# Patient Record
Sex: Female | Born: 1985 | Race: Black or African American | Hispanic: No | Marital: Single | Smoking: Never smoker
Health system: Southern US, Community
[De-identification: ages and names within clinical notes are randomized; demographics above are authoritative.]

## PROBLEM LIST (undated history)

## (undated) DIAGNOSIS — R6881 Early satiety: Secondary | ICD-10-CM

## (undated) DIAGNOSIS — L910 Hypertrophic scar: Secondary | ICD-10-CM

## (undated) DIAGNOSIS — R61 Generalized hyperhidrosis: Secondary | ICD-10-CM

## (undated) DIAGNOSIS — A6000 Herpesviral infection of urogenital system, unspecified: Secondary | ICD-10-CM

## (undated) DIAGNOSIS — D649 Anemia, unspecified: Secondary | ICD-10-CM

## (undated) DIAGNOSIS — A630 Anogenital (venereal) warts: Secondary | ICD-10-CM

## (undated) DIAGNOSIS — D509 Iron deficiency anemia, unspecified: Secondary | ICD-10-CM

## (undated) DIAGNOSIS — D696 Thrombocytopenia, unspecified: Secondary | ICD-10-CM

## (undated) DIAGNOSIS — R634 Abnormal weight loss: Secondary | ICD-10-CM

## (undated) DIAGNOSIS — R87629 Unspecified abnormal cytological findings in specimens from vagina: Secondary | ICD-10-CM

## (undated) HISTORY — DX: Iron deficiency anemia, unspecified: D50.9

## (undated) HISTORY — DX: Thrombocytopenia, unspecified: D69.6

## (undated) HISTORY — DX: Early satiety: R68.81

## (undated) HISTORY — DX: Generalized hyperhidrosis: R61

## (undated) HISTORY — DX: Abnormal weight loss: R63.4

## (undated) HISTORY — DX: Anemia, unspecified: D64.9

## (undated) HISTORY — PX: WISDOM TOOTH EXTRACTION: SHX21

---
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
## 2006-04-08 ENCOUNTER — Emergency Department (HOSPITAL_COMMUNITY): Admission: EM | Admit: 2006-04-08 | Discharge: 2006-04-09 | Payer: Self-pay | Admitting: Emergency Medicine

## 2007-04-10 ENCOUNTER — Ambulatory Visit: Payer: Self-pay | Admitting: Family Medicine

## 2007-04-10 DIAGNOSIS — Z9189 Other specified personal risk factors, not elsewhere classified: Secondary | ICD-10-CM | POA: Insufficient documentation

## 2007-04-10 HISTORY — DX: Other specified personal risk factors, not elsewhere classified: Z91.89

## 2007-04-13 ENCOUNTER — Encounter: Payer: Self-pay | Admitting: Family Medicine

## 2007-06-09 ENCOUNTER — Ambulatory Visit: Payer: Self-pay | Admitting: Family Medicine

## 2007-11-08 ENCOUNTER — Ambulatory Visit: Payer: Self-pay | Admitting: Family Medicine

## 2011-03-11 ENCOUNTER — Telehealth: Payer: Self-pay | Admitting: Family Medicine

## 2011-03-11 NOTE — Telephone Encounter (Signed)
Pts mom called. Pt was last seen in 2009 and is req to re-est. Pt has been having lower back pain for 2 days. Pls advise if ok to work in 30 min ov to re-est, or just a 15 min acute?

## 2011-03-11 NOTE — Telephone Encounter (Signed)
Okay for her to see me tomorrow afternoon for this. Give [place] 30 minutes please

## 2011-03-11 NOTE — Telephone Encounter (Signed)
Called pts mom and schd 30 min ov for pt on Friday 03/12/11 at 1:30 pm as noted per Dr Clent Ridges.

## 2011-03-12 ENCOUNTER — Encounter: Payer: Self-pay | Admitting: Family Medicine

## 2011-03-12 ENCOUNTER — Ambulatory Visit (INDEPENDENT_AMBULATORY_CARE_PROVIDER_SITE_OTHER): Payer: Managed Care, Other (non HMO) | Admitting: Family Medicine

## 2011-03-12 VITALS — BP 118/72 | HR 78 | Temp 98.8°F | Ht 61.0 in | Wt 113.0 lb

## 2011-03-12 DIAGNOSIS — M545 Low back pain: Secondary | ICD-10-CM

## 2011-03-12 MED ORDER — DICLOFENAC SODIUM 75 MG PO TBEC: 75.0000 mg | DELAYED_RELEASE_TABLET | Freq: Two times a day (BID) | ORAL | Status: DC | PRN

## 2011-03-12 NOTE — Progress Notes (Signed)
  Subjective:    Patient ID: Melinda [name], female    DOB: 08-11-1985, 26 y.o.   MRN: [id number]  HPI 26 yr old female to re-establish after an absence of 4 years. She is complaining of lower back pain for the past 2 weeks. This started rather suddenly, and she does not know what may have caused it. She is not working out and she has had no trauma. No UTI symptoms. She has done nothing for it. She is a Holiday representative at [employer] and she will be graduating soon with a degree in childhood development. 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    Review of Systems  Constitutional: Negative.   Respiratory: Negative.   Cardiovascular: Negative.   Gastrointestinal: Negative.   Genitourinary: Negative.   Musculoskeletal: Positive for back pain.       Objective:   Physical Exam  Constitutional: She appears well-developed and well-nourished.  Cardiovascular: Normal rate, regular rhythm, normal heart sounds and intact distal pulses.   Pulmonary/Chest: Effort normal and breath sounds normal.  Abdominal: Soft. Bowel sounds are normal. She exhibits no distension and no mass. There is no tenderness. There is no rebound and no guarding.  Musculoskeletal:       Tender in the upper lumbar area over the spine. Flexion and extension are limited by pain, but lateral and rotational ROM is full           Assessment & Plan:  Muscular low back pain. Use heat and Diclofenac. Suggested she do Pilates or core strengthening exercises. Recheck  prn

## 2011-10-29 ENCOUNTER — Ambulatory Visit (INDEPENDENT_AMBULATORY_CARE_PROVIDER_SITE_OTHER): Payer: Managed Care, Other (non HMO) | Admitting: Family Medicine

## 2011-10-29 ENCOUNTER — Encounter: Payer: Self-pay | Admitting: Family Medicine

## 2011-10-29 VITALS — BP 100/62 | HR 101 | Temp 99.7°F | Wt 110.0 lb

## 2011-10-29 DIAGNOSIS — B373 Candidiasis of vulva and vagina: Secondary | ICD-10-CM

## 2011-10-29 MED ORDER — FLUCONAZOLE 150 MG PO TABS: 150.0000 mg | ORAL_TABLET | Freq: Once | ORAL | Status: DC

## 2011-10-29 NOTE — Progress Notes (Signed)
  Subjective:    Patient ID: Melinda [name], female    DOB: Dec 03, 1985, 26 y.o.   MRN: [id number]  HPI Here for 2 days of itching around the perineal area. No vaginal DC or odor. Her LMP was 3 weeks ago. No fever or cramps. Her last Pap smear was at [facility] in October 2012.    Review of Systems  Constitutional: Negative.   Genitourinary: Negative for vaginal bleeding, vaginal discharge, vaginal pain, menstrual problem and pelvic pain.       Objective:   Physical Exam  Constitutional: She appears well-developed and well-nourished.  Genitourinary:       The labia and introitus are red and a bit inflamed but no DC is seen, no rashes, no odor           Assessment & Plan:  Candidiasis. Use Diflucan and OTC Monistat prn. She will set up a Pap smear soon.

## 2011-12-15 ENCOUNTER — Ambulatory Visit: Payer: Managed Care, Other (non HMO) | Admitting: Family Medicine

## 2011-12-23 ENCOUNTER — Encounter: Payer: Self-pay | Admitting: Obstetrics and Gynecology

## 2011-12-23 ENCOUNTER — Ambulatory Visit (INDEPENDENT_AMBULATORY_CARE_PROVIDER_SITE_OTHER): Payer: Commercial Indemnity | Admitting: Obstetrics and Gynecology

## 2011-12-23 VITALS — BP 110/70 | HR 68 | Ht 61.0 in | Wt 111.0 lb

## 2011-12-23 DIAGNOSIS — Z01419 Encounter for gynecological examination (general) (routine) without abnormal findings: Secondary | ICD-10-CM

## 2011-12-23 DIAGNOSIS — Z124 Encounter for screening for malignant neoplasm of cervix: Secondary | ICD-10-CM

## 2011-12-23 MED ORDER — DESOGESTREL-ETHINYL ESTRADIOL 0.15-30 MG-MCG PO TABS: 1.0000 | ORAL_TABLET | Freq: Every day | ORAL | Status: DC

## 2011-12-23 NOTE — Progress Notes (Signed)
Regular Periods: yes Mammogram: no  Monthly Breast Ex.: yes Exercise: no  Tetanus < 10 years: yes Seatbelts: yes  NI. Bladder Functn.: yes Abuse at home: no  Daily BM's: yes Stressful Work: no  Healthy Diet: yes Sigmoid-Colonoscopy: NO  Calcium: no Medical problems this year: NONE   LAST PAP:12/12   NL  Contraception: DESOGEN  Mammogram:  NO   PCP: DR. Clent Ridges  PMH: NO CHANGE  FMH: NO CHANGE  Last Bone Scan: NO  PT IS SINGLE

## 2011-12-23 NOTE — Patient Instructions (Addendum)
Take Aleve  2 with food twice a day starting the day before cramps are expected OR   Ibuprofen 200 mg  # 3 tablets with food every 6 hours to prevent cramps

## 2011-12-23 NOTE — Progress Notes (Signed)
Subjective:    Melinda [name] is a 26 y.o. female, G0P0, who presents for an annual exam. The patient reports seeing Dr. Campbell Stall for dermatologic issues (acne) and was placed in Desogen last month.    Menstrual cycle:   LMP: Patient's last menstrual period was 12/06/2011. Flow 7 days with pad change five times a day; cramps 8/10 starting 2 days before period and 1 day after period with no relief from OTC analgesia             Review of Systems Pertinent items are noted in HPI. Denies pelvic pain, urinary tract symptoms, vaginitis symptoms, irregular bleeding, menopausal symptoms, change in bowel habits or rectal bleeding   Objective:    BP 110/70  Pulse 68  Ht 5\' 1"  (1.549 m)  Wt 111 lb (50.349 kg)  BMI 20.97 kg/m2  LMP 12/06/2011   Wt Readings from Last 1 Encounters:  12/23/11 111 lb (50.349 kg)   Body mass index is 20.97 kg/(m^2). General Appearance: Alert, no acute distress HEENT: Grossly normal Neck / Thyroid: Supple, no thyromegaly or cervical adenopathy Lungs: Clear to auscultation bilaterally Back: No CVA tenderness Breast Exam: No masses or nodes.No dimpling, nipple retraction or discharge. Cardiovascular: Regular rate and rhythm.  Gastrointestinal: Soft, non-tender, no masses or organomegaly Pelvic Exam: EGBUS-wnl, vagina-normal rugae, cervix- posterior without lesions or tenderness, uterus appears normal size shape and consistency, adnexae-no masses or tenderness Lymphatic Exam: Non-palpable nodes in neck, clavicular,  axillary, or inguinal regions  Skin: no rashes or abnormalities Extremities: no clubbing cyanosis or edema  Neurologic: grossly normal Psychiatric: Alert and oriented   Assessment:   Routine GYN Exam Dysmenorrhea    Plan:    PAP sent  Continue Desogen #1 1 po qd 11 refills  RTO 1 year or prn  Mikkel Charrette,ELMIRAPA-C

## 2011-12-25 LAB — PAP IG, CT-NG, RFX HPV ASCU
Chlamydia Probe Amp: NEGATIVE
GC Probe Amp: NEGATIVE

## 2012-01-07 ENCOUNTER — Telehealth: Payer: Self-pay | Admitting: Obstetrics and Gynecology

## 2012-01-07 NOTE — Telephone Encounter (Signed)
Tc to pt per telephone call. Informed pt pap smear-wnl. GC/CT-both neg. Pt agrees.

## 2012-06-17 ENCOUNTER — Emergency Department (HOSPITAL_COMMUNITY)
Admission: EM | Admit: 2012-06-17 | Discharge: 2012-06-17 | Disposition: A | Discharge status: Home / Self Care | Payer: BC Managed Care – PPO | Source: Home / Self Care | Attending: Emergency Medicine | Admitting: Emergency Medicine

## 2012-06-17 ENCOUNTER — Encounter (HOSPITAL_COMMUNITY): Payer: Self-pay | Admitting: Emergency Medicine

## 2012-06-17 DIAGNOSIS — R6883 Chills (without fever): Secondary | ICD-10-CM

## 2012-06-17 NOTE — ED Notes (Signed)
Waiting discharge papers 

## 2012-06-17 NOTE — ED Provider Notes (Signed)
History     CSN: [id number]  Arrival date & time 06/17/12  1141   First MD Initiated Contact with Patient 06/17/12 1210      Chief Complaint  Patient presents with  . Chills    low grade temp. hot cold chills. fatigue    (Consider location/radiation/quality/duration/timing/severity/associated sxs/prior treatment) HPI Comments: Pt presents c/o subjective fever/chills and lack of appetite since yesterday.  She states that she can eat but is just not hungry and could only eat half of her biscuit this AM.  Denies abdominal pain, measured fever, sore throat, cough, CP, or any and all other symptoms.  She has not tried to take any OTC medicine to feel better.     Past Medical History  Diagnosis Date  . Chickenpox     History reviewed. No pertinent past surgical history.  Family History  Problem Relation Age of Onset  . Hypertension Maternal Grandmother   . Diabetes Maternal Grandmother   . Hypertension Mother   . Hypertension Maternal Uncle     History  Substance Use Topics  . Smoking status: Never Smoker   . Smokeless tobacco: Never Used  . Alcohol Use: No    OB History   Grav Para Term Preterm Abortions TAB SAB Ect Mult Living   0               Review of Systems  Constitutional: Positive for fever, chills and appetite change.  Eyes: Negative for visual disturbance.  Respiratory: Negative for cough and shortness of breath.   Cardiovascular: Negative for chest pain, palpitations and leg swelling.  Gastrointestinal: Negative for nausea, vomiting and abdominal pain.  Endocrine: Negative for polydipsia and polyuria.  Genitourinary: Negative for dysuria, urgency and frequency.  Musculoskeletal: Negative for myalgias and arthralgias.  Skin: Negative for rash.  Neurological: Negative for dizziness, weakness and light-headedness.    Allergies  Review of patient's allergies indicates no known allergies.  Home Medications   Current Outpatient Rx  Name  Route  Sig   Dispense  Refill  . desogestrel-ethinyl estradiol (APRI,EMOQUETTE,SOLIA) 0.15-30 MG-MCG tablet   Oral   Take 1 tablet by mouth daily.   1 Package   11   . desogestrel-ethinyl estradiol (APRI,EMOQUETTE,SOLIA) 0.15-30 MG-MCG tablet   Oral   Take 1 tablet by mouth daily.           BP 116/77  Pulse 108  Temp(Src) 99.6 F (37.6 C) (Oral)  Resp 19  SpO2 100%  LMP 05/17/2012  Physical Exam  Nursing note and vitals reviewed. Constitutional: She is oriented to person, place, and time. Vital signs are normal. She appears well-developed and well-nourished. No distress.  HENT:  Head: Atraumatic.  Eyes: EOM are normal. Pupils are equal, round, and reactive to light.  Cardiovascular: Normal rate, regular rhythm and normal heart sounds.  Exam reveals no gallop and no friction rub.   No murmur heard. Pulmonary/Chest: Effort normal and breath sounds normal. No respiratory distress. She has no wheezes. She has no rales.  Abdominal: Soft. There is no tenderness.  Neurological: She is alert and oriented to person, place, and time. She has normal strength.  Skin: Skin is warm and dry. She is not diaphoretic.  Psychiatric: She has a normal mood and affect. Her behavior is normal. Judgment normal.    ED Course  Procedures (including critical care time)  Labs Reviewed - No data to display No results found.   1. Chills (without fever)       MDM  PE completely normal.  Mild viral syndrome.  Pt will take tylenol PRN and f/u if anything changes.          Graylon Good, PA-C 06/17/12 1249

## 2012-06-17 NOTE — ED Notes (Signed)
Pt reports low grade temp, hot cold chills and fatigue since yesterday.  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  Denies any other symptoms. Pt has not tried any otc meds for treatment.

## 2012-06-17 NOTE — ED Provider Notes (Signed)
Medical screening examination/treatment/procedure(s) were performed by non-physician practitioner and as supervising physician I was immediately available for consultation/collaboration.  Leslee Home, M.D.  Reuben Likes, MD 06/17/12 [phone]

## 2012-06-21 ENCOUNTER — Encounter: Payer: Self-pay | Admitting: Family Medicine

## 2012-06-21 ENCOUNTER — Ambulatory Visit (INDEPENDENT_AMBULATORY_CARE_PROVIDER_SITE_OTHER): Payer: BC Managed Care – PPO | Admitting: Family Medicine

## 2012-06-21 VITALS — BP 110/66 | Temp 98.9°F | Wt 103.0 lb

## 2012-06-21 DIAGNOSIS — E041 Nontoxic single thyroid nodule: Secondary | ICD-10-CM

## 2012-06-21 DIAGNOSIS — R634 Abnormal weight loss: Secondary | ICD-10-CM

## 2012-06-21 DIAGNOSIS — E049 Nontoxic goiter, unspecified: Secondary | ICD-10-CM

## 2012-06-21 DIAGNOSIS — E010 Iodine-deficiency related diffuse (endemic) goiter: Secondary | ICD-10-CM

## 2012-06-21 LAB — BASIC METABOLIC PANEL
BUN: 11 mg/dL (ref 6–23)
CO2: 23 mEq/L (ref 19–32)
Calcium: 9.5 mg/dL (ref 8.4–10.5)
Chloride: 110 mEq/L (ref 96–112)
Creatinine, Ser: 0.8 mg/dL (ref 0.4–1.2)
GFR: 106.59 mL/min (ref >60.00)
Glucose, Bld: 75 mg/dL (ref 70–99)
Potassium: 4.2 mEq/L (ref 3.5–5.1)
Sodium: 141 mEq/L (ref 135–145)

## 2012-06-21 LAB — HEPATIC FUNCTION PANEL
ALT: 15 U/L (ref 0–35)
AST: 16 U/L (ref 0–37)
Albumin: 3.7 g/dL (ref 3.5–5.2)
Alkaline Phosphatase: 37 U/L — ABNORMAL LOW (ref 39–117)
Bilirubin, Direct: 0.1 mg/dL (ref 0.0–0.3)
Total Bilirubin: 0.6 mg/dL (ref 0.3–1.2)
Total Protein: 6.9 g/dL (ref 6.0–8.3)

## 2012-06-21 LAB — CBC WITH DIFFERENTIAL/PLATELET
Basophils Absolute: 0 10*3/uL (ref 0.0–0.1)
Eosinophils Relative: 1.2 % (ref 0.0–5.0)
HCT: 33.8 % — ABNORMAL LOW (ref 36.0–46.0)
Hemoglobin: 10.8 g/dL — ABNORMAL LOW (ref 12.0–15.0)
Lymphs Abs: 1.8 10*3/uL (ref 0.7–4.0)
MCV: 82 fl (ref 78.0–100.0)
Monocytes Absolute: 0.3 10*3/uL (ref 0.1–1.0)
Neutro Abs: 1.2 10*3/uL — ABNORMAL LOW (ref 1.4–7.7)
Platelets: 67 10*3/uL — ABNORMAL LOW (ref 150.0–400.0)
RDW: 22.9 % — ABNORMAL HIGH (ref 11.5–14.6)

## 2012-06-21 LAB — T3, FREE: T3, Free: 3.3 pg/mL (ref 2.3–4.2)

## 2012-06-21 LAB — TSH: TSH: 0.89 u[IU]/mL (ref 0.35–5.50)

## 2012-06-21 NOTE — Addendum Note (Signed)
Addended by: Rita Ohara R on: 06/21/2012 03:49 PM   Modules accepted: Orders

## 2012-06-21 NOTE — Progress Notes (Signed)
  Subjective:    Patient ID: Melinda [name], female    DOB: March 05, 1985, 27 y.o.   MRN: [id number]  HPI Here with her mother and grandmother to ask about weight loss. Over the past few months she thinks she has lost weight with no explanation. She does not weigh herself but all her clothes fit her loosely. Her appetite is diminshed. She has frequent heartburn but no nausea. No change in BMs. She went to the ER last weekend for fatigue and chills and had a normal exam. No labs we done. They told her she had a "virus". She also notes some swelling and some mild discomfort in the anterior neck. She takes an OTC iron pill daily but no other supplements or energy drinks. Her GM apparently had a overactive thyroid when in her 63s.    Review of Systems  Constitutional: Positive for chills, appetite change, fatigue and unexpected weight change. Negative for fever, diaphoresis and activity change.  HENT: Positive for neck pain. Negative for neck stiffness.   Respiratory: Negative.   Cardiovascular: Negative.   Gastrointestinal: Negative.   Endocrine: Negative.   Genitourinary: Negative.        Objective:   Physical Exam  Constitutional: She appears well-developed and well-nourished. No distress.  Eyes: Conjunctivae are normal.  Neck: Normal range of motion. Neck supple.  Mild enlargement of the thyroid with the right lobe a little larger than the left. Slightly tender diffusely. No nodules   Cardiovascular: Normal rate, regular rhythm, normal heart sounds and intact distal pulses.   No murmur heard. Pulmonary/Chest: Effort normal and breath sounds normal.  Abdominal: Soft. Bowel sounds are normal. She exhibits no distension and no mass. There is no tenderness. There is no rebound and no guarding.  Lymphadenopathy:    She has no cervical adenopathy.          Assessment & Plan:  She likely has thyroid disease. Get labs today including a full thyroid panel and set up a thyroid US soon. Use  Prilosec OTC for GERD symptoms.

## 2012-06-23 ENCOUNTER — Telehealth: Payer: Self-pay | Admitting: Family Medicine

## 2012-06-23 DIAGNOSIS — D61818 Other pancytopenia: Secondary | ICD-10-CM

## 2012-06-23 NOTE — Telephone Encounter (Signed)
I spoke with pt and gave results.  

## 2012-06-23 NOTE — Telephone Encounter (Signed)
Her labs show normal thryoid levels but all her cell counts are down (WBC, RBC, and platelets). It is not clear why or whether this is related to her weight loss. I will refer her to Hematology to evaluate further.

## 2012-06-26 ENCOUNTER — Telehealth: Payer: Self-pay | Admitting: Oncology

## 2012-06-26 NOTE — Telephone Encounter (Signed)
S/W PT IN REF TO NP APPT. ON 07/17/12@3 :00 REFERRING DR Clent Ridges DX-PANCYTOPENIA MAILED NP PACKET

## 2012-06-26 NOTE — Telephone Encounter (Signed)
C/D 06/26/12 for appt. 07/17/12

## 2012-06-30 ENCOUNTER — Ambulatory Visit
Admission: RE | Admit: 2012-06-30 | Discharge: 2012-06-30 | Disposition: A | Discharge status: Home / Self Care | Payer: BC Managed Care – PPO | Source: Ambulatory Visit | Attending: Family Medicine | Admitting: Family Medicine

## 2012-06-30 DIAGNOSIS — E010 Iodine-deficiency related diffuse (endemic) goiter: Secondary | ICD-10-CM

## 2012-07-05 NOTE — Progress Notes (Signed)
Quick Note:  I spoke with pt ______ 

## 2012-07-05 NOTE — Addendum Note (Signed)
Addended by: Gershon Crane A on: 07/05/2012 01:05 PM   Modules accepted: Orders

## 2012-07-17 ENCOUNTER — Telehealth: Payer: Self-pay | Admitting: Oncology

## 2012-07-17 ENCOUNTER — Encounter: Payer: Self-pay | Admitting: Oncology

## 2012-07-17 ENCOUNTER — Other Ambulatory Visit (HOSPITAL_BASED_OUTPATIENT_CLINIC_OR_DEPARTMENT_OTHER): Payer: BC Managed Care – PPO | Admitting: Lab

## 2012-07-17 ENCOUNTER — Ambulatory Visit (HOSPITAL_BASED_OUTPATIENT_CLINIC_OR_DEPARTMENT_OTHER): Payer: BC Managed Care – PPO | Admitting: Oncology

## 2012-07-17 ENCOUNTER — Ambulatory Visit (HOSPITAL_BASED_OUTPATIENT_CLINIC_OR_DEPARTMENT_OTHER): Payer: BC Managed Care – PPO

## 2012-07-17 VITALS — BP 127/76 | HR 90 | Temp 99.9°F | Resp 20 | Ht 61.0 in | Wt 105.6 lb

## 2012-07-17 DIAGNOSIS — D61818 Other pancytopenia: Secondary | ICD-10-CM

## 2012-07-17 LAB — CBC & DIFF AND RETIC
BASO%: 0.3 % (ref 0.0–2.0)
Basophils Absolute: 0 10*3/uL (ref 0.0–0.1)
EOS%: 1.7 % (ref 0.0–7.0)
HCT: 32.9 % — ABNORMAL LOW (ref 34.8–46.6)
HGB: 10.3 g/dL — ABNORMAL LOW (ref 11.6–15.9)
Immature Retic Fract: 10.8 % — ABNORMAL HIGH (ref 1.60–10.00)
MCH: 25.5 pg (ref 25.1–34.0)
MONO#: 0.3 10*3/uL (ref 0.1–0.9)
NEUT#: 1.8 10*3/uL (ref 1.5–6.5)
NEUT%: 50 % (ref 38.4–76.8)
RDW: 17.3 % — ABNORMAL HIGH (ref 11.2–14.5)
Retic Ct Abs: 31.11 10*3/uL — ABNORMAL LOW (ref 33.70–90.70)
WBC: 3.5 10*3/uL — ABNORMAL LOW (ref 3.9–10.3)
lymph#: 1.4 10*3/uL (ref 0.9–3.3)

## 2012-07-17 LAB — LACTATE DEHYDROGENASE (CC13): LDH: 140 U/L (ref 125–245)

## 2012-07-17 LAB — CHCC SMEAR

## 2012-07-17 NOTE — Progress Notes (Signed)
Checked in new patient. No financial issues. She wants phone and mail only. I didn't ask if POA/living will.

## 2012-07-17 NOTE — Progress Notes (Signed)
[facility]  [phone] [phone]     INITIAL HEMATOLOGY CONSULTATION    Referral MD:  Gershon Crane, M.D.  Reason for Referral: anemia, thrombocytopenia.     HPI:  Melinda [name] is a 27 year-old woman with no significant PMH.  He was in [facility] until a few months ago when she developed anorexia, weight loss, early satiety.  She presented to her PCP.  CBC was obtained which showed WBC 3.3, Hgb 10.8; MCV 82; Plt 67.  She was kindly referred to the [facility] for evaluation.  Melinda [name] presented for the first time today at the [facility] with there mother and grandmother.  She reported that for the last few months, she has had no appetite.  She has lost about 10-20 lb non intentionally. Her thyroid function was tested and was normal.  She was found to have 3 x 4 mm left lower pole nodule in the thyroid. She was referred to endocrinology.  She also reported profuse night sweat.  She denied any palpable adenopathy.  She has fatigue as well but has been independent of all activities of daily living.  She reported menometrorrhagia.  Her cycle lasts for 7 days; heavy first 2 days; when she changes every 2 hours. She has been taking oral iron but with abdominal cramp and constipation.   Patient denies fever, headache, visual changes, confusion, mucositis, odynophagia, dysphagia, nausea vomiting, jaundice, chest pain, palpitation, shortness of breath, dyspnea on exertion, productive cough, gum bleeding, epistaxis, hematemesis, hemoptysis, abdominal pain, abdominal swelling, melena, hematochezia, hematuria, skin rash, spontaneous bleeding, joint swelling, joint pain, heat or cold intolerance, bowel bladder incontinence, back pain, focal motor weakness, paresthesia, depression.       Past Medical History  Diagnosis Date  . Chickenpox   :    No past surgical history on file.:   CURRENT MEDS: Current Outpatient Prescriptions  Medication Sig  Dispense Refill  . desogestrel-ethinyl estradiol (APRI,EMOQUETTE,SOLIA) 0.15-30 MG-MCG tablet Take 1 tablet by mouth daily.       No current facility-administered medications for this visit.      No Known Allergies:  Family History  Problem Relation Age of Onset  . Hypertension Maternal Grandmother   . Diabetes Maternal Grandmother   . Hypertension Mother   . Hypertension Maternal Uncle   . Other Father     Leukopenia.  :  History   Social History  . Marital Status: Single    Spouse Name: N/A    Number of Children: 0  . Years of Education: N/A   Occupational History  .      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 Smoking status: Never Smoker   . Smokeless tobacco: Never Used  . Alcohol Use: No  . Drug Use: No  . Sexually Active: Not Currently    Birth Control/ Protection: Pill     Comment: DESOGEN   (GEN)   Other Topics Concern  . Not on file   Social History Narrative  . No narrative on file  :  REVIEW OF SYSTEM:  The rest of the 14-point review of sytem was negative.   Exam: ECOG 1.   General:  Thin-appearing woman in no acute distress.  Eyes:  no scleral icterus.  ENT:  There were no oropharyngeal lesions.  Neck was without thyromegaly.  Lymphatics:  Negative cervical, supraclavicular or axillary adenopathy.  Respiratory: lungs were clear bilaterally without wheezing or crackles.  Cardiovascular:  Regular rate and rhythm, S1/S2, without murmur, rub or gallop.  There was no pedal edema.  GI:  abdomen was soft, flat, nontender, nondistended, without organomegaly.  Muscoloskeletal:  no spinal tenderness of palpation of vertebral spine.  Skin exam was without echymosis, petichae.  Neuro exam was nonfocal.  Patient was able to get on and off exam table without assistance.  Gait was normal.  Patient was alert and oriented.  Attention was good.   Language was appropriate.  Mood was normal without depression.  Speech was not pressured.  Thought  content was not tangential.    LABS:  Lab Results  Component Value Date   WBC 3.3* 06/21/2012   HGB 10.8* 06/21/2012   HCT 33.8* 06/21/2012   PLT 67.0 Repeated and verified X2.* 06/21/2012   GLUCOSE 75 06/21/2012   ALT 15 06/21/2012   AST 16 06/21/2012   NA 141 06/21/2012   K 4.2 06/21/2012   CL 110 06/21/2012   CREATININE 0.8 06/21/2012   BUN 11 06/21/2012   CO2 23 06/21/2012    US Soft Tissue Head/neck  06/30/2012   *RADIOLOGY REPORT*  Clinical Data: Thyroid enlargement.  Weight loss  THYROID ULTRASOUND  Technique: Ultrasound examination of the thyroid gland and adjacent soft tissues was performed.  Comparison:  None.  Findings:  Right thyroid lobe:  5.4 x 1.3 x 1.2 cm.  Homogeneous echotexture Left thyroid lobe:  4.4 x 1.3 x 1.4 cm.  Homogeneous echotexture. Isthmus:  0.4 cm.  Focal nodules:  4 x 3 mm hypoechoic nodule left lower pole.  Lymphadenopathy:  No enlarged lymph nodes.  Left submandibular node measures 5.9 mm.  Left lateral node measures 3 mm  IMPRESSION: 3 x 4 mm left lower pole nodule.  Otherwise negative   Original Report Authenticated By: Janeece Riggers, M.D.    Blood smear review:   I personally reviewed the patient's peripheral blood smear today.  There was isocytosis.  There was no peripheral blast.  There was no schistocytosis, spherocytosis, target cell, rouleaux formation, tear drop cell.  There was no platelet clumps.  There were occasional giant platelets.     ASSESSMENT AND PLAN:   1 . Mild microcytic anemia:  - Lab test most consistent with iron deficiency anemia from menometrorrhagia.  Her Vit B12 was normal.  There was no sign of hemolysis on lab work up.   - As she is having problem tolerating oral iron, I recommended IV iron (Feraheme) here at the [facility] within the next week or so.  Feraheme should improve your hemoglobin within the next few weeks. Feraheme is given intravenously over 15-30 minutes.  It is commonly well tolerated.  Some patients have infusion  reaction with shortness of breath, wheezing, muscle/bone pain.  She expressed informed understanding and wished to proceed.  - I advised her to continue to take oral iron to improve iron store.  Consider either NuIron or SlowFe which tend to be better tolerated than generic brands.  - I advised her to discuss with her Gynecologist to see if there are other options for birth control since she still has menometrorrhagia.  - Recheck CBC in about 1 month to ensure improvement of her Hgb.   2.  Thrombocytopenia:   - Unclear etiology.  She was ruled out for Vit B12 deficiency, autoimmune disease, liver abnormality.  She reported that her HIV test was negative last year.  This is less likely to be ITP since patients with ITP normally has much worse thrombocytopenia.  -  Her mother also has thrombocytopenia at baseline.  - I will recheck her CBC in the future and consider bone marrow biopsy if her platelet continues to drop to less than 50.    3.  Anorexia, early satiety, weight loss and night sweat:  I need to rule out occult lymphoma.  I requested CT chest/abd/pel within the next 10 days.    4 . Follow up:  In about 3 months.    I informed Melinda [name] that I am leaving the practice.  [facility] will arrange for her to see another provider when she returns.     The length of time of the face-to-face encounter was 45 minutes. More than 50% of time was spent counseling and coordination of care.     Thank you for this referral.

## 2012-07-17 NOTE — Telephone Encounter (Signed)
Gave pt appt for lab and MD on October 2014 per Dr. Gaylyn Rong

## 2012-07-17 NOTE — Patient Instructions (Signed)
1.  Issue:  Low white count (leukopenia), anemia, and low platelet count (thrombocytopenia). 2.  Potential causes:  Iron deficiency anemia from heavy menstrual cycle and low Vit B12 causing low white and platelet count.  I will rule out autoimmune disease.  This can also be hereditary since your father have low white count and mother with low platelet count.  3.  If these work up are negative, I will recommend a bone marrow biopsy to rule out bone marrow problem. 4.  Follow up:  In about 3 months if work up are negative.

## 2012-07-18 LAB — IRON AND TIBC CHCC
%SAT: 19 % — ABNORMAL LOW (ref 21–57)
UIBC: 360 ug/dL (ref 120–384)

## 2012-07-19 ENCOUNTER — Encounter: Payer: Self-pay | Admitting: Oncology

## 2012-07-19 ENCOUNTER — Other Ambulatory Visit: Payer: Self-pay | Admitting: Oncology

## 2012-07-19 ENCOUNTER — Telehealth: Payer: Self-pay | Admitting: *Deleted

## 2012-07-19 ENCOUNTER — Encounter: Payer: Self-pay | Admitting: Family Medicine

## 2012-07-19 DIAGNOSIS — D696 Thrombocytopenia, unspecified: Secondary | ICD-10-CM

## 2012-07-19 DIAGNOSIS — D649 Anemia, unspecified: Secondary | ICD-10-CM | POA: Insufficient documentation

## 2012-07-19 DIAGNOSIS — R6881 Early satiety: Secondary | ICD-10-CM | POA: Insufficient documentation

## 2012-07-19 DIAGNOSIS — R634 Abnormal weight loss: Secondary | ICD-10-CM | POA: Insufficient documentation

## 2012-07-19 DIAGNOSIS — D509 Iron deficiency anemia, unspecified: Secondary | ICD-10-CM

## 2012-07-19 DIAGNOSIS — R61 Generalized hyperhidrosis: Secondary | ICD-10-CM

## 2012-07-19 NOTE — Telephone Encounter (Signed)
Pt called for recent lab results and asks if she needs any treatment?

## 2012-07-20 ENCOUNTER — Telehealth: Payer: Self-pay | Admitting: Oncology

## 2012-07-20 NOTE — Telephone Encounter (Signed)
, °

## 2012-07-21 ENCOUNTER — Telehealth: Payer: Self-pay | Admitting: *Deleted

## 2012-07-21 ENCOUNTER — Telehealth: Payer: Self-pay | Admitting: Oncology

## 2012-07-21 NOTE — Telephone Encounter (Signed)
Per staff message and POF I have scheduled appts.  JMW  

## 2012-07-26 ENCOUNTER — Encounter: Payer: Self-pay | Admitting: Endocrinology

## 2012-07-26 ENCOUNTER — Ambulatory Visit (INDEPENDENT_AMBULATORY_CARE_PROVIDER_SITE_OTHER): Payer: BC Managed Care – PPO | Admitting: Endocrinology

## 2012-07-26 ENCOUNTER — Ambulatory Visit (HOSPITAL_COMMUNITY)
Admission: RE | Admit: 2012-07-26 | Discharge: 2012-07-26 | Disposition: A | Discharge status: Home / Self Care | Payer: BC Managed Care – PPO | Source: Ambulatory Visit | Attending: Oncology | Admitting: Oncology

## 2012-07-26 ENCOUNTER — Encounter (HOSPITAL_COMMUNITY): Payer: Self-pay

## 2012-07-26 ENCOUNTER — Encounter: Payer: Self-pay | Admitting: Oncology

## 2012-07-26 VITALS — BP 104/62 | HR 102 | Temp 98.3°F | Resp 10 | Ht 62.0 in | Wt 104.0 lb

## 2012-07-26 DIAGNOSIS — N289 Disorder of kidney and ureter, unspecified: Secondary | ICD-10-CM | POA: Insufficient documentation

## 2012-07-26 DIAGNOSIS — D696 Thrombocytopenia, unspecified: Secondary | ICD-10-CM | POA: Insufficient documentation

## 2012-07-26 DIAGNOSIS — R634 Abnormal weight loss: Secondary | ICD-10-CM

## 2012-07-26 DIAGNOSIS — L989 Disorder of the skin and subcutaneous tissue, unspecified: Secondary | ICD-10-CM | POA: Insufficient documentation

## 2012-07-26 DIAGNOSIS — E042 Nontoxic multinodular goiter: Secondary | ICD-10-CM

## 2012-07-26 DIAGNOSIS — D649 Anemia, unspecified: Secondary | ICD-10-CM | POA: Insufficient documentation

## 2012-07-26 DIAGNOSIS — R61 Generalized hyperhidrosis: Secondary | ICD-10-CM | POA: Insufficient documentation

## 2012-07-26 DIAGNOSIS — R6881 Early satiety: Secondary | ICD-10-CM | POA: Insufficient documentation

## 2012-07-26 MED ORDER — IOHEXOL 300 MG/ML  SOLN
100.0000 mL | Freq: Once | INTRAMUSCULAR | Status: AC | PRN
  Administered 2012-07-26: 100 mL via INTRAVENOUS

## 2012-07-26 NOTE — Progress Notes (Signed)
Subjective:    Patient ID: Melinda [name], female    DOB: 07/06/1985, 27 y.o.   MRN: [id number]  HPI 1 month ago, pt was noted to have slight swelling at the anterior neck, and assoc weight loss.   Past Medical History  Diagnosis Date  . Chickenpox   . Iron deficiency anemia   . Night sweat   . Weight loss, non-intentional   . Early satiety   . Thrombocytopenia   . Anemia     History reviewed. No pertinent past surgical history.  History   Social History  . Marital Status: Single    Spouse Name: N/A    Number of Children: 0  . Years of Education: N/A   Occupational History  .      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 Smoking status: Never Smoker   . Smokeless tobacco: Never Used  . Alcohol Use: No  . Drug Use: No  . Sexually Active: Not Currently    Birth Control/ Protection: Pill     Comment: DESOGEN   (GEN)   Other Topics Concern  . Not on file   Social History Narrative  . No narrative on file    Current Outpatient Prescriptions on File Prior to Visit  Medication Sig Dispense Refill  . desogestrel-ethinyl estradiol (APRI,EMOQUETTE,SOLIA) 0.15-30 MG-MCG tablet Take 1 tablet by mouth daily.       No current facility-administered medications on file prior to visit.    No Known Allergies  Family History  Problem Relation Age of Onset  . Hypertension Maternal Grandmother   . Diabetes Maternal Grandmother   . Hypertension Mother   . Hypertension Maternal Uncle   . Other Father     Leukopenia.  grandmother takes unknown type of thyroid pill  BP 104/62  Pulse 102  Temp(Src) 98.3 F (36.8 C) (Oral)  Resp 10  Ht 5\' 2"  (1.575 m)  Wt 104 lb (47.174 kg)  BMI 19.02 kg/m2  SpO2 98%  LMP 07/23/2012    Review of Systems denies neck pain, headache, hoarseness, double vision, palpitations, sob, diarrhea, myalgias, excessive diaphoresis, numbness, and hypoglycemia.  She has night sweats, urinary frequency, tremor, anxiety, easy  bruising, nasal congestion, and decreased appetite.     Objective:   Physical Exam VS: see vs page GEN: no distress HEAD: head: no deformity eyes: no periorbital swelling, no proptosis external nose and ears are normal mouth: no lesion seen NECK: supple, thyroid is not enlarged CHEST WALL: no deformity LUNGS:  Clear to auscultation CV: reg rate and rhythm, no murmur ABD: abdomen is soft, nontender.  no hepatosplenomegaly.  not distended.  no hernia.   MUSCULOSKELETAL: muscle bulk and strength are grossly normal.  no obvious joint swelling.  gait is normal and steady EXTEMITIES: no deformity.  no ulcer on the feet.  feet are of normal color and temp.  no edema PULSES: dorsalis pedis intact bilat.  no carotid bruit NEURO:  cn 2-12 grossly intact.   readily moves all 4's.  sensation is intact to touch on the feet SKIN:  Normal texture and temperature.  No rash or suspicious lesion is visible.   NODES:  None palpable at the neck PSYCH: alert, oriented x3.  Does not appear anxious nor depressed.  Lab Results  Component Value Date   TSH 0.89 06/21/2012   (i reviewed [place] report)    Assessment & Plan:  Small multinodular goiter.  She only needs repeat physical exam of the  neck in 1 year. Weight loss, not thyroid-related Night sweats, not thyroid-related

## 2012-07-26 NOTE — Patient Instructions (Addendum)
Your thyroid should be rechecked in 1 year.  This would mean Dr Clent Ridges or NP Lowell Guitar examining your neck then.  Some recommend rechecking the ultrasound also.   I would be happy to see you back here whenever you want.   In view of your normal thyroid blood test, you should conclude that your symptoms are not coming from the thyroid.

## 2012-07-27 ENCOUNTER — Ambulatory Visit (HOSPITAL_BASED_OUTPATIENT_CLINIC_OR_DEPARTMENT_OTHER): Payer: BC Managed Care – PPO

## 2012-07-27 VITALS — BP 95/53 | HR 87 | Temp 98.7°F

## 2012-07-27 DIAGNOSIS — D509 Iron deficiency anemia, unspecified: Secondary | ICD-10-CM

## 2012-07-27 DIAGNOSIS — D508 Other iron deficiency anemias: Secondary | ICD-10-CM

## 2012-07-27 DIAGNOSIS — E042 Nontoxic multinodular goiter: Secondary | ICD-10-CM | POA: Insufficient documentation

## 2012-07-27 DIAGNOSIS — N920 Excessive and frequent menstruation with regular cycle: Secondary | ICD-10-CM

## 2012-07-27 MED ORDER — DIPHENHYDRAMINE HCL 25 MG PO TABS
25.0000 mg | ORAL_TABLET | Freq: Once | ORAL | Status: AC
  Administered 2012-07-27: 25 mg via ORAL
  Filled 2012-07-27: qty 1

## 2012-07-27 MED ORDER — HEPARIN SOD (PORK) LOCK FLUSH 100 UNIT/ML IV SOLN
250.0000 [IU] | Freq: Once | INTRAVENOUS | Status: DC | PRN
  Filled 2012-07-27: qty 5

## 2012-07-27 MED ORDER — ACETAMINOPHEN 325 MG PO TABS
650.0000 mg | ORAL_TABLET | Freq: Once | ORAL | Status: AC
  Administered 2012-07-27: 650 mg via ORAL

## 2012-07-27 MED ORDER — SODIUM CHLORIDE 0.9 % IV SOLN
1020.0000 mg | Freq: Once | INTRAVENOUS | Status: AC
  Administered 2012-07-27: 1020 mg via INTRAVENOUS
  Filled 2012-07-27: qty 34

## 2012-07-27 MED ORDER — SODIUM CHLORIDE 0.9 % IV SOLN
Freq: Once | INTRAVENOUS | Status: AC
  Administered 2012-07-27: 15:00:00 via INTRAVENOUS

## 2012-07-27 NOTE — Patient Instructions (Addendum)
Ferumoxytol injection What is this medicine? FERUMOXYTOL is an iron complex. Iron is used to make healthy red blood cells, which carry oxygen and nutrients throughout the body. This medicine is used to treat iron deficiency anemia in people with chronic kidney disease. This medicine may be used for other purposes; ask your health care provider or pharmacist if you have questions. What should I tell my health care provider before I take this medicine? They need to know if you have any of these conditions: -anemia not caused by low iron levels -high levels of iron in the blood -magnetic resonance imaging (MRI) test scheduled -an unusual or allergic reaction to iron, other medicines, foods, dyes, or preservatives -pregnant or trying to get pregnant -breast-feeding How should I use this medicine? This medicine is for infusion into a vein. It is given by a health care professional in a hospital or clinic setting. Talk to your pediatrician regarding the use of this medicine in children. Special care may be needed. Overdosage: If you think you've taken too much of this medicine contact a poison control center or emergency room at once. Overdosage: If you think you have taken too much of this medicine contact a poison control center or emergency room at once. NOTE: This medicine is only for you. Do not share this medicine with others. What if I miss a dose? It is important not to miss your dose. Call your doctor or health care professional if you are unable to keep an appointment. What may interact with this medicine? This medicine may interact with the following medications: -other iron products This list may not describe all possible interactions. Give your health care provider a list of all the medicines, herbs, non-prescription drugs, or dietary supplements you use. Also tell them if you smoke, drink alcohol, or use illegal drugs. Some items may interact with your medicine. What should I watch  for while using this medicine? Visit your doctor or healthcare professional regularly. Tell your doctor or healthcare professional if your symptoms do not start to get better or if they get worse. You may need blood work done while you are taking this medicine. You may need to follow a special diet. Talk to your doctor. Foods that contain iron include: whole grains/cereals, dried fruits, beans, or peas, leafy green vegetables, and organ meats (liver, kidney). What side effects may I notice from receiving this medicine? Side effects that you should report to your doctor or health care professional as soon as possible: -allergic reactions like skin rash, itching or hives, swelling of the face, lips, or tongue -breathing problems -changes in blood pressure -feeling faint or lightheaded, falls -fever or chills -flushing, sweating, or hot feelings -swelling of the ankles or feet Side effects that usually do not require medical attention (Report these to your doctor or health care professional if they continue or are bothersome.): -diarrhea -headache -nausea, vomiting -stomach pain This list may not describe all possible side effects. Call your doctor for medical advice about side effects. You may report side effects to FDA at 1-800-FDA-1088. Where should I keep my medicine? This drug is given in a hospital or clinic and will not be stored at home. NOTE: This sheet is a summary. It may not cover all possible information. If you have questions about this medicine, talk to your doctor, pharmacist, or health care provider.  2013, Elsevier/Gold Standard. (09/20/2007 9:48:25 PM)  

## 2012-07-31 ENCOUNTER — Telehealth: Payer: Self-pay | Admitting: *Deleted

## 2012-07-31 ENCOUNTER — Other Ambulatory Visit: Payer: Self-pay | Admitting: Oncology

## 2012-07-31 MED ORDER — METHYLPREDNISOLONE (PAK) 4 MG PO TABS: ORAL_TABLET | ORAL | Status: DC

## 2012-07-31 NOTE — Telephone Encounter (Signed)
Pt reports got IV Iron (faraheme) on Thurs /17.  She was fine until Sat 7/19 when she woke up w/ hives on her neck and arms.  Sunday 7/20, the hives and itching were a little worse and then this morning 7/21 the hives are covering her entire body, arms, legs, torso and very itchy. She has been taking benadryl q 6 hrs w/o much relief.  Notified Clenton Pare, NP and she rx'd Prednisone taper pack and use hydrocortisone cream OTC prn.   Informed pt of new Rx for Prednisone, follow instructions for taper on package,  Take w/ food and may start tonight, but it will likely cause insomnia this close to bedtime.  She can also start in the morning.  Also instructed may use hydrocortisone on hives prn as directed on package.  Call PCP if hives do not improve or worsen.  Go to ED if any sob or swelling of lips/tongue.  Pt denies any sob or tongue/lip swelling.  She verbalized understanding.

## 2012-08-16 ENCOUNTER — Other Ambulatory Visit (HOSPITAL_BASED_OUTPATIENT_CLINIC_OR_DEPARTMENT_OTHER): Payer: BC Managed Care – PPO | Admitting: Lab

## 2012-08-16 DIAGNOSIS — D696 Thrombocytopenia, unspecified: Secondary | ICD-10-CM

## 2012-08-16 DIAGNOSIS — D649 Anemia, unspecified: Secondary | ICD-10-CM

## 2012-08-16 LAB — CBC WITH DIFFERENTIAL/PLATELET
BASO%: 0.3 % (ref 0.0–2.0)
EOS%: 2.6 % (ref 0.0–7.0)
LYMPH%: 38.3 % (ref 14.0–49.7)
MCH: 28 pg (ref 25.1–34.0)
MCHC: 31.7 g/dL (ref 31.5–36.0)
MONO#: 0.3 10*3/uL (ref 0.1–0.9)
Platelets: 51 10*3/uL — ABNORMAL LOW (ref 145–400)
RBC: 4.32 10*6/uL (ref 3.70–5.45)
nRBC: 0 % (ref 0–0)

## 2012-08-18 ENCOUNTER — Telehealth: Payer: Self-pay

## 2012-08-18 NOTE — Telephone Encounter (Signed)
Message copied by Melinda [name] on Fri Aug 18, 2012 10:06 AM ------      Message from: Clenton Pare R      Created: Wed Aug 16, 2012 12:42 PM       Please call patient. Hgb is now normal. Plt are still low, but no additional intervention is needed at this time. Recommend continue observation. ------

## 2012-08-21 ENCOUNTER — Telehealth: Payer: Self-pay | Admitting: Family Medicine

## 2012-08-21 NOTE — Telephone Encounter (Signed)
I spoke with pt and she is going to ask the pharmacist about over the counter medication, she has never used the patch before.

## 2012-08-21 NOTE — Telephone Encounter (Signed)
Advise OTC medications. Has she used patch before? If so can rx one patch 1.5mg  scopolamine transdermal to apply 4 hours before cruise. She should discuss risks with pharmacist.

## 2012-08-21 NOTE — Telephone Encounter (Signed)
PT is calling to request a patch for sea sickness be sent into [facility] on [address]. She is leaving this Wednesday 8/13  Please assist.

## 2012-10-12 ENCOUNTER — Telehealth: Payer: Self-pay | Admitting: Hematology and Oncology

## 2012-10-12 NOTE — Telephone Encounter (Signed)
Moved 10/7 appt from CP2 to NG and s/w re coming in 10/7 @ 2:45pm. Pt cannot do PM appts. Gave pt new appt d/t for lb/NG 10/14 @ 8am.

## 2012-10-17 ENCOUNTER — Ambulatory Visit: Payer: BC Managed Care – PPO | Admitting: Hematology and Oncology

## 2012-10-17 ENCOUNTER — Other Ambulatory Visit: Payer: BC Managed Care – PPO | Admitting: Lab

## 2012-10-17 ENCOUNTER — Ambulatory Visit: Payer: BC Managed Care – PPO

## 2012-10-23 ENCOUNTER — Other Ambulatory Visit: Payer: Self-pay | Admitting: Hematology and Oncology

## 2012-10-23 DIAGNOSIS — D649 Anemia, unspecified: Secondary | ICD-10-CM

## 2012-10-23 DIAGNOSIS — D696 Thrombocytopenia, unspecified: Secondary | ICD-10-CM

## 2012-10-24 ENCOUNTER — Encounter (INDEPENDENT_AMBULATORY_CARE_PROVIDER_SITE_OTHER): Payer: Self-pay

## 2012-10-24 ENCOUNTER — Encounter: Payer: Self-pay | Admitting: Hematology and Oncology

## 2012-10-24 ENCOUNTER — Ambulatory Visit (HOSPITAL_BASED_OUTPATIENT_CLINIC_OR_DEPARTMENT_OTHER): Payer: BC Managed Care – PPO | Admitting: Lab

## 2012-10-24 ENCOUNTER — Ambulatory Visit (HOSPITAL_BASED_OUTPATIENT_CLINIC_OR_DEPARTMENT_OTHER): Payer: BC Managed Care – PPO | Admitting: Hematology and Oncology

## 2012-10-24 VITALS — BP 126/68 | HR 96 | Temp 98.8°F | Resp 20 | Ht 62.0 in | Wt 108.4 lb

## 2012-10-24 DIAGNOSIS — D696 Thrombocytopenia, unspecified: Secondary | ICD-10-CM

## 2012-10-24 DIAGNOSIS — D649 Anemia, unspecified: Secondary | ICD-10-CM

## 2012-10-24 DIAGNOSIS — N920 Excessive and frequent menstruation with regular cycle: Secondary | ICD-10-CM

## 2012-10-24 LAB — CBC WITH DIFFERENTIAL/PLATELET
BASO%: 0.5 % (ref 0.0–2.0)
Basophils Absolute: 0 10*3/uL (ref 0.0–0.1)
EOS%: 1 % (ref 0.0–7.0)
HGB: 11.7 g/dL (ref 11.6–15.9)
MCH: 31.4 pg (ref 25.1–34.0)
MCHC: 33.8 g/dL (ref 31.5–36.0)
MCV: 93 fL (ref 79.5–101.0)
MONO%: 3.9 % (ref 0.0–14.0)
RBC: 3.73 10*6/uL (ref 3.70–5.45)
RDW: 13.9 % (ref 11.2–14.5)
lymph#: 1.1 10*3/uL (ref 0.9–3.3)

## 2012-10-24 LAB — FERRITIN CHCC: Ferritin: 111 ng/ml (ref 9–269)

## 2012-10-24 NOTE — Progress Notes (Signed)
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 She also has night sweats weight loss menorrhagia and underwent extensive evaluation. She was also given 1 dose of intravenous iron on 07/27/2012 but developed allergic reaction in the form of a rash. Since her iron infusion, she has improved in her energy level and her night sweats resolved. She's also eating better and able to gain some weight INTERVAL HISTORY: Melinda [name] 27 y.o. female returns for further followup regarding her hematology problem above. She still have heavy menstruation. She was placed on the birth control pill that she take for 21 days and once you stop she has profuse menorrhagia for about 10 days with the last cycle. It is associated with significant cramping. Apart from heavy menstruation, she denies any other formal spontaneous bleeding such as epistaxis, hematuria, or hematochezia. She is attempting to take oral iron supplement but not taking it correctly. It is causing significant constipation. She denies any recent fever, chills, night sweats or abnormal weight loss   have reviewed the past medical history, past surgical history, social history and family history with the patient and they are unchanged from previous note.  ALLERGIES:  has No Known Allergies.  MEDICATIONS:  Current Outpatient Prescriptions  Medication Sig Dispense Refill  . desogestrel-ethinyl estradiol (APRI,EMOQUETTE,SOLIA) 0.15-30 MG-MCG tablet Take 1 tablet by mouth daily.      . ferrous sulfate 325 (65 FE) MG tablet Take 325 mg by mouth daily with breakfast.      . Multiple Vitamin (MULTIVITAMIN) tablet Take 1 tablet by mouth daily. Women's Active       No current facility-administered medications for this visit.     REVIEW OF SYSTEMS:    Constitutional: Denies fevers, chills or night sweats Skin: Denies abnormal skin rashes Lymphatics: Denies new lymphadenopathy or easy bruising Neurological:Denies numbness, tingling or new weaknesses Behavioral/Psych: Mood is stable, no new changes  All other systems were reviewed with the patient and are negative.  PHYSICAL EXAMINATION: ECOG PERFORMANCE STATUS: 0 - Asymptomatic  Filed Vitals:   10/24/12 0841  BP: 126/68  Pulse: 96  Temp: 98.8 F (37.1 C)  Resp: 20   Filed Weights   10/24/12 0841  Weight: 108 lb 6.4 oz (49.17 kg)    GENERAL:alert, no distress and comfortable SKIN: skin color, texture, turgor are normal, no rashes or significant lesions EYES: normal, Conjunctiva are pink and non-injected, sclera clear OROPHARYNX:no exudate, no erythema and lips, buccal mucosa, and tongue normal  NECK: supple, thyroid normal size, non-tender, without nodularity LYMPH:  no palpable lymphadenopathy in the cervical, axillary or inguinal LUNGS: clear to auscultation and percussion with normal breathing effort HEART: regular rate & rhythm and no murmurs and no lower extremity edema ABDOMEN:abdomen soft, non-tender and normal bowel sounds Musculoskeletal:no cyanosis of digits and no clubbing  NEURO: alert & oriented x 3 with fluent speech, no focal motor/sensory deficits  LABORATORY DATA:  I have reviewed the data as listed Results for orders placed in visit on 10/24/12 (from the past 48 hour(s))  CBC WITH DIFFERENTIAL     Status: Abnormal   Collection Time    10/24/12  8:22 AM      Result Value Range   WBC 6.2  3.9 - 10.3 10e3/uL   NEUT# 4.7  1.5 - 6.5 10e3/uL   HGB 11.7  11.6 - 15.9 g/dL   HCT 16.1 (*) 09.6 - 04.5 %   Platelets 68 (*) 145 - 400 10e3/uL   MCV 93.0  79.5 - 101.0 fL   MCH 31.4  25.1 - 34.0 pg   MCHC 33.8  31.5 - 36.0 g/dL   RBC 4.09  8.11 - 9.14 10e6/uL   RDW 13.9  11.2 - 14.5 %   lymph# 1.1  0.9 - 3.3 10e3/uL   MONO# 0.2  0.1 - 0.9 10e3/uL    Eosinophils Absolute 0.1  0.0 - 0.5 10e3/uL   Basophils Absolute 0.0  0.0 - 0.1 10e3/uL   NEUT% 76.6  38.4 - 76.8 %   LYMPH% 18.0  14.0 - 49.7 %   MONO% 3.9  0.0 - 14.0 %   EOS% 1.0  0.0 - 7.0 %   BASO% 0.5  0.0 - 2.0 %      RADIOGRAPHIC STUDIES: I reviewed her most recent CT scan of the chest abdomen and pelvis and agree with the interpretation. 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 She has received one dose of intravenous iron recently but that cough significant rash. It to improve her energy level. She is attempting to take oral iron supplements again. Ferritin level is still pending. I get the patient specific instructions on how to take her oral iron supplement correctly. Unless her ferritin level dropped as to less than 50, I would poor off giving her more iron infusion, rather would prefer she tried to take oral iron supplement herself. The patient denies recent history of bleeding such as epistaxis, hematuria or hematochezia. She is asymptomatic from the anemia. We will observe for now.  she does not require transfusion now.  #2 menorrhagia This could be related to her low platelet count. The birth control pill since to help but she is having heavy menstruation every month afterwards. I recommend she discuss with a gynecologist for possible prescription of 3 months pack of birth control pill before she gets one menstrual cycle. 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 She had normal B12 level and normal thyroid function tests. Recent CT scan excluded possible diagnosis of lymphoma. Reportedly, she has screening test for HIV in the past which was negative. 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 The platelet count will fluctuate depending on the state of her iron  replacement. When she become iron deficient, platelet count usually go up. When her iron stores replenish, the secondary thrombocytosis process will switch off and that will drop her platelet count. Even though is causing symptomatic menorrhagia, the risk of treating her ITP right now outweighs the benefits. I recommend observation unless the platelet count drop less than 50 or she stop bruising or having more signs and symptoms of bleeding. I spent a lot of time educating the patient and her mother about the pathophysiology of ITP. After a long discussion, do in agreement to hold off on treatment. I would like to see her back in 2 months and monitor her blood count #4. We discussed the importance of preventive care and reviewed the vaccination programs. She does not have any prior allergic reactions to influenza vaccination. She agrees to proceed with influenza vaccination but she will get it through her employer.  All questions were answered. The patient knows to  call the clinic with any problems, questions or concerns. No barriers to learning was detected.  I spent 25 minutes counseling the patient face to face. The total time spent in the appointment was 40 minutes and more than 50% was on counseling.     Jerret Mcbane, MD 10/24/2012 9:24 AM

## 2012-10-26 ENCOUNTER — Telehealth: Payer: Self-pay | Admitting: Hematology and Oncology

## 2012-10-26 NOTE — Telephone Encounter (Signed)
lvm for pt regarding to Dec appt.... °

## 2012-12-21 ENCOUNTER — Other Ambulatory Visit (HOSPITAL_BASED_OUTPATIENT_CLINIC_OR_DEPARTMENT_OTHER): Payer: BC Managed Care – PPO

## 2012-12-21 ENCOUNTER — Encounter: Payer: Self-pay | Admitting: Hematology and Oncology

## 2012-12-21 ENCOUNTER — Other Ambulatory Visit: Payer: Self-pay | Admitting: Hematology and Oncology

## 2012-12-21 ENCOUNTER — Ambulatory Visit (HOSPITAL_BASED_OUTPATIENT_CLINIC_OR_DEPARTMENT_OTHER): Payer: BC Managed Care – PPO | Admitting: Hematology and Oncology

## 2012-12-21 VITALS — BP 112/62 | HR 87 | Temp 99.0°F | Resp 18 | Ht 62.0 in | Wt 105.8 lb

## 2012-12-21 DIAGNOSIS — D649 Anemia, unspecified: Secondary | ICD-10-CM

## 2012-12-21 DIAGNOSIS — N920 Excessive and frequent menstruation with regular cycle: Secondary | ICD-10-CM

## 2012-12-21 DIAGNOSIS — D696 Thrombocytopenia, unspecified: Secondary | ICD-10-CM

## 2012-12-21 DIAGNOSIS — K649 Unspecified hemorrhoids: Secondary | ICD-10-CM

## 2012-12-21 LAB — CBC & DIFF AND RETIC
Eosinophils Absolute: 0.1 10*3/uL (ref 0.0–0.5)
HCT: 38.5 % (ref 34.8–46.6)
Immature Retic Fract: 5.1 % (ref 1.60–10.00)
LYMPH%: 44.4 % (ref 14.0–49.7)
MONO#: 0.2 10*3/uL (ref 0.1–0.9)
NEUT#: 1.8 10*3/uL (ref 1.5–6.5)
NEUT%: 48 % (ref 38.4–76.8)
Platelets: 75 10*3/uL — ABNORMAL LOW (ref 145–400)
Retic %: 1.07 % (ref 0.70–2.10)
WBC: 3.7 10*3/uL — ABNORMAL LOW (ref 3.9–10.3)

## 2012-12-21 LAB — FERRITIN CHCC: Ferritin: 48 ng/ml (ref 9–269)

## 2012-12-21 LAB — MORPHOLOGY: RBC Comments: NORMAL

## 2012-12-21 NOTE — Progress Notes (Signed)
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 She also has night sweats weight loss menorrhagia and underwent extensive evaluation. She was also given 1 dose of intravenous iron on 07/27/2012 but developed allergic reaction in the form of a rash. Since her iron infusion, she has improved in her energy level and her night sweats resolved. She's also eating better and able to gain some weight INTERVAL HISTORY: Melinda [name] 27 y.o. female returns for further followup. She is taking 2 iron supplement a day but that's caused severe constipation. She is developing hemorrhoids. She is still having significant menorrhagia. She is seeing her gynecologist for possible different medications to control menorrhagia. She denies any recent fever, chills, night sweats or abnormal weight loss  I have reviewed the past medical history, past surgical history, social history and family history with the patient and they are unchanged from previous note.  ALLERGIES:  has No Known Allergies.  MEDICATIONS:  Current Outpatient Prescriptions  Medication Sig Dispense Refill  . desogestrel-ethinyl estradiol (APRI,EMOQUETTE,SOLIA) 0.15-30 MG-MCG tablet Take 1 tablet by mouth daily.      . ferrous sulfate 325 (65 FE) MG tablet Take 325 mg by mouth daily with breakfast.      . Multiple Vitamin (MULTIVITAMIN) tablet Take 1 tablet by mouth daily. Women's Active       No current facility-administered medications for this visit.     REVIEW OF SYSTEMS:   Constitutional: Denies fevers, chills or night sweats Behavioral/Psych: Mood is stable, no new changes  All other systems were reviewed with the patient and are negative.  PHYSICAL EXAMINATION: ECOG PERFORMANCE STATUS: 0 - Asymptomatic  Filed Vitals:   12/21/12 1419  BP:  112/62  Pulse: 87  Temp: 99 F (37.2 C)  Resp: 18   Filed Weights   12/21/12 1419  Weight: 105 lb 12.8 oz (47.991 kg)    GENERAL:alert, no distress and comfortable SKIN: skin color, texture, turgor are normal, no rashes or significant lesions EYES: normal, Conjunctiva are pink and non-injected, sclera clear OROPHARYNX:no exudate, no erythema and lips, buccal mucosa, and tongue normal  NECK: supple, thyroid normal size, non-tender, without nodularity LYMPH:  no palpable lymphadenopathy in the cervical, axillary or inguinal LUNGS: clear to auscultation and percussion with normal breathing effort HEART: regular rate & rhythm and no murmurs and no lower extremity edema ABDOMEN:abdomen soft, non-tender and normal bowel sounds Musculoskeletal:no cyanosis of digits and no clubbing  NEURO: alert & oriented x 3 with fluent speech, no focal motor/sensory deficits  LABORATORY DATA:  I have reviewed the data as listed Results for orders placed in visit on 12/21/12 (from the past 48 hour(s))  CBC & DIFF AND RETIC     Status: Abnormal   Collection Time    12/21/12  2:14 PM      Result Value Range   WBC 3.7 (*) 3.9 - 10.3 10e3/uL   NEUT# 1.8  1.5 - 6.5 10e3/uL   HGB 12.9  11.6 - 15.9 g/dL   HCT 14.7  82.9 - 56.2 %   Platelets 75 (*) 145 - 400 10e3/uL   MCV 90.0  79.5 - 101.0 fL   MCH 30.1  25.1 - 34.0 pg   MCHC 33.5  31.5 - 36.0 g/dL   RBC 1.30  8.65 - 7.84 10e6/uL   RDW 12.6  11.2 -  14.5 %   lymph# 1.7  0.9 - 3.3 10e3/uL   MONO# 0.2  0.1 - 0.9 10e3/uL   Eosinophils Absolute 0.1  0.0 - 0.5 10e3/uL   Basophils Absolute 0.0  0.0 - 0.1 10e3/uL   NEUT% 48.0  38.4 - 76.8 %   LYMPH% 44.4  14.0 - 49.7 %   MONO% 5.4  0.0 - 14.0 %   EOS% 1.9  0.0 - 7.0 %   BASO% 0.3  0.0 - 2.0 %   Retic % 1.07  0.70 - 2.10 %   Retic Ct Abs 45.80  33.70 - 90.70 10e3/uL   Immature Retic Fract 5.10  1.60 - 10.00 %  FERRITIN [facility]     Status: None   Collection Time    12/21/12  2:14 PM      Result Value  Range   Ferritin 48  9 - 269 ng/ml  MORPHOLOGY     Status: None   Collection Time    12/21/12  2:14 PM      Result Value Range   RBC Comments Within Normal Limits  Within Normal Limits   White Cell Comments C/W auto diff     PLT EST Decreased  Adequate   Platelet Morphology Large Platelets  Within Normal Limits    Lab Results  Component Value Date   WBC 3.7* 12/21/2012   HGB 12.9 12/21/2012   HCT 38.5 12/21/2012   MCV 90.0 12/21/2012   PLT 75* 12/21/2012    ASSESSMENT & PLAN:  #1 anemia This is likely cause of her anemia is severe menorrhagia. She has received one dose of intravenous iron recently but that cough significant rash. It to improve her energy level. Ferritin level is still falling but her hemoglobin has improved. She is asymptomatic from the anemia. We will observe for now.  she does not require transfusion now. I recommend she continue one oral iron supplement a day #2 menorrhagia This could be related to her low platelet count. The birth control pill since to help but she is having heavy menstruation every month afterwards. I recommend she discuss with a gynecologist for possible prescription of 3 months pack of birth control pill before she gets one menstrual cycle. 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 She had normal B12 level and normal thyroid function tests. Recent CT scan excluded possible diagnosis of lymphoma. Reportedly, she has screening test for HIV in the past which was negative. 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 The platelet count will fluctuate depending on the state of her iron replacement. When she become iron deficient, platelet count usually go up. When her iron stores replenish, the secondary thrombocytosis process will switch off and that will drop her platelet count. Even though is causing symptomatic menorrhagia, the risk of treating her ITP right now outweighs the  benefits. I recommend observation unless the platelet count drop less than 50 or she stop bruising or having more signs and symptoms of bleeding. I spent a lot of time educating the patient and her mother about the pathophysiology of ITP. After a long discussion, do in agreement to hold off on treatment. I would like to see her back in 3 months and monitor her blood count.  Due to high cell turnover, I recommend she take folate supplement #4 hemorrhoids I recommend stool softener. All questions were answered. The patient knows to call the clinic with any problems, questions or concerns.  No barriers to learning was detected.  I spent 15 minutes counseling the patient face to face. The total time spent in the appointment was 20 minutes and more than 50% was on counseling.     [facility], Krystle Polcyn, MD 12/21/2012 4:15 PM

## 2012-12-22 ENCOUNTER — Telehealth: Payer: Self-pay | Admitting: Hematology and Oncology

## 2012-12-22 NOTE — Telephone Encounter (Signed)
worked 12/11 POF SW pt adv of March 2015 appts shh

## 2013-03-10 ENCOUNTER — Encounter: Payer: Self-pay | Admitting: Oncology

## 2013-03-22 ENCOUNTER — Other Ambulatory Visit (HOSPITAL_BASED_OUTPATIENT_CLINIC_OR_DEPARTMENT_OTHER): Payer: BC Managed Care – PPO

## 2013-03-22 ENCOUNTER — Telehealth: Payer: Self-pay | Admitting: *Deleted

## 2013-03-22 ENCOUNTER — Encounter: Payer: Self-pay | Admitting: Hematology and Oncology

## 2013-03-22 ENCOUNTER — Ambulatory Visit (HOSPITAL_BASED_OUTPATIENT_CLINIC_OR_DEPARTMENT_OTHER): Payer: BC Managed Care – PPO | Admitting: Hematology and Oncology

## 2013-03-22 VITALS — BP 110/65 | HR 87 | Temp 98.8°F | Resp 18 | Ht 62.0 in | Wt 106.7 lb

## 2013-03-22 DIAGNOSIS — D696 Thrombocytopenia, unspecified: Secondary | ICD-10-CM

## 2013-03-22 DIAGNOSIS — D509 Iron deficiency anemia, unspecified: Secondary | ICD-10-CM

## 2013-03-22 DIAGNOSIS — D649 Anemia, unspecified: Secondary | ICD-10-CM

## 2013-03-22 DIAGNOSIS — Z862 Personal history of diseases of the blood and blood-forming organs and certain disorders involving the immune mechanism: Secondary | ICD-10-CM

## 2013-03-22 LAB — COMPREHENSIVE METABOLIC PANEL (CC13)
ALT: 65 U/L — ABNORMAL HIGH (ref 0–55)
AST: 33 U/L (ref 5–34)
Albumin: 3.9 g/dL (ref 3.5–5.0)
Alkaline Phosphatase: 45 U/L (ref 40–150)
Anion Gap: 12 mEq/L — ABNORMAL HIGH (ref 3–11)
BILIRUBIN TOTAL: 0.59 mg/dL (ref 0.20–1.20)
BUN: 12.3 mg/dL (ref 7.0–26.0)
CO2: 20 mEq/L — ABNORMAL LOW (ref 22–29)
Calcium: 9.2 mg/dL (ref 8.4–10.4)
Chloride: 108 mEq/L (ref 98–109)
Creatinine: 0.9 mg/dL (ref 0.6–1.1)
Glucose: 90 mg/dl (ref 70–140)
Potassium: 4.3 mEq/L (ref 3.5–5.1)
SODIUM: 140 meq/L (ref 136–145)
TOTAL PROTEIN: 7.2 g/dL (ref 6.4–8.3)

## 2013-03-22 LAB — CBC & DIFF AND RETIC
BASO%: 0.5 % (ref 0.0–2.0)
BASOS ABS: 0 10*3/uL (ref 0.0–0.1)
EOS%: 2.5 % (ref 0.0–7.0)
Eosinophils Absolute: 0.1 10*3/uL (ref 0.0–0.5)
HCT: 38.7 % (ref 34.8–46.6)
HGB: 13.1 g/dL (ref 11.6–15.9)
Immature Retic Fract: 3.3 % (ref 1.60–10.00)
LYMPH#: 1.6 10*3/uL (ref 0.9–3.3)
LYMPH%: 39.7 % (ref 14.0–49.7)
MCH: 30.6 pg (ref 25.1–34.0)
MCHC: 33.9 g/dL (ref 31.5–36.0)
MCV: 90.4 fL (ref 79.5–101.0)
MONO#: 0.4 10*3/uL (ref 0.1–0.9)
MONO%: 8.6 % (ref 0.0–14.0)
NEUT#: 2 10*3/uL (ref 1.5–6.5)
NEUT%: 48.7 % (ref 38.4–76.8)
PLATELETS: 78 10*3/uL — AB (ref 145–400)
RBC: 4.28 10*6/uL (ref 3.70–5.45)
RDW: 12.9 % (ref 11.2–14.5)
Retic %: 1.06 % (ref 0.70–2.10)
Retic Ct Abs: 45.37 10*3/uL (ref 33.70–90.70)
WBC: 4.1 10*3/uL (ref 3.9–10.3)
nRBC: 0 % (ref 0–0)

## 2013-03-22 LAB — FERRITIN CHCC: Ferritin: 67 ng/ml (ref 9–269)

## 2013-03-22 NOTE — Telephone Encounter (Signed)
Message copied by Rolanda JayWINDHAM, PHONACELLE C on Thu Mar 22, 2013  4:22 PM ------      [facility]      Created: Thu Mar 22, 2013  4:06 PM      Regarding: Ferritin result       Pls let her know it's better, continue iron supplement until her current bottle runs out, then she can stop taking it unless she starts to become anemic again (she will get labs checked with PCP) ------

## 2013-03-22 NOTE — Telephone Encounter (Signed)
Informed pt of Dr. Gorsuch's message below. She verbalized understanding.  

## 2013-03-22 NOTE — Progress Notes (Signed)
[facility] OFFICE PROGRESS NOTE  [name],[name], MD DIAGNOSIS:  Chronic thrombocytopenia, history of iron deficiency secondary to menorrhagia.  SUMMARY OF HEMATOLOGIC HISTORY: This patient was seen here for workup of mild pancytopenia. She also has night sweats weight loss menorrhagia and underwent extensive evaluation. She was also given 1 dose of intravenous iron on 07/27/2012 but developed allergic reaction in the form of a rash. Since her iron infusion, she has improved in her energy level and her night sweats resolved. She's also eating better and able to gain some weight. The patient was placed on oral ion supplements and folic acid. In December 2014, she was placed on birth control pills to control menorrhagia. INTERVAL HISTORY: Melinda [name] 28 y.o. female returns for further followup. Her energy level has improved. She denies any further menorrhagia. The patient denies any recent signs or symptoms of bleeding such as spontaneous epistaxis, hematuria or hematochezia.  I have reviewed the past medical history, past surgical history, social history and family history with the patient and they are unchanged from previous note.  ALLERGIES:  has No Known Allergies.  MEDICATIONS:  Current Outpatient Prescriptions  Medication Sig Dispense Refill  . ferrous sulfate 325 (65 FE) MG tablet Take 325 mg by mouth daily with breakfast.      . Multiple Vitamin (MULTIVITAMIN) tablet Take 1 tablet by mouth daily. Women's Active      . Norethin Ace-Eth Estrad-FE (MINASTRIN 24 FE) 1-20 MG-MCG(24) CHEW Chew 1 tablet by mouth daily.       No current facility-administered medications for this visit.     REVIEW OF SYSTEMS:   Constitutional: Denies fevers, chills or night sweats Behavioral/Psych: Mood is stable, no new changes  All other systems were reviewed with the patient and are negative.  PHYSICAL EXAMINATION: ECOG PERFORMANCE STATUS: 0 - Asymptomatic  Filed Vitals:   03/22/13 1421  BP: 110/65  Pulse: 87  Temp: 98.8 F (37.1 C)  Resp: 18   Filed Weights   03/22/13 1421  Weight: 106 lb 11.2 oz (48.399 kg)    GENERAL:alert, no distress and comfortable Musculoskeletal:no cyanosis of digits and no clubbing  NEURO: alert & oriented x 3 with fluent speech, no focal motor/sensory deficits  LABORATORY DATA:  I have reviewed the data as listed Results for orders placed in visit on 03/22/13 (from the past 48 hour(s))  CBC & DIFF AND RETIC     Status: Abnormal   Collection Time    03/22/13  2:10 PM      Result Value Ref Range   WBC 4.1  3.9 - 10.3 10e3/uL   NEUT# 2.0  1.5 - 6.5 10e3/uL   HGB 13.1  11.6 - 15.9 g/dL   HCT 78.238.7  95.634.8 - 21.346.6 %   Platelets 78 (*) 145 - 400 10e3/uL   MCV 90.4  79.5 - 101.0 fL   MCH 30.6  25.1 - 34.0 pg   MCHC 33.9  31.5 - 36.0 g/dL   RBC 0.864.28  5.783.70 - 4.695.45 10e6/uL   RDW 12.9  11.2 - 14.5 %   lymph# 1.6  0.9 - 3.3 10e3/uL   MONO# 0.4  0.1 - 0.9 10e3/uL   Eosinophils Absolute 0.1  0.0 - 0.5 10e3/uL   Basophils Absolute 0.0  0.0 - 0.1 10e3/uL   NEUT% 48.7  38.4 - 76.8 %   LYMPH% 39.7  14.0 - 49.7 %   MONO% 8.6  0.0 - 14.0 %   EOS% 2.5  0.0 -  7.0 %   BASO% 0.5  0.0 - 2.0 %   nRBC 0  0 - 0 %   Retic % 1.06  0.70 - 2.10 %   Retic Ct Abs 45.37  33.70 - 90.70 10e3/uL   Immature Retic Fract 3.30  1.60 - 10.00 %  COMPREHENSIVE METABOLIC PANEL (CC13)     Status: Abnormal   Collection Time    03/22/13  2:10 PM      Result Value Ref Range   Sodium 140  136 - 145 mEq/L   Potassium 4.3  3.5 - 5.1 mEq/L   Chloride 108  98 - 109 mEq/L   CO2 20 (*) 22 - 29 mEq/L   Glucose 90  70 - 140 mg/dl   BUN 69.6  7.0 - 29.5 mg/dL   Creatinine 0.9  0.6 - 1.1 mg/dL   Total Bilirubin 2.84  0.20 - 1.20 mg/dL   Alkaline Phosphatase 45  40 - 150 U/L   AST 33  5 - 34 U/L   ALT 65 (*) 0 - 55 U/L   Total Protein 7.2  6.4 - 8.3 g/dL   Albumin 3.9  3.5 - 5.0 g/dL   Calcium 9.2  8.4 - 13.2 mg/dL   Anion Gap 12 (*) 3 - 11 mEq/L    Lab  Results  Component Value Date   WBC 4.1 03/22/2013   HGB 13.1 03/22/2013   HCT 38.7 03/22/2013   MCV 90.4 03/22/2013   PLT 78* 03/22/2013   ASSESSMENT & PLAN:  #1 history of iron deficiency anemia, resolved #2 menorrhagia, resolved Her iron deficiency anemia has resolved. She is tolerating oral iron supplement well. For menorrhagia, this has resolved with birth control pill. I will call the patient once the iron test results available. Once her ferritin is closer 100, she can discontinue oral iron supplement. 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 She had normal B12 level and normal thyroid function tests. Recent CT scan excluded possible diagnosis of lymphoma. Reportedly, she has screening test for HIV in the past which was negative. 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 The platelet count will fluctuate depending on the state of her iron replacement. When she become iron deficient, platelet count usually go up. When her iron stores replenish, the secondary thrombocytosis process will switch off and that will drop her platelet count. Even though is causing symptomatic menorrhagia, the risk of treating her ITP right now outweighs the benefits. I recommend observation unless the platelet count drop less than 50 or she stop bruising or having more signs and symptoms of bleeding. I spent a lot of time educating the patient and her mother about the pathophysiology of ITP. Due to high cell turnover, I recommend she take folate supplement  I spent 15 minutes counseling the patient face to face. The total time spent in the appointment was 20 minutes and more than 50% was on counseling.     [facility], Caleb Prigmore, MD 03/22/2013 3:17 PM

## 2013-04-25 ENCOUNTER — Ambulatory Visit (INDEPENDENT_AMBULATORY_CARE_PROVIDER_SITE_OTHER): Payer: BC Managed Care – PPO | Admitting: Family Medicine

## 2013-04-25 ENCOUNTER — Encounter (HOSPITAL_COMMUNITY): Payer: Self-pay | Admitting: *Deleted

## 2013-04-25 ENCOUNTER — Inpatient Hospital Stay (HOSPITAL_COMMUNITY)
Admission: AD | Admit: 2013-04-25 | Discharge: 2013-04-26 | Disposition: A | Discharge status: Home / Self Care | Payer: BC Managed Care – PPO | Source: Ambulatory Visit | Attending: Obstetrics & Gynecology | Admitting: Obstetrics & Gynecology

## 2013-04-25 ENCOUNTER — Encounter: Payer: Self-pay | Admitting: Family Medicine

## 2013-04-25 VITALS — BP 102/60 | HR 85 | Temp 99.8°F | Ht 62.0 in | Wt 103.0 lb

## 2013-04-25 DIAGNOSIS — R309 Painful micturition, unspecified: Secondary | ICD-10-CM

## 2013-04-25 DIAGNOSIS — D509 Iron deficiency anemia, unspecified: Secondary | ICD-10-CM | POA: Insufficient documentation

## 2013-04-25 DIAGNOSIS — B9689 Other specified bacterial agents as the cause of diseases classified elsewhere: Secondary | ICD-10-CM | POA: Insufficient documentation

## 2013-04-25 DIAGNOSIS — R109 Unspecified abdominal pain: Secondary | ICD-10-CM | POA: Insufficient documentation

## 2013-04-25 DIAGNOSIS — D693 Immune thrombocytopenic purpura: Secondary | ICD-10-CM | POA: Insufficient documentation

## 2013-04-25 DIAGNOSIS — L293 Anogenital pruritus, unspecified: Secondary | ICD-10-CM | POA: Insufficient documentation

## 2013-04-25 DIAGNOSIS — N920 Excessive and frequent menstruation with regular cycle: Secondary | ICD-10-CM

## 2013-04-25 DIAGNOSIS — R300 Dysuria: Secondary | ICD-10-CM

## 2013-04-25 DIAGNOSIS — N898 Other specified noninflammatory disorders of vagina: Secondary | ICD-10-CM

## 2013-04-25 DIAGNOSIS — N921 Excessive and frequent menstruation with irregular cycle: Secondary | ICD-10-CM

## 2013-04-25 DIAGNOSIS — R634 Abnormal weight loss: Secondary | ICD-10-CM | POA: Insufficient documentation

## 2013-04-25 DIAGNOSIS — A499 Bacterial infection, unspecified: Secondary | ICD-10-CM | POA: Insufficient documentation

## 2013-04-25 DIAGNOSIS — N760 Acute vaginitis: Secondary | ICD-10-CM | POA: Insufficient documentation

## 2013-04-25 DIAGNOSIS — N946 Dysmenorrhea, unspecified: Secondary | ICD-10-CM | POA: Insufficient documentation

## 2013-04-25 DIAGNOSIS — N938 Other specified abnormal uterine and vaginal bleeding: Secondary | ICD-10-CM | POA: Insufficient documentation

## 2013-04-25 DIAGNOSIS — N949 Unspecified condition associated with female genital organs and menstrual cycle: Secondary | ICD-10-CM | POA: Insufficient documentation

## 2013-04-25 LAB — POCT URINALYSIS DIPSTICK
BILIRUBIN UA: NEGATIVE
Glucose, UA: NEGATIVE
Ketones, UA: NEGATIVE
LEUKOCYTES UA: NEGATIVE
NITRITE UA: NEGATIVE
Protein, UA: NEGATIVE
RBC UA: NEGATIVE
Spec Grav, UA: 1.01
Urobilinogen, UA: 0.2
pH, UA: 6

## 2013-04-25 LAB — POCT URINE PREGNANCY: PREG TEST UR: NEGATIVE

## 2013-04-25 MED ORDER — FLUCONAZOLE 150 MG PO TABS: 150.0000 mg | ORAL_TABLET | Freq: Once | ORAL | Status: DC

## 2013-04-25 NOTE — Progress Notes (Signed)
Pre visit review using our clinic review tool, if applicable. No additional management support is needed unless otherwise documented below in the visit note. 

## 2013-04-25 NOTE — MAU Note (Signed)
Pt reports abd pain for one day. Pt also reports that she has vaginal itching with along with a slight odor.

## 2013-04-25 NOTE — MAU Note (Signed)
Pt reports bilateral lower abd pain x 24 hours. Vaginal itching and burning.

## 2013-04-25 NOTE — Progress Notes (Signed)
   Subjective:    Patient ID: Melinda JasperWhitney [name], female    DOB: 03/26/1985, 28 y.o.   MRN: [id number]  HPI Here for 5 days of vaginal spotting, swelling around the labia, and itching. No discharge and no odor. She has mild lower pelvic cramps. No fever. She had a normal pelvic exam and Pap at the office of Dr. Henreitta LeberElmira Powell last fall. She had been on BCP for awhile but she decided to stop taking these 2 weeks ago.    Review of Systems  Constitutional: Negative.   Gastrointestinal: Negative.   Genitourinary: Positive for vaginal bleeding and pelvic pain. Negative for dysuria, urgency, frequency, hematuria, flank pain, vaginal discharge and genital sores.       Objective:   Physical Exam  Constitutional: She appears well-developed and well-nourished.  Abdominal: Soft. Bowel sounds are normal. She exhibits no distension and no mass. There is no tenderness. There is no rebound and no guarding.          Assessment & Plan:  She probably has a yeast infection so we will treat with Diflucan. She is probably spotting due to coming off BCP. If she does not feel better in a few days I suggested she see Dr. Lowell GuitarPowell.

## 2013-04-26 ENCOUNTER — Inpatient Hospital Stay (HOSPITAL_COMMUNITY): Payer: BC Managed Care – PPO

## 2013-04-26 DIAGNOSIS — N946 Dysmenorrhea, unspecified: Secondary | ICD-10-CM

## 2013-04-26 LAB — GC/CHLAMYDIA PROBE AMP
CT Probe RNA: NEGATIVE
GC Probe RNA: NEGATIVE

## 2013-04-26 LAB — CBC
HCT: 41.6 % (ref 36.0–46.0)
HEMOGLOBIN: 14.2 g/dL (ref 12.0–15.0)
MCH: 31.4 pg (ref 26.0–34.0)
MCHC: 34.1 g/dL (ref 30.0–36.0)
MCV: 92 fL (ref 78.0–100.0)
PLATELETS: 59 10*3/uL — AB (ref 150–400)
RBC: 4.52 MIL/uL (ref 3.87–5.11)
RDW: 12.8 % (ref 11.5–15.5)
WBC: 3.8 10*3/uL — ABNORMAL LOW (ref 4.0–10.5)

## 2013-04-26 LAB — WET PREP, GENITAL
TRICH WET PREP: NONE SEEN
Yeast Wet Prep HPF POC: NONE SEEN

## 2013-04-26 MED ORDER — TRAMADOL HCL 50 MG PO TABS: 50.0000 mg | ORAL_TABLET | Freq: Four times a day (QID) | ORAL | Status: DC | PRN

## 2013-04-26 MED ORDER — KETOROLAC TROMETHAMINE 60 MG/2ML IM SOLN
60.0000 mg | Freq: Once | INTRAMUSCULAR | Status: DC
  Filled 2013-04-26: qty 2

## 2013-04-26 MED ORDER — METRONIDAZOLE 500 MG PO TABS: 500.0000 mg | ORAL_TABLET | Freq: Two times a day (BID) | ORAL | Status: DC

## 2013-04-26 NOTE — MAU Provider Note (Signed)
Chief Complaint: Abdominal Pain and Vaginal Itching   First Provider Initiated Contact with Patient 04/26/13 0134     SUBJECTIVE HPI: Melinda [name] is a 28 y.o. G0P0 nonpregnant female who presents with bilateral groin pain x24 hours and vulvar itching. Concerned it may be related to her ovaries. Seen by primary care provider yesterday for same. UPT and UA negative. Prescribed Diflucan (taken), but pelvic exam not done. Instructed to followup with gynecologist if symptoms continued so she came to maternity admissions. Rates pain 5/10 on pain scale. Describes it as constant, and dull. Has not taken anything for it. Patient is on birth control pills, but has taken them very sporadically and missed several. Has started having light vaginal bleeding since arriving in maternity admissions. No recent sexual intercourse.  Followed by hematology for pancytopenia/ ITP. Was put on OCPs for menorrhagia. Anemia resolved.  Past Medical History  Diagnosis Date  . Chickenpox   . Iron deficiency anemia   . Night sweat   . Weight loss, non-intentional   . Early satiety   . Thrombocytopenia   . Anemia    OB History  Gravida Para Term Preterm AB SAB TAB Ectopic Multiple Living  0                History reviewed. No pertinent past surgical history. History   Social History  . Marital Status: Single    Spouse Name: N/A    Number of Children: 0  . Years of Education: N/A   Occupational History  .      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 Smoking status: Never Smoker   . Smokeless tobacco: Never Used  . Alcohol Use: No  . Drug Use: No  . Sexual Activity: Not Currently    Birth Control/ Protection: Pill     Comment: DESOGEN   (GEN)   Other Topics Concern  . Not on file   Social History Narrative  . No narrative on file   No current facility-administered medications on file prior to encounter.   Current Outpatient Prescriptions on File Prior to Encounter   Medication Sig Dispense Refill  . ferrous sulfate 325 (65 FE) MG tablet Take 325 mg by mouth daily with breakfast. Over the counter      . fluconazole (DIFLUCAN) 150 MG tablet Take 1 tablet (150 mg total) by mouth once.  1 tablet  5  . Norethin Ace-Eth Estrad-FE (MINASTRIN 24 FE) 1-20 MG-MCG(24) CHEW Chew 1 tablet by mouth daily.      . Multiple Vitamin (MULTIVITAMIN) tablet Take 1 tablet by mouth daily. Women's Active       No Known Allergies  ROS: Pertinent positive items in HPI. Negative for fever, chills, dyspareunia, bleeding with intercourse, urinary complaints, GI complaints, vaginal discharge or lesions.  OBJECTIVE Blood pressure 116/59, pulse 92, temperature 99.1 F (37.3 C), temperature source Oral, resp. rate 16, height 5\' 2"  (1.575 m), weight 47.174 kg (104 lb), last menstrual period 04/05/2013, SpO2 100.00%. GENERAL: Well-developed, well-nourished female in no acute distress.  HEENT: Normocephalic HEART: normal rate RESP: normal effort ABDOMEN: Soft, non-tender. Normal bowel sounds x4. Mild, bilateral groin tenderness. Nodes normal size and texture, mobile.  EXTREMITIES: Nontender, no edema NEURO: Alert and oriented SPECULUM EXAM: NEFG except for 3X4 centimeter firm, nontender, raised mass on mons pubis. No erythema, bleeding or discharge. Patient states she has seen a dermatologist for this and was diagnosed with keloid. Also noted is a 3 cm long  by 1 cm deep nontender, healing fissure in the posterior fourchette. Patient has no explanation for this finding. Moderate amount of creamy, pink, malodorous discharge noted, cervix clean BIMANUAL: cervix closed; uterus normal size, no adnexal tenderness or masses, no cervical motion tenderness.  LAB RESULTS Results for orders placed during the hospital encounter of 04/25/13 (from the past 168 hour(s))  CBC   Collection Time    04/26/13 12:55 AM      Result Value Ref Range   WBC 3.8 (*) 4.0 - 10.5 K/uL   RBC 4.52  3.87 - 5.11  MIL/uL   Hemoglobin 14.2  12.0 - 15.0 g/dL   HCT 16.1  09.6 - 04.5 %   MCV 92.0  78.0 - 100.0 fL   MCH 31.4  26.0 - 34.0 pg   MCHC 34.1  30.0 - 36.0 g/dL   RDW 40.9  81.1 - 91.4 %   Platelets 59 (*) 150 - 400 K/uL  WET PREP, GENITAL   Collection Time    04/26/13  1:50 AM      Result Value Ref Range   Yeast Wet Prep HPF POC NONE SEEN  NONE SEEN   Trich, Wet Prep NONE SEEN  NONE SEEN   Clue Cells Wet Prep HPF POC FEW (*) NONE SEEN   WBC, Wet Prep HPF POC FEW (*) NONE SEEN  Results for orders placed in visit on 04/25/13 (from the past 168 hour(s))  POCT URINALYSIS DIPSTICK   Collection Time    04/25/13  3:41 PM      Result Value Ref Range   Color, UA yellow     Clarity, UA clear     Glucose, UA n     Bilirubin, UA n     Ketones, UA n     Spec Grav, UA 1.010     Blood, UA n     pH, UA 6.0     Protein, UA n     Urobilinogen, UA 0.2     Nitrite, UA n     Leukocytes, UA Negative    POCT URINE PREGNANCY   Collection Time    04/25/13  3:41 PM      Result Value Ref Range   Preg Test, Ur Negative      IMAGING US Transvaginal Non-ob  04/26/2013   CLINICAL DATA:  Pelvic pain  EXAM: TRANSABDOMINAL AND TRANSVAGINAL ULTRASOUND OF PELVIS  TECHNIQUE: Both transabdominal and transvaginal ultrasound examinations of the pelvis were performed. Transabdominal technique was performed for global imaging of the pelvis including uterus, ovaries, adnexal regions, and pelvic cul-de-sac. It was necessary to proceed with endovaginal exam following the transabdominal exam to visualize the ovaries and endometrium.  COMPARISON:  None  FINDINGS: Uterus  Measurements: 6.6 x 4.1 x 3.5 cm. The uterus is retroflexed. No fibroids or other mass visualized.  Endometrium  Thickness: 5.1 mm.  No focal abnormality visualized.  Right ovary  Measurements: 3 x 3.1 x 1.7 cm. Normal appearance/no adnexal mass.  Left ovary  Measurements: 2.8 x 1.8 x 1.9 cm. Normal appearance/no adnexal mass.  Other findings  There is a trace  amount of pelvic free fluid likely physiologic.  IMPRESSION: Normal pelvic ultrasound.   Electronically Signed   By: Elige Ko   On: 04/26/2013 01:27   US Pelvis Complete  04/26/2013   CLINICAL DATA:  Pelvic pain  EXAM: TRANSABDOMINAL AND TRANSVAGINAL ULTRASOUND OF PELVIS  TECHNIQUE: Both transabdominal and transvaginal ultrasound examinations of the pelvis were performed. Transabdominal technique  was performed for global imaging of the pelvis including uterus, ovaries, adnexal regions, and pelvic cul-de-sac. It was necessary to proceed with endovaginal exam following the transabdominal exam to visualize the ovaries and endometrium.  COMPARISON:  None  FINDINGS: Uterus  Measurements: 6.6 x 4.1 x 3.5 cm. The uterus is retroflexed. No fibroids or other mass visualized.  Endometrium  Thickness: 5.1 mm.  No focal abnormality visualized.  Right ovary  Measurements: 3 x 3.1 x 1.7 cm. Normal appearance/no adnexal mass.  Left ovary  Measurements: 2.8 x 1.8 x 1.9 cm. Normal appearance/no adnexal mass.  Other findings  There is a trace amount of pelvic free fluid likely physiologic.  IMPRESSION: Normal pelvic ultrasound.   Electronically Signed   By: Elige KoHetal  Patel   On: 04/26/2013 01:27    MAU COURSE  ASSESSMENT 1. Dysmenorrhea   2. BV (bacterial vaginosis)   3. Breakthrough bleeding on OCPs   4. Chronic ITP (idiopathic thrombocytopenia)    PLAN Discharge home in stable condition. Pt informed CNM at end of visit that she goes to [facility] for routine Gyn care.  Suspect patient is starting her period. Recommend that she start new pack of birth control pills this Sunday, 04/29/2013 and then take consistently. GC/CT cultures pending. No intercourse for one week or until the fissure has healed. May use A&D ointment if she experiences burning. Follow-up Information   Follow up with POWELL,ELMIRA, PA-C. (As needed if symptoms do not resolve.)    Specialty:  Obstetrics and Gynecology   Contact information:    [address] [place] [address] [phone]       [facility]. (As scheduled for thrombocytopenia)    Specialty:  Hematology and Oncology   Contact information:   [address] [place] [phone]       [facility] with [facility]. (As needed in emergencies)    Contact information:   [address] [id number] [place] [address] [phone]        Medication List         ferrous sulfate 325 (65 FE) MG tablet  Take 325 mg by mouth daily with breakfast. Over the counter     fluconazole 150 MG tablet  Commonly known as:  DIFLUCAN  Take 1 tablet (150 mg total) by mouth once.     metroNIDAZOLE 500 MG tablet  Commonly known as:  FLAGYL  Take 1 tablet (500 mg total) by mouth 2 (two) times daily.     MINASTRIN 24 FE 1-20 MG-MCG(24) Chew  Generic drug:  Norethin Ace-Eth Estrad-FE  Chew 1 tablet by mouth daily.     multivitamin tablet  Take 1 tablet by mouth daily. Women's Active     traMADol 50 MG tablet  Commonly known as:  ULTRAM  Take 1-2 tablets (50-100 mg total) by mouth every 6 (six) hours as needed for severe pain.       LawteyVirginia Rhylynn Perdomo, CNM 04/26/2013  2:20 AM

## 2013-04-26 NOTE — Discharge Instructions (Signed)
Start a new pack of birth control pills on Sunday, 04/29/2013.  Bacterial Vaginosis Bacterial vaginosis is a vaginal infection that occurs when the normal balance of bacteria in the vagina is disrupted. It results from an overgrowth of certain bacteria. This is the most common vaginal infection in women of childbearing age. Treatment is important to prevent complications, especially in pregnant women, as it can cause a premature delivery. CAUSES  Bacterial vaginosis is caused by an increase in harmful bacteria that are normally present in smaller amounts in the vagina. Several different kinds of bacteria can cause bacterial vaginosis. However, the reason that the condition develops is not fully understood. RISK FACTORS Certain activities or behaviors can put you at an increased risk of developing bacterial vaginosis, including:  Having a new sex partner or multiple sex partners.  Douching.  Using an intrauterine device (IUD) for contraception. Women do not get bacterial vaginosis from toilet seats, bedding, swimming pools, or contact with objects around them. SIGNS AND SYMPTOMS  Some women with bacterial vaginosis have no signs or symptoms. Common symptoms include:  Grey vaginal discharge.  A fishlike odor with discharge, especially after sexual intercourse.  Itching or burning of the vagina and vulva.  Burning or pain with urination. DIAGNOSIS  Your health care provider will take a medical history and examine the vagina for signs of bacterial vaginosis. A sample of vaginal fluid may be taken. Your health care provider will look at this sample under a microscope to check for bacteria and abnormal cells. A vaginal pH test may also be done.  TREATMENT  Bacterial vaginosis may be treated with antibiotic medicines. These may be given in the form of a pill or a vaginal cream. A second round of antibiotics may be prescribed if the condition comes back after treatment.  HOME CARE INSTRUCTIONS     Only take over-the-counter or prescription medicines as directed by your health care provider.  If antibiotic medicine was prescribed, take it as directed. Make sure you finish it even if you start to feel better.  Do not have sex until treatment is completed.  Tell all sexual partners that you have a vaginal infection. They should see their health care provider and be treated if they have problems, such as a mild rash or itching.  Practice safe sex by using condoms and only having one sex partner. SEEK MEDICAL CARE IF:   Your symptoms are not improving after 3 days of treatment.  You have increased discharge or pain.  You have a fever. MAKE SURE YOU:   Understand these instructions.  Will watch your condition.  Will get help right away if you are not doing well or get worse. FOR MORE INFORMATION  Centers for Disease Control and Prevention, Division of STD Prevention: SolutionApps.co.zawww.cdc.gov/std American Sexual Health Association (ASHA): www.ashastd.org  Document Released: 12/28/2004 Document Revised: 10/18/2012 Document Reviewed: 08/09/2012 [facility] Patient Information 2014 [place].  Oral Contraception Use Oral contraceptive pills (OCPs) are medicines taken to prevent pregnancy. OCPs work by preventing the ovaries from releasing eggs. The hormones in OCPs also cause the cervical mucus to thicken, preventing the sperm from entering the uterus. The hormones also cause the uterine lining to become thin, not allowing a fertilized egg to attach to the inside of the uterus. OCPs are highly effective when taken exactly as prescribed. However, OCPs do not prevent sexually transmitted diseases (STDs). Safe sex practices, such as using condoms along with an OCP, can help prevent STDs. Before taking OCPs, you  may have a physical exam and Pap test. Your health care provider may also order blood tests if necessary. Your health care provider will make sure you are a good candidate for oral  contraception. Discuss with your health care provider the possible side effects of the OCP you may be prescribed. When starting an OCP, it can take 2 to 3 months for the body to adjust to the changes in hormone levels in your body.  HOW TO TAKE ORAL CONTRACEPTIVE PILLS Your health care provider may advise you on how to start taking the first cycle of OCPs. Otherwise, you can:   Start on day 1 of your menstrual period. You will not need any backup contraceptive protection with this start time.   Start on the first Sunday after your menstrual period or the day you get your prescription. In these cases, you will need to use backup contraceptive protection for the first week.   Start the pill at any time of your cycle. If you take the pill within 5 days of the start of your period, you are protected against pregnancy right away. In this case, you will not need a backup form of birth control. If you start at any other time of your menstrual cycle, you will need to use another form of birth control for 7 days. If your OCP is the type called a minipill, it will protect you from pregnancy after taking it for 2 days (48 hours). After you have started taking OCPs:   If you forget to take 1 pill, take it as soon as you remember. Take the next pill at the regular time.   If you miss 2 or more pills, call your health care provider because different pills have different instructions for missed doses. Use backup birth control until your next menstrual period starts.   If you use a 28-day pack that contains inactive pills and you miss 1 of the last 7 pills (pills with no hormones), it will not matter. Throw away the rest of the nonhormone pills and start a new pill pack.  No matter which day you start the OCP, you will always start a new pack on that same day of the week. Have an extra pack of OCPs and a backup contraceptive method available in case you miss some pills or lose your OCP pack.  HOME CARE  INSTRUCTIONS   Do not smoke.   Always use a condom to protect against STDs. OCPs do not protect against STDs.   Use a calendar to mark your menstrual period days.   Read the information and directions that came with your OCP. Talk to your health care provider if you have questions.  SEEK MEDICAL CARE IF:   You develop nausea and vomiting.   You have abnormal vaginal discharge or bleeding.   You develop a rash.   You miss your menstrual period.   You are losing your hair.   You need treatment for mood swings or depression.   You get dizzy when taking the OCP.   You develop acne from taking the OCP.   You become pregnant.  SEEK IMMEDIATE MEDICAL CARE IF:   You develop chest pain.   You develop shortness of breath.   You have an uncontrolled or severe headache.   You develop numbness or slurred speech.   You develop visual problems.   You develop pain, redness, and swelling in the legs.  Document Released: 12/17/2010 Document Revised: 08/30/2012 Document Reviewed: 06/18/2012 ExitCare  Patient Information 2014 [place].

## 2013-05-07 ENCOUNTER — Telehealth: Payer: Self-pay | Admitting: *Deleted

## 2013-05-07 NOTE — Telephone Encounter (Signed)
Pt suffering from "allergy symptoms"  Sneezing and itchy eyes.  Asks if it is safe to take allergy medication w/ her low platelets.  Instructed ok to take OTC seasonal allergy medication such as Claritin or zyrtec.  Instructed to avoid any aspirin or ibuprofen containing medication.  Avoid any NSAIDs which may increase risk of bleeding.  Pt verbalized understanding.

## 2013-08-15 ENCOUNTER — Encounter: Payer: Self-pay | Admitting: Family Medicine

## 2013-08-15 ENCOUNTER — Ambulatory Visit (INDEPENDENT_AMBULATORY_CARE_PROVIDER_SITE_OTHER): Payer: BC Managed Care – PPO | Admitting: Family Medicine

## 2013-08-15 VITALS — BP 110/72 | HR 95 | Temp 99.9°F | Ht 62.0 in | Wt 104.0 lb

## 2013-08-15 DIAGNOSIS — N9089 Other specified noninflammatory disorders of vulva and perineum: Secondary | ICD-10-CM

## 2013-08-15 MED ORDER — VALACYCLOVIR HCL 500 MG PO TABS: 500.0000 mg | ORAL_TABLET | Freq: Two times a day (BID) | ORAL | Status: DC

## 2013-08-15 NOTE — Progress Notes (Signed)
   Subjective:    Patient ID: Melinda [name], female    DOB: 07/21/1985, 28 y.o.   MRN: [id number]  HPI Here for 4 days of an irritated area in the perineum near the urethra that is slightly sore. This causes it to burn when she urinates over the skin. No urinary frequency. She has not seen any rashes or blisters.    Review of Systems  Constitutional: Negative.   Genitourinary: Positive for dysuria. Negative for urgency, frequency, hematuria, flank pain, difficulty urinating, genital sores and pelvic pain.       Objective:   Physical Exam  Constitutional: She appears well-developed and well-nourished. No distress.  Cardiovascular: Normal rate, regular rhythm, normal heart sounds and intact distal pulses.   Pulmonary/Chest: Effort normal and breath sounds normal.  Genitourinary:  There is a single ulcerated lesion on the right labia majora which is dry and quite tender. 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 We will give her some Valtrex to take for 7 days. A bacterial culture was obtained to look for chancroid. We will test for syphillis and herpes simplex as well.

## 2013-08-15 NOTE — Progress Notes (Signed)
Pre visit review using our clinic review tool, if applicable. No additional management support is needed unless otherwise documented below in the visit note. 

## 2013-08-16 LAB — HIV ANTIBODY (ROUTINE TESTING W REFLEX): HIV 1&2 Ab, 4th Generation: NONREACTIVE

## 2013-08-16 LAB — RPR

## 2013-08-16 LAB — HSV 2 ANTIBODY, IGG: HSV 2 Glycoprotein G Ab, IgG: 4.11 IV — ABNORMAL HIGH

## 2013-08-18 LAB — WOUND CULTURE: GRAM STAIN: NONE SEEN

## 2013-08-22 ENCOUNTER — Telehealth: Payer: Self-pay | Admitting: Family Medicine

## 2013-08-22 NOTE — Telephone Encounter (Signed)
I spoke with pt and the script was sent in on 08/15/13.

## 2013-08-22 NOTE — Telephone Encounter (Addendum)
Pt states someone called her and she thought to refill med along w/ the 2 wk fup request. But no med at pharm and I do not see a refill for pt. pls advise

## 2013-08-24 ENCOUNTER — Ambulatory Visit: Payer: Self-pay | Admitting: Physician Assistant

## 2013-08-24 ENCOUNTER — Telehealth: Payer: Self-pay | Admitting: Family Medicine

## 2013-08-24 NOTE — Telephone Encounter (Signed)
Patient Information:  Caller Name: Ahana  Phone: [phone]  Patient: Melinda [name], Melinda [name]  Gender: Female  DOB: 27-Sep-1985  Age: 28 Years  PCP: Gershon Crane [facility])  Pregnant: No  Office Follow Up:  Does the office need to follow up with this patient?: No  Instructions For The Office: N/A   Symptoms  Reason For Call & Symptoms: Pt reports she has broken blood vessel in right eye.  Reviewed Health History In EMR: Yes  Reviewed Medications In EMR: Yes  Reviewed Allergies In EMR: Yes  Reviewed Surgeries / Procedures: Yes  Date of Onset of Symptoms: 08/24/2013 OB / GYN:  LMP: 08/24/2013  Guideline(s) Used:  Eye - Red Without Pus  Disposition Per Guideline:   See Today in Office  Reason For Disposition Reached:   Bleeding on white of the eye and is taking Coumadin or known bleeding disorder (e.g., thrombocytopenia)  Advice Given:  Call Back If:  You become worse.  Patient Will Follow Care Advice:  YES  Appointment Scheduled:  08/24/2013 15:15:00 Appointment Scheduled Provider:  Donell Beers (only sees ages 15 and up)

## 2013-08-24 NOTE — Telephone Encounter (Signed)
Noted  

## 2013-08-24 NOTE — Telephone Encounter (Signed)
Pt coming [facility] Don[facility] BeersMatthew Tucker, [facility]

## 2013-09-05 ENCOUNTER — Ambulatory Visit: Payer: BC Managed Care – PPO | Admitting: Family Medicine

## 2013-09-11 ENCOUNTER — Ambulatory Visit (INDEPENDENT_AMBULATORY_CARE_PROVIDER_SITE_OTHER): Payer: BC Managed Care – PPO | Admitting: Family Medicine

## 2013-09-11 ENCOUNTER — Encounter: Payer: Self-pay | Admitting: Family Medicine

## 2013-09-11 VITALS — BP 138/86 | HR 106 | Temp 99.2°F | Ht 62.0 in | Wt 107.0 lb

## 2013-09-11 DIAGNOSIS — B009 Herpesviral infection, unspecified: Secondary | ICD-10-CM | POA: Insufficient documentation

## 2013-09-11 MED ORDER — VALACYCLOVIR HCL 500 MG PO TABS: 500.0000 mg | ORAL_TABLET | Freq: Every day | ORAL | Status: DC

## 2013-09-11 NOTE — Progress Notes (Signed)
   Subjective:    Patient ID: Melinda [name], female    DOB: [birth year], 28 y.o.   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 She was here recently for a labial lesion and testing was positive for herpes. She was given Valtrex to take bid for a week. The lesion has completely healed now. She has numerous questions of course about this and we had a long discussion about the virus and its treatment. She had a normal Pap smear at her GYN office last winter.    Review of Systems  Constitutional: Negative.   Genitourinary: Negative for vaginal bleeding, vaginal discharge and vaginal pain.       Objective:   Physical Exam  Constitutional: She appears well-developed and well-nourished.          Assessment & Plan:  We discussed the importance of taking a daily dose of Valtrex to suppress the virus and she understands. We also discussed the importance of talking to future sexual partners about this and of using condoms. Recheck prn

## 2013-09-11 NOTE — Progress Notes (Signed)
Pre visit review using our clinic review tool, if applicable. No additional management support is needed unless otherwise documented below in the visit note. 

## 2014-02-08 ENCOUNTER — Encounter: Payer: Self-pay | Admitting: Family Medicine

## 2014-02-08 ENCOUNTER — Ambulatory Visit (INDEPENDENT_AMBULATORY_CARE_PROVIDER_SITE_OTHER): Payer: BLUE CROSS/BLUE SHIELD | Admitting: Family Medicine

## 2014-02-08 VITALS — BP 121/82 | HR 92 | Temp 99.3°F | Ht 62.0 in | Wt 108.0 lb

## 2014-02-08 DIAGNOSIS — M654 Radial styloid tenosynovitis [de Quervain]: Secondary | ICD-10-CM

## 2014-02-08 MED ORDER — DICLOFENAC SODIUM 75 MG PO TBEC: 75.0000 mg | DELAYED_RELEASE_TABLET | Freq: Two times a day (BID) | ORAL | Status: DC

## 2014-02-08 NOTE — Progress Notes (Signed)
Pre visit review using our clinic review tool, if applicable. No additional management support is needed unless otherwise documented below in the visit note. 

## 2014-02-11 NOTE — Progress Notes (Signed)
   Subjective:    Patient ID: Melinda [name], female    DOB: 12/11/1985, 29 y.o.   MRN: [id number]  HPI Here for 2 weeks of pain in the right wrist. No hx of trauma. Using a short Ace wrap with no benefit. She spends a lot of time typing on her lap top for school.    Review of Systems  Constitutional: Negative.   Musculoskeletal: Positive for arthralgias. Negative for joint swelling.       Objective:   Physical Exam  Constitutional: She appears well-developed and well-nourished.  Musculoskeletal:  She is tender along the extensor tendon for the right thumb. The remainder of the wrist is normal. No swelling          Assessment & Plan:  She will rest the hand as much as possible. Reduce inflammation with Diclofenac. Wear a wrist brace. Apply ice packs. Recheck prn

## 2015-04-01 ENCOUNTER — Ambulatory Visit (INDEPENDENT_AMBULATORY_CARE_PROVIDER_SITE_OTHER): Payer: BLUE CROSS/BLUE SHIELD | Admitting: Family Medicine

## 2015-04-01 ENCOUNTER — Encounter: Payer: Self-pay | Admitting: Family Medicine

## 2015-04-01 VITALS — BP 128/88 | HR 101 | Temp 98.9°F | Wt 115.0 lb

## 2015-04-01 DIAGNOSIS — R1030 Lower abdominal pain, unspecified: Secondary | ICD-10-CM | POA: Diagnosis not present

## 2015-04-01 LAB — POCT URINALYSIS DIPSTICK
Bilirubin, UA: NEGATIVE
GLUCOSE UA: NEGATIVE
Ketones, UA: NEGATIVE
LEUKOCYTES UA: NEGATIVE
NITRITE UA: NEGATIVE
Protein, UA: NEGATIVE
RBC UA: NEGATIVE
Spec Grav, UA: <=1.005
UROBILINOGEN UA: 0.2
pH, UA: 6

## 2015-04-01 MED ORDER — METRONIDAZOLE 500 MG PO TABS: 500.0000 mg | ORAL_TABLET | Freq: Three times a day (TID) | ORAL | Status: DC

## 2015-04-01 NOTE — Progress Notes (Signed)
   Subjective:    Patient ID: Particia JasperWhitney Lang, female    DOB: 09/28/1985, 30 y.o.   MRN: [id number]  HPI Here for 2 weeks of intermittent mild lower abdominal cramps, bloating, passing gas, nausea without vomiting, and increased frequency of urinations. No burning. Her menses are due to start in a few days. No fever.   Review of Systems  Constitutional: Negative.   Respiratory: Negative.   Cardiovascular: Negative.   Gastrointestinal: Positive for abdominal pain and diarrhea. Negative for nausea, vomiting, constipation, blood in stool, abdominal distention, anal bleeding and rectal pain.  Genitourinary: Positive for urgency and frequency. Negative for dysuria, hematuria, flank pain, menstrual problem and pelvic pain.       Objective:   Physical Exam  Constitutional: She appears well-developed and well-nourished.  Cardiovascular: Normal rate, regular rhythm, normal heart sounds and intact distal pulses.   Pulmonary/Chest: Effort normal and breath sounds normal.  Abdominal: Soft. Bowel sounds are normal. She exhibits no distension.          Assessment & Plan:  Possible bacterial vaginitis, treat with Flagyl. Recheck prn

## 2015-04-01 NOTE — Progress Notes (Signed)
Pre visit review using our clinic review tool, if applicable. No additional management support is needed unless otherwise documented below in the visit note. 

## 2015-04-03 ENCOUNTER — Encounter (HOSPITAL_COMMUNITY): Payer: Self-pay

## 2015-04-03 ENCOUNTER — Emergency Department (HOSPITAL_COMMUNITY)
Admission: EM | Admit: 2015-04-03 | Discharge: 2015-04-04 | Disposition: A | Discharge status: Home / Self Care | Payer: BLUE CROSS/BLUE SHIELD | Attending: Emergency Medicine | Admitting: Emergency Medicine

## 2015-04-03 DIAGNOSIS — Z3202 Encounter for pregnancy test, result negative: Secondary | ICD-10-CM | POA: Insufficient documentation

## 2015-04-03 DIAGNOSIS — Z862 Personal history of diseases of the blood and blood-forming organs and certain disorders involving the immune mechanism: Secondary | ICD-10-CM | POA: Insufficient documentation

## 2015-04-03 DIAGNOSIS — R1030 Lower abdominal pain, unspecified: Secondary | ICD-10-CM | POA: Diagnosis present

## 2015-04-03 DIAGNOSIS — R110 Nausea: Secondary | ICD-10-CM | POA: Insufficient documentation

## 2015-04-03 DIAGNOSIS — Z8619 Personal history of other infectious and parasitic diseases: Secondary | ICD-10-CM | POA: Diagnosis not present

## 2015-04-03 DIAGNOSIS — Z793 Long term (current) use of hormonal contraceptives: Secondary | ICD-10-CM | POA: Diagnosis not present

## 2015-04-03 DIAGNOSIS — R140 Abdominal distension (gaseous): Secondary | ICD-10-CM | POA: Insufficient documentation

## 2015-04-03 DIAGNOSIS — M549 Dorsalgia, unspecified: Secondary | ICD-10-CM | POA: Insufficient documentation

## 2015-04-03 LAB — COMPREHENSIVE METABOLIC PANEL
ALBUMIN: 3.9 g/dL (ref 3.5–5.0)
ALT: 61 U/L — AB (ref 14–54)
ANION GAP: 8 (ref 5–15)
AST: 28 U/L (ref 15–41)
Alkaline Phosphatase: 37 U/L — ABNORMAL LOW (ref 38–126)
BUN: 9 mg/dL (ref 6–20)
CO2: 22 mmol/L (ref 22–32)
CREATININE: 0.85 mg/dL (ref 0.44–1.00)
Calcium: 9.1 mg/dL (ref 8.9–10.3)
Chloride: 109 mmol/L (ref 101–111)
GFR calc Af Amer: >60 mL/min (ref >60)
GFR calc non Af Amer: >60 mL/min (ref >60)
GLUCOSE: 94 mg/dL (ref 65–99)
Potassium: 3.9 mmol/L (ref 3.5–5.1)
SODIUM: 139 mmol/L (ref 135–145)
Total Bilirubin: 0.8 mg/dL (ref 0.3–1.2)
Total Protein: 7.1 g/dL (ref 6.5–8.1)

## 2015-04-03 LAB — CBC
HCT: 38.3 % (ref 36.0–46.0)
Hemoglobin: 12.8 g/dL (ref 12.0–15.0)
MCH: 30.3 pg (ref 26.0–34.0)
MCHC: 33.4 g/dL (ref 30.0–36.0)
MCV: 90.8 fL (ref 78.0–100.0)
Platelets: 65 10*3/uL — ABNORMAL LOW (ref 150–400)
RBC: 4.22 MIL/uL (ref 3.87–5.11)
RDW: 14.5 % (ref 11.5–15.5)
WBC: 5 10*3/uL (ref 4.0–10.5)

## 2015-04-03 LAB — LIPASE, BLOOD: LIPASE: 32 U/L (ref 11–51)

## 2015-04-03 NOTE — ED Notes (Signed)
Pt complains of lower abdominal pain for two days ans now right flank pain

## 2015-04-04 LAB — PREGNANCY, URINE: Preg Test, Ur: NEGATIVE

## 2015-04-04 LAB — URINALYSIS, ROUTINE W REFLEX MICROSCOPIC
Bilirubin Urine: NEGATIVE
Glucose, UA: NEGATIVE mg/dL
HGB URINE DIPSTICK: NEGATIVE
Ketones, ur: NEGATIVE mg/dL
Nitrite: NEGATIVE
PROTEIN: NEGATIVE mg/dL
SPECIFIC GRAVITY, URINE: 1.017 (ref 1.005–1.030)
pH: 5 (ref 5.0–8.0)

## 2015-04-04 LAB — URINE MICROSCOPIC-ADD ON

## 2015-04-04 MED ORDER — LACTINEX PO PACK: PACK | ORAL | Status: DC

## 2015-04-04 NOTE — Discharge Instructions (Signed)
Lactose Intolerance, Adult Lactose is the natural sugar found in milk and milk products, such as cheese and yogurt. Lactose is digested by lactase, an enzyme in your small intestine. Some people do not produce enough lactase to digest lactose. This is called lactose intolerance. Lactose intolerance is different from milk allergy, which is a more serious reaction to the protein in milk.  CAUSES Causes of lactose intolerance may include:   Normal aging. The ability to produce lactase may decline with age, causing lactose intolerance over time.  Being born without the ability to make lactase.   Digestive diseases such as gastroenteritis or inflammatory bowel disease.  Surgery or injuries to your small intestine.  Infection in your intestines.  Certain antibiotic medicines and cancer treatments. SIGNS AND SYMPTOMS  Lactose intolerance can cause uncomfortable symptoms. These are likely to occur within 30 minutes to 2 hours after eating or drinking foods containing lactose. Symptoms of lactose intolerance may include:  Nausea.  Diarrhea.  Abdominal cramps or pain.  Bloating.   Gas.  DIAGNOSIS  There are several tests your health care provider can do to diagnose lactose intolerance. These tests include a hydrogen breath test and stool acidity test.  TREATMENT  No treatment can improve your body's ability to produce lactase. However, your symptoms can be controlled by limiting or avoiding milk products and other sources of lactose and adjusting your diet. Lactose-free milk is often tolerated. Lactose digestion may also be improved by adding lactase drops to regular milk or by taking lactase tablets when dairy products are consumed. Tolerance to lactose is individual. Some people may be able to eat or drink small amounts of products with lactose, while other may need to avoid lactose entirely. Talk to your health care provider about what is best for you.  HOME CARE INSTRUCTIONS  Limit  or avoidfoods, beverages, and medicines containing lactose as directed by your health care provider.   Read food and medicine labels carefully to avoid products containing lactose, milk solids, casein, or whey.  If you eliminate dairy products, replace the protein, calcium, vitamin D, and other nutrients they contain through other foods. A registered dietitian or your health care provider can help you adjust your diet.  Choose a milk substitute that is fortified with calcium and vitamin D. Be aware that soy milk contains high quality protein, while milks made from nuts or grains contain very little protein.  Use lactase drops or tablets if directed by your health care provider. SEEK MEDICAL CARE IF: You have no relief from your symptoms after eliminating milk products and other sources of lactose.    This information is not intended to replace advice given to you by your health care provider. Make sure you discuss any questions you have with your health care provider.   Document Released: 12/28/2004 Document Revised: 01/18/2014 Document Reviewed: 03/30/2013 Elsevier Interactive Patient Education 2016 [employer]  

## 2015-04-04 NOTE — ED Provider Notes (Signed)
CSN: [id number]     Arrival date & time 04/03/15  2157 History   First MD Initiated Contact with Patient 04/04/15 0023     Chief Complaint  Patient presents with  . Abdominal Pain     (Consider location/radiation/quality/duration/timing/severity/associated sxs/prior Treatment) HPI Comments: 30 year old female with a history of iron deficiency anemia and thrombocytopenia presents to the emergency department for evaluation of 2 weeks of lower abdominal pain. Patient states that pain has been cramping in nature and associated with bloating and increased passing of flatus. Patient also developed a discomfort in her right back 2 days ago. Both the pain in her abdomen and the pain in her back are intermittent. She reports taking Flagyl for symptoms, prescribed by her primary care doctor 2 days ago, without relief. Patient has had some mild nausea. She has been having normal bowel movements. No melena, hematochezia, dysuria, hematuria, vomiting, or a history of abdominal surgeries. Patient denies sexual activity for the last 2 years. She denies concern for STDs. She does state that she has been making homemade ice cream over the past 2 weeks; no history of lactose intolerance.  Patient is a 30 y.o. female presenting with abdominal pain. The history is provided by the patient. No language interpreter was used.  Abdominal Pain Associated symptoms: nausea   Associated symptoms: no constipation, no fever and no vomiting     Past Medical History  Diagnosis Date  . Chickenpox   . Iron deficiency anemia   . Night sweat   . Weight loss, non-intentional   . Early satiety   . Thrombocytopenia (HCC)   . Anemia    History reviewed. No pertinent past surgical history. Family History  Problem Relation Age of Onset  . Hypertension Maternal Grandmother   . Diabetes Maternal Grandmother   . Hypertension Mother   . Hypertension Maternal Uncle   . Other Father     Leukopenia.   Social History  Substance  Use Topics  . Smoking status: Never Smoker   . Smokeless tobacco: Never Used  . Alcohol Use: No   OB History    Gravida Para Term Preterm AB TAB SAB Ectopic Multiple Living   0               Review of Systems  Constitutional: Negative for fever.  Gastrointestinal: Positive for nausea and abdominal pain. Negative for vomiting, constipation and blood in stool.  Musculoskeletal: Positive for back pain.  All other systems reviewed and are negative.   Allergies  Review of patient's allergies indicates no known allergies.  Home Medications   Prior to Admission medications   Medication Sig Start Date End Date Taking? Authorizing Provider  Norethin Ace-Eth Estrad-FE (MINASTRIN 24 FE) 1-20 MG-MCG(24) CHEW Chew 1 tablet by mouth daily.   Yes Historical Provider, MD  Lactobacillus (LACTINEX) PACK Mix 1/2 packet with soft food and give twice a day for 5 days. 04/04/15   Antony MaduraKelly Aariel Ems, PA-C  metroNIDAZOLE (FLAGYL) 500 MG tablet Take 1 tablet (500 mg total) by mouth 3 (three) times daily. Patient not taking: Reported on 04/04/2015 04/01/15   Nelwyn SalisburyStephen A Fry, MD   BP 108/55 mmHg  Pulse 97  Temp(Src) 98.1 F (36.7 C) (Oral)  Resp 20  Ht 5\' 2"  (1.575 m)  Wt 52.164 kg  BMI 21.03 kg/m2  SpO2 100%  LMP 03/07/2015   Physical Exam  Constitutional: She is oriented to person, place, and time. She appears well-developed and well-nourished. No distress.  Nontoxic/nonseptic appearing  HENT:  Head: Normocephalic and atraumatic.  Eyes: Conjunctivae and EOM are normal. No scleral icterus.  Neck: Normal range of motion.  Cardiovascular: Normal rate, regular rhythm and intact distal pulses.   Pulmonary/Chest: Effort normal and breath sounds normal. No respiratory distress. She has no wheezes. She has no rales.  Respirations even and unlabored  Abdominal: Soft. She exhibits no distension. There is no tenderness. There is no rebound and no guarding.  Soft, nontender abdomen  Musculoskeletal: Normal range  of motion.  Neurological: She is alert and oriented to person, place, and time. She exhibits normal muscle tone. Coordination normal.  Skin: Skin is warm and dry. No rash noted. She is not diaphoretic. No erythema. No pallor.  Psychiatric: She has a normal mood and affect. Her behavior is normal.  Nursing note and vitals reviewed.   ED Course  Procedures (including critical care time) Labs Review Labs Reviewed  COMPREHENSIVE METABOLIC PANEL - Abnormal; Notable for the following:    ALT 61 (*)    Alkaline Phosphatase 37 (*)    All other components within normal limits  CBC - Abnormal; Notable for the following:    Platelets 65 (*)    All other components within normal limits  URINALYSIS, ROUTINE W REFLEX MICROSCOPIC (NOT AT [facility]) - Abnormal; Notable for the following:    Color, Urine AMBER (*)    APPearance CLOUDY (*)    Leukocytes, UA MODERATE (*)    All other components within normal limits  URINE MICROSCOPIC-ADD ON - Abnormal; Notable for the following:    Squamous Epithelial / LPF 0-5 (*)    Bacteria, UA RARE (*)    All other components within normal limits  URINE CULTURE  LIPASE, BLOOD  PREGNANCY, URINE    Imaging Review No results found.   I have personally reviewed and evaluated these images and lab results as part of my medical decision-making.   EKG Interpretation None      MDM   Final diagnoses:  Lower abdominal pain  Abdominal bloating    30 year old female presents to the emergency department for evaluation of 2 weeks of lower abdominal cramping with associated right flank discomfort. Patient notes that onset of symptoms began after she started eating homemade ice cream. She has had associated abdominal bloating and increased passing of flatus. Laboratory workup today is noncontributory and stable compared to labs from 2 years ago. No leukocytosis or fever to suggest infectious etiology. Patient has not been sexually active for 2 years. No concern for STDs.  Patient also denies vaginal complaints. Urine pregnancy is negative. Urinalysis does not suggest infection. I suspect that the patient's symptoms are secondary to lactose intolerance. Doubt emergent or surgical etiology. Patient has a soft and nontender abdominal exam today.  Patient to be discharged with prescription for a probiotic and referral to gastroenterology. She was started on Flagyl by her primary care doctor. I have discussed with the patient speaking with her doctor on whether to continue this medication. Return precautions discussed and provided. Patient discharged in satisfactory condition with no unaddressed concerns.   Filed Vitals:   04/03/15 2205  BP: 108/55  Pulse: 97  Temp: 98.1 F (36.7 C)  TempSrc: Oral  Resp: 20  Height:  (1.575 m)  Weight: 52.164 kg  SpO2: 100%     Antony Madura, PA-C 04/04/15 0229  April Palumbo, MD 04/04/15 [phone]

## 2015-04-06 LAB — URINE CULTURE

## 2015-06-16 ENCOUNTER — Ambulatory Visit: Payer: BLUE CROSS/BLUE SHIELD | Admitting: Gastroenterology

## 2016-07-05 ENCOUNTER — Other Ambulatory Visit: Payer: Self-pay | Admitting: Nurse Practitioner

## 2016-09-11 ENCOUNTER — Other Ambulatory Visit: Payer: Self-pay | Admitting: Nurse Practitioner

## 2017-02-24 DIAGNOSIS — R87810 Cervical high risk human papillomavirus (HPV) DNA test positive: Secondary | ICD-10-CM | POA: Insufficient documentation

## 2018-05-10 ENCOUNTER — Other Ambulatory Visit: Payer: Self-pay

## 2018-05-10 ENCOUNTER — Emergency Department (HOSPITAL_COMMUNITY)
Admission: EM | Admit: 2018-05-10 | Discharge: 2018-05-10 | Disposition: A | Discharge status: Home / Self Care | Payer: Medicaid Other | Attending: Emergency Medicine | Admitting: Emergency Medicine

## 2018-05-10 ENCOUNTER — Encounter (HOSPITAL_COMMUNITY): Payer: Self-pay | Admitting: Emergency Medicine

## 2018-05-10 DIAGNOSIS — Z79899 Other long term (current) drug therapy: Secondary | ICD-10-CM | POA: Diagnosis not present

## 2018-05-10 DIAGNOSIS — O26812 Pregnancy related exhaustion and fatigue, second trimester: Secondary | ICD-10-CM | POA: Diagnosis present

## 2018-05-10 DIAGNOSIS — O99282 Endocrine, nutritional and metabolic diseases complicating pregnancy, second trimester: Secondary | ICD-10-CM | POA: Diagnosis not present

## 2018-05-10 DIAGNOSIS — R55 Syncope and collapse: Secondary | ICD-10-CM | POA: Diagnosis not present

## 2018-05-10 DIAGNOSIS — Z3A20 20 weeks gestation of pregnancy: Secondary | ICD-10-CM | POA: Insufficient documentation

## 2018-05-10 DIAGNOSIS — E860 Dehydration: Secondary | ICD-10-CM | POA: Diagnosis not present

## 2018-05-10 DIAGNOSIS — Z862 Personal history of diseases of the blood and blood-forming organs and certain disorders involving the immune mechanism: Secondary | ICD-10-CM | POA: Insufficient documentation

## 2018-05-10 HISTORY — DX: Hypertrophic scar: L91.0

## 2018-05-10 HISTORY — DX: Herpesviral infection of urogenital system, unspecified: A60.00

## 2018-05-10 HISTORY — DX: Anogenital (venereal) warts: A63.0

## 2018-05-10 LAB — RH IG WORKUP (INCLUDES ABO/RH)
ABO/RH(D): O POS
Gestational Age(Wks): 20

## 2018-05-10 LAB — COMPREHENSIVE METABOLIC PANEL
ALT: 14 U/L (ref 0–44)
AST: 16 U/L (ref 15–41)
Albumin: 2.5 g/dL — ABNORMAL LOW (ref 3.5–5.0)
Alkaline Phosphatase: 42 U/L (ref 38–126)
Anion gap: 8 (ref 5–15)
BUN: 11 mg/dL (ref 6–20)
CO2: 19 mmol/L — ABNORMAL LOW (ref 22–32)
Calcium: 7.7 mg/dL — ABNORMAL LOW (ref 8.9–10.3)
Chloride: 110 mmol/L (ref 98–111)
Creatinine, Ser: 0.59 mg/dL (ref 0.44–1.00)
GFR calc Af Amer: >60 mL/min (ref >60)
GFR calc non Af Amer: >60 mL/min (ref >60)
Glucose, Bld: 78 mg/dL (ref 70–99)
Potassium: 3.6 mmol/L (ref 3.5–5.1)
Sodium: 137 mmol/L (ref 135–145)
Total Bilirubin: 0.3 mg/dL (ref 0.3–1.2)
Total Protein: 5.2 g/dL — ABNORMAL LOW (ref 6.5–8.1)

## 2018-05-10 LAB — CBC WITH DIFFERENTIAL/PLATELET
Abs Immature Granulocytes: 0.24 10*3/uL — ABNORMAL HIGH (ref 0.00–0.07)
Basophils Absolute: 0 10*3/uL (ref 0.0–0.1)
Basophils Relative: 0 %
Eosinophils Absolute: 0.1 10*3/uL (ref 0.0–0.5)
Eosinophils Relative: 1 %
HCT: 33.5 % — ABNORMAL LOW (ref 36.0–46.0)
Hemoglobin: 10.7 g/dL — ABNORMAL LOW (ref 12.0–15.0)
Immature Granulocytes: 2 %
Lymphocytes Relative: 36 %
Lymphs Abs: 3.7 10*3/uL (ref 0.7–4.0)
MCH: 31.3 pg (ref 26.0–34.0)
MCHC: 31.9 g/dL (ref 30.0–36.0)
MCV: 98 fL (ref 80.0–100.0)
Monocytes Absolute: 0.8 10*3/uL (ref 0.1–1.0)
Monocytes Relative: 8 %
Neutro Abs: 5.3 10*3/uL (ref 1.7–7.7)
Neutrophils Relative %: 53 %
Platelets: 108 10*3/uL — ABNORMAL LOW (ref 150–400)
RBC: 3.42 MIL/uL — ABNORMAL LOW (ref 3.87–5.11)
RDW: 14.5 % (ref 11.5–15.5)
WBC: 10 10*3/uL (ref 4.0–10.5)
nRBC: 0 % (ref 0.0–0.2)

## 2018-05-10 LAB — URINALYSIS, ROUTINE W REFLEX MICROSCOPIC
Bacteria, UA: NONE SEEN
Bilirubin Urine: NEGATIVE
Glucose, UA: NEGATIVE mg/dL
Ketones, ur: NEGATIVE mg/dL
Leukocytes,Ua: NEGATIVE
Nitrite: NEGATIVE
Protein, ur: NEGATIVE mg/dL
RBC / HPF: >50 RBC/hpf — ABNORMAL HIGH (ref 0–5)
Specific Gravity, Urine: 1.01 (ref 1.005–1.030)
pH: 8 (ref 5.0–8.0)

## 2018-05-10 LAB — CBG MONITORING, ED: Glucose-Capillary: 92 mg/dL (ref 70–99)

## 2018-05-10 MED ORDER — SODIUM CHLORIDE 0.9 % IV SOLN: 1000.0000 mL | INTRAVENOUS | Status: DC

## 2018-05-10 MED ORDER — SODIUM CHLORIDE 0.9 % IV BOLUS
1000.0000 mL | Freq: Once | INTRAVENOUS | Status: AC
  Administered 2018-05-10: 13:00:00 1000 mL via INTRAVENOUS

## 2018-05-10 MED ORDER — SODIUM CHLORIDE 0.9 % IV BOLUS
1000.0000 mL | Freq: Once | INTRAVENOUS | Status: AC
  Administered 2018-05-10: 1000 mL via INTRAVENOUS

## 2018-05-10 MED ORDER — SODIUM CHLORIDE 0.9 % IV BOLUS (SEPSIS)
1000.0000 mL | Freq: Once | INTRAVENOUS | Status: AC
  Administered 2018-05-10: 15:00:00 1000 mL via INTRAVENOUS

## 2018-05-10 NOTE — ED Notes (Signed)
Pt walked around the nurses station with no difficulties. Pt's vital signs prior to ambulating were P-110, R-16, BP-122/77, O2 sat-100% on RA. Pt's heart rate increased to 120-125bpm during ambulation. Pt's vital signs after ambulation were P-117, R-17, BP-125/68, O2 sat 100% on RA.

## 2018-05-10 NOTE — ED Notes (Signed)
ED Provider at bedside. 
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
## 2018-05-10 NOTE — Discharge Instructions (Addendum)
Your blood pressure was extremely low on admission to the emergency department.  You received IV fluids, and your blood pressure seems to be much improved.  Your oxygen level is within normal limits.  Your hemoglobin and hematocrit are within normal limits.  Your electrolytes are also within normal limits.  I discussed your case with Ms. Henreitta Leber, PA-C.  Please call the office on tomorrow and set up an appointment as soon as possible.  If you should begin to feel the sensation of passing out again, please go to the [facility] at the [facility].  Please increase fluids.  Please eat 3 meals daily, plus a bedtime snack.

## 2018-05-10 NOTE — ED Notes (Addendum)
NT went to attempt in and out cath and was unsuccessful. She reported to RN and this RN along with another RN went in to attempt in and out cath. Unsuccessful again. Pt's anatomy is difficult to assess. Pt has growth and scarring to genitalia. Pt reports she has had this all her life but denies it every being looked at by a MD. Melinda [name] came to bedside and was notified of RNs findings. PA assessed and said it was okay for pt to get up to bedside commode with assistance to urinate for sample.
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
## 2018-05-12 LAB — URINE CULTURE
Culture: NO GROWTH
Special Requests: NORMAL

## 2018-05-25 ENCOUNTER — Other Ambulatory Visit: Payer: Self-pay

## 2018-05-25 ENCOUNTER — Telehealth: Payer: Self-pay | Admitting: *Deleted

## 2018-05-25 ENCOUNTER — Inpatient Hospital Stay (HOSPITAL_COMMUNITY)
Admission: AD | Admit: 2018-05-25 | Discharge: 2018-05-25 | Disposition: A | Discharge status: Home / Self Care | Payer: Medicaid Other | Attending: Obstetrics and Gynecology | Admitting: Obstetrics and Gynecology

## 2018-05-25 ENCOUNTER — Encounter (HOSPITAL_COMMUNITY): Payer: Self-pay | Admitting: *Deleted

## 2018-05-25 DIAGNOSIS — O99282 Endocrine, nutritional and metabolic diseases complicating pregnancy, second trimester: Secondary | ICD-10-CM | POA: Diagnosis not present

## 2018-05-25 DIAGNOSIS — Z833 Family history of diabetes mellitus: Secondary | ICD-10-CM | POA: Diagnosis not present

## 2018-05-25 DIAGNOSIS — E162 Hypoglycemia, unspecified: Secondary | ICD-10-CM | POA: Insufficient documentation

## 2018-05-25 DIAGNOSIS — O9989 Other specified diseases and conditions complicating pregnancy, childbirth and the puerperium: Secondary | ICD-10-CM

## 2018-05-25 DIAGNOSIS — O99412 Diseases of the circulatory system complicating pregnancy, second trimester: Secondary | ICD-10-CM | POA: Insufficient documentation

## 2018-05-25 DIAGNOSIS — R55 Syncope and collapse: Secondary | ICD-10-CM | POA: Diagnosis not present

## 2018-05-25 DIAGNOSIS — R002 Palpitations: Secondary | ICD-10-CM

## 2018-05-25 DIAGNOSIS — O99891 Other specified diseases and conditions complicating pregnancy: Secondary | ICD-10-CM

## 2018-05-25 DIAGNOSIS — Z362 Encounter for other antenatal screening follow-up: Secondary | ICD-10-CM | POA: Diagnosis not present

## 2018-05-25 DIAGNOSIS — M549 Dorsalgia, unspecified: Secondary | ICD-10-CM | POA: Diagnosis not present

## 2018-05-25 DIAGNOSIS — R000 Tachycardia, unspecified: Secondary | ICD-10-CM | POA: Insufficient documentation

## 2018-05-25 DIAGNOSIS — Z3A25 25 weeks gestation of pregnancy: Secondary | ICD-10-CM | POA: Insufficient documentation

## 2018-05-25 DIAGNOSIS — O26892 Other specified pregnancy related conditions, second trimester: Secondary | ICD-10-CM

## 2018-05-25 HISTORY — DX: Unspecified abnormal cytological findings in specimens from vagina: R87.629

## 2018-05-25 LAB — WET PREP, GENITAL
Clue Cells Wet Prep HPF POC: NONE SEEN
Sperm: NONE SEEN
Trich, Wet Prep: NONE SEEN
Yeast Wet Prep HPF POC: NONE SEEN

## 2018-05-25 LAB — GLUCOSE, CAPILLARY
Glucose-Capillary: 56 mg/dL — ABNORMAL LOW (ref 70–99)
Glucose-Capillary: 55 mg/dL — ABNORMAL LOW (ref 70–99)

## 2018-05-25 LAB — URINALYSIS, ROUTINE W REFLEX MICROSCOPIC
Bilirubin Urine: NEGATIVE
Glucose, UA: NEGATIVE mg/dL
Hgb urine dipstick: NEGATIVE
Ketones, ur: NEGATIVE mg/dL
Leukocytes,Ua: NEGATIVE
Nitrite: NEGATIVE
Protein, ur: NEGATIVE mg/dL
Specific Gravity, Urine: 1.013 (ref 1.005–1.030)
pH: 8 (ref 5.0–8.0)

## 2018-05-25 MED ORDER — LACTATED RINGERS IV BOLUS
1000.0000 mL | Freq: Once | INTRAVENOUS | Status: AC
  Administered 2018-05-25: 10:00:00 1000 mL via INTRAVENOUS

## 2018-05-25 MED ORDER — COMFORT FIT MATERNITY SUPP MED MISC: 1.0000 | Freq: Every day | 0 refills | Status: DC

## 2018-05-25 NOTE — Progress Notes (Signed)
Vaginal cultures obtained by M. Bhambri, CNM 

## 2018-05-25 NOTE — Telephone Encounter (Signed)
Enrolled for [employer] to ship a 30 day cardiac event monitor to your home per Dr. Mayford Knife.  Self Pay price of  $325 and information given.  Call [employer] billing department at [phone] to set up payment plan or provide Medicaid information if applicable.  Instructed to review monitor instructions to apply monitor and contact [employer] to send baseline recording.

## 2018-05-25 NOTE — Progress Notes (Signed)
EKG obtained

## 2018-05-25 NOTE — Progress Notes (Signed)
Outpatient order for 30 day event monitor for palpitations and tachycardia placed per Dr. Armanda Magic

## 2018-05-25 NOTE — MAU Provider Note (Signed)
History     CSN: [id number]  Arrival date and time: 05/25/18 4098   First Provider Initiated Contact with Patient 05/25/18 [phone]      Chief Complaint  Patient presents with  . Back Pain   33 y.o G1  gestation presenting with LBP. Sx started 1 week ago. Pain is intermittent, lasting for a few hrs at times. Not having pain now. Denies urinary sx. No fevers. No VB, LOF, or abd pain. +FM. Reports last normal menses was in December and had 2 days of bleeding in March.    OB History    Gravida  1   Para      Term      Preterm      AB      Living        SAB      TAB      Ectopic      Multiple      Live Births              Past Medical History:  Diagnosis Date  . Anemia   . Chickenpox   . Early satiety   . Herpes genitalia   . HPV (human papilloma virus) anogenital infection   . Iron deficiency anemia   . Night sweat   . Scarring, keloid    external genitalia  . Thrombocytopenia (HCC)   . Vaginal Pap smear, abnormal   . Weight loss, non-intentional     Past Surgical History:  Procedure Laterality Date  . WISDOM TOOTH EXTRACTION      Family History  Problem Relation Age of Onset  . Hypertension Maternal Grandmother   . Diabetes Maternal Grandmother   . Hypertension Mother   . Other Father        Leukopenia.  . Hypertension Maternal Uncle     Social History   Tobacco Use  . Smoking status: Never Smoker  . Smokeless tobacco: Never Used  Substance Use Topics  . Alcohol use: No    Alcohol/week: 0.0 standard drinks  . Drug use: No    Allergies: No Known Allergies  Medications Prior to Admission  Medication Sig Dispense Refill Last Dose  . Prenatal Vit-Fe Fumarate-FA (PRENATAL VITAMINS PO) Take 1 tablet by mouth daily.   05/24/2018 at 0930  . Lactobacillus (LACTINEX) PACK Mix 1/2 packet with soft food and give twice a day for 5 days. (Patient not taking: Reported on 05/10/2018) 5 each 0 Not Taking at Unknown time  . metroNIDAZOLE  (FLAGYL) 500 MG tablet Take 1 tablet (500 mg total) by mouth 3 (three) times daily. (Patient not taking: Reported on 04/04/2015) 21 tablet 0 Not Taking at Unknown time  . Norethin Ace-Eth Estrad-FE (MINASTRIN 24 FE) 1-20 MG-MCG(24) CHEW Chew 1 tablet by mouth daily.   Not Taking at Unknown time    Review of Systems  Constitutional: Negative for fever.  Respiratory: Negative for shortness of breath.   Cardiovascular: Negative for chest pain.  Gastrointestinal: Negative for abdominal pain.  Genitourinary: Negative for vaginal bleeding.  Musculoskeletal: Positive for back pain.   Physical Exam   Blood pressure 108/64, pulse 96, temperature 98.5 F (36.9 C), temperature source Oral, resp. rate 20, height  (1.575 m), weight 58.5 kg, last menstrual period 12/25/2017, SpO2 100 %. Patient Vitals for the past 24 hrs:  BP Temp Temp src Pulse Resp SpO2 Height Weight  05/25/18 1135 108/64 98.5 F (36.9 C) Oral 96 20 - - -  05/25/18 1035 104/61 - - Marland Kitchen)  119 - - - -  05/25/18 1025 - - - - - 100 % - -  05/25/18 1020 99/61 - - 95 - - - -  05/25/18 1010 98/63 - - 82 - 100 % - -  05/25/18 1004 - - - - - 100 % - -  05/25/18 1000 (!) 89/53 - - 95 - 100 % - -  05/25/18 0955 (!) 85/45 - - 98 - 99 % - -  05/25/18 0951 (!) 68/50 - - (!) 114 - 95 % - -  05/25/18 0845 125/77 98.7 F (37.1 C) - (!) 115 16 - 5\' 2"  (1.575 m) 58.5 kg   Physical Exam  Nursing note and vitals reviewed. Constitutional: She is oriented to person, place, and time. She appears well-developed and well-nourished. No distress.  HENT:  Head: Normocephalic and atraumatic.  Neck: Normal range of motion.  Cardiovascular: Regular rhythm and normal heart sounds.  Respiratory: Effort normal. No respiratory distress.  GI: Soft. She exhibits no distension. There is no abdominal tenderness. There is no CVA tenderness.  Musculoskeletal: Normal range of motion.     Cervical back: Normal.     Thoracic back: Normal.     Lumbar back:  Normal.  Neurological: She is alert and oriented to person, place, and time.  Skin: Skin is warm and dry.  Psychiatric: She has a normal mood and affect.  EFM: 160 bpm, mod variability, no accels, no decels Toco: none  Limited bedside US: viable, active fetus, +cardiac activity, subj. nml AFV, BPD measures [id number]  Results for orders placed or performed during the hospital encounter of 05/25/18 (from the past 24 hour(s))  Urinalysis, Routine w reflex microscopic     Status: Abnormal   Collection Time: 05/25/18  9:12 AM  Result Value Ref Range   Color, Urine YELLOW YELLOW   APPearance CLOUDY (A) CLEAR   Specific Gravity, Urine 1.013 1.005 - 1.030   pH 8.0 5.0 - 8.0   Glucose, UA NEGATIVE NEGATIVE mg/dL   Hgb urine dipstick NEGATIVE NEGATIVE   Bilirubin Urine NEGATIVE NEGATIVE   Ketones, ur NEGATIVE NEGATIVE mg/dL   Protein, ur NEGATIVE NEGATIVE mg/dL   Nitrite NEGATIVE NEGATIVE   Leukocytes,Ua NEGATIVE NEGATIVE  Glucose, capillary     Status: Abnormal   Collection Time: 05/25/18  9:57 AM  Result Value Ref Range   Glucose-Capillary 56 (L) 70 - 99 mg/dL  Wet prep, genital     Status: Abnormal   Collection Time: 05/25/18 10:40 AM  Result Value Ref Range   Yeast Wet Prep HPF POC NONE SEEN NONE SEEN   Trich, Wet Prep NONE SEEN NONE SEEN   Clue Cells Wet Prep HPF POC NONE SEEN NONE SEEN   WBC, Wet Prep HPF POC MODERATE (A) NONE SEEN   Sperm NONE SEEN   Glucose, capillary     Status: Abnormal   Collection Time: 05/25/18 11:11 AM  Result Value Ref Range   Glucose-Capillary 55 (L) 70 - 99 mg/dL   MAU Course  Procedures  MDM Labs ordered. Once EFM applied pt began to feel anxious and maternal HR was noted in 150-160s, auscultated with regular rhythm but tachy, EKG ordered. BS 56, pt reports no food today, juice given.   Pt feeling better after IVF bolus and o2. HR now 90s. Consult with Dr. Mayford Knifeurner (cards), EKG normal but will arrange f/u with them (patch monitor x1 month will be  mailed to her, and office f/u in 4 wks).   UI noted  on toco, cervix closed/long, cultures obtained.   Discussed plan for cardiology f/u with pt. Instructed to eat and drink often to avoid hypoglycemia which can cause near syncope sx. Pt prefers to f/u with [facility] to start care since still awaiting pregnancy Medicaid. Back pain likely MSK, discussed comfort measures.   Stable for discharge home.  Assessment and Plan   1. [redacted] weeks gestation of pregnancy   2. Back pain affecting pregnancy in second trimester   3. Near syncope   4. Hypoglycemia    Discharge home Follow up at [facility] to start care Follow up with MFM for anatomy US Follow up with Cardiology- Dr. Mayford Knife to arrange Rx Maternity belt Tylenol/heat prn PTL precautions Return to [facility] for near syncope sx  Allergies as of 05/25/2018   No Known Allergies     Medication List    STOP taking these medications   Lactinex Pack   metroNIDAZOLE 500 MG tablet Commonly known as:  FLAGYL   Minastrin 24 Fe 1-20 MG-MCG(24) Chew Generic drug:  Norethin Ace-Eth Estrad-FE     TAKE these medications   Comfort Fit Maternity Supp Med Misc 1 Device by Does not apply route daily.   PRENATAL VITAMINS PO Take 1 tablet by mouth daily.      Donette Larry, CNM 05/25/2018, 12:10 PM

## 2018-05-25 NOTE — Discharge Instructions (Signed)
Second Trimester of Pregnancy  The second trimester is from week 14 through week 27 (month 4 through 6). This is often the time in pregnancy that you feel your best. Often times, morning sickness has lessened or quit. You may have more energy, and you may get hungry more often. Your unborn baby is growing rapidly. At the end of the sixth month, he or she is about 9 inches long and weighs about 1 pounds. You will likely feel the baby move between 18 and 20 weeks of pregnancy. Follow these instructions at home: Medicines  Take over-the-counter and prescription medicines only as told by your doctor. Some medicines are safe and some medicines are not safe during pregnancy.  Take a prenatal vitamin that contains at least 600 micrograms (mcg) of folic acid.  If you have trouble pooping (constipation), take medicine that will make your stool soft (stool softener) if your doctor approves. Eating and drinking   Eat regular, healthy meals.  Avoid raw meat and uncooked cheese.  If you get low calcium from the food you eat, talk to your doctor about taking a daily calcium supplement.  Avoid foods that are high in fat and sugars, such as fried and sweet foods.  If you feel sick to your stomach (nauseous) or throw up (vomit): ? Eat 4 or 5 small meals a day instead of 3 large meals. ? Try eating a few soda crackers. ? Drink liquids between meals instead of during meals.  To prevent constipation: ? Eat foods that are high in fiber, like fresh fruits and vegetables, whole grains, and beans. ? Drink enough fluids to keep your pee (urine) clear or pale yellow. Activity  Exercise only as told by your doctor. Stop exercising if you start to have cramps.  Do not exercise if it is too hot, too humid, or if you are in a place of great height (high altitude).  Avoid heavy lifting.  Wear low-heeled shoes. Sit and stand up straight.  You can continue to have sex unless your doctor tells you not  to. Relieving pain and discomfort  Wear a good support bra if your breasts are tender.  Take warm water baths (sitz baths) to soothe pain or discomfort caused by hemorrhoids. Use hemorrhoid cream if your doctor approves.  Rest with your legs raised if you have leg cramps or low back pain.  If you develop puffy, bulging veins (varicose veins) in your legs: ? Wear support hose or compression stockings as told by your doctor. ? Raise (elevate) your feet for 15 minutes, 3-4 times a day. ? Limit salt in your food. Prenatal care  Write down your questions. Take them to your prenatal visits.  Keep all your prenatal visits as told by your doctor. This is important. Safety  Wear your seat belt when driving.  Make a list of emergency phone numbers, including numbers for family, friends, the hospital, and police and fire departments. General instructions  Ask your doctor about the right foods to eat or for help finding a counselor, if you need these services.  Ask your doctor about local prenatal classes. Begin classes before month 6 of your pregnancy.  Do not use hot tubs, steam rooms, or saunas.  Do not douche or use tampons or scented sanitary pads.  Do not cross your legs for long periods of time.  Visit your dentist if you have not done so. Use a soft toothbrush to brush your teeth. Floss gently.  Avoid all smoking, herbs,   and alcohol. Avoid drugs that are not approved by your doctor.  Do not use any products that contain nicotine or tobacco, such as cigarettes and e-cigarettes. If you need help quitting, ask your doctor.  Avoid cat litter boxes and soil used by cats. These carry germs that can cause birth defects in the baby and can cause a loss of your baby (miscarriage) or stillbirth. Contact a doctor if:  You have mild cramps or pressure in your lower belly.  You have pain when you pee (urinate).  You have bad smelling fluid coming from your vagina.  You continue to  feel sick to your stomach (nauseous), throw up (vomit), or have watery poop (diarrhea).  You have a nagging pain in your belly area.  You feel dizzy. Get help right away if:  You have a fever.  You are leaking fluid from your vagina.  You have spotting or bleeding from your vagina.  You have severe belly cramping or pain.  You lose or gain weight rapidly.  You have trouble catching your breath and have chest pain.  You notice sudden or extreme puffiness (swelling) of your face, hands, ankles, feet, or legs.  You have not felt the baby move in over an hour.  You have severe headaches that do not go away when you take medicine.  You have trouble seeing. Summary  The second trimester is from week 14 through week 27 (months 4 through 6). This is often the time in pregnancy that you feel your best.  To take care of yourself and your unborn baby, you will need to eat healthy meals, take medicines only if your doctor tells you to do so, and do activities that are safe for you and your baby.  Call your doctor if you get sick or if you notice anything unusual about your pregnancy. Also, call your doctor if you need help with the right food to eat, or if you want to know what activities are safe for you. This information is not intended to replace advice given to you by your health care provider. Make sure you discuss any questions you have with your health care provider. Document Released: 03/24/2009 Document Revised: 02/03/2016 Document Reviewed: 02/03/2016 Elsevier Interactive Patient Education  2019 [employer].   Eating Plan for Pregnant Women While you are pregnant, your body requires additional nutrition to help support your growing baby. You also have a higher need for some vitamins and minerals, such as folic acid, calcium, iron, and vitamin D. Eating a healthy, well-balanced diet is very important for your health and your baby's health. Your need for extra calories varies for  the three 89-month segments of your pregnancy (trimesters). For most women, it is recommended to consume:  150 extra calories a day during the first trimester.  300 extra calories a day during the second trimester.  300 extra calories a day during the third trimester. What are tips for following this plan?   Do not try to lose weight or go on a diet during pregnancy.  Limit your overall intake of foods that have "empty calories." These are foods that have little nutritional value, such as sweets, desserts, candies, and sugar-sweetened beverages.  Eat a variety of foods (especially fruits and vegetables) to get a full range of vitamins and minerals.  Take a prenatal vitamin to help meet your additional vitamin and mineral needs during pregnancy, specifically for folic acid, iron, calcium, and vitamin D.  Remember to stay active. Ask your  health care provider what types of exercise and activities are safe for you.  Practice good food safety and cleanliness. Wash your hands before you eat and after you prepare raw meat. Wash all fruits and vegetables well before peeling or eating. Taking these actions can help to prevent food-borne illnesses that can be very dangerous to your baby, such as listeriosis. Ask your health care provider for more information about listeriosis. What does 150 extra calories look like? Healthy options that provide 150 extra calories each day could be any of the following:  6-8 oz (170-230 g) of plain low-fat yogurt with  cup of berries.  1 apple with 2 teaspoons (11 g) of peanut butter.  Cut-up vegetables with  cup (60 g) of hummus.  8 oz (230 mL) or 1 cup of low-fat chocolate milk.  1 stick of string cheese with 1 medium orange.  1 peanut butter and jelly sandwich that is made with one slice of whole-wheat bread and 1 tsp (5 g) of peanut butter. For 300 extra calories, you could eat two of those healthy options each day. What is a healthy amount of weight  to gain? The right amount of weight gain for you is based on your BMI before you became pregnant. If your BMI:  Was less than 18 (underweight), you should gain 28-40 lb (13-18 kg).  Was 18-24.9 (normal), you should gain 25-35 lb (11-16 kg).  Was 25-29.9 (overweight), you should gain 15-25 lb (7-11 kg).  Was 30 or greater (obese), you should gain 11-20 lb (5-9 kg). What if I am having twins or multiples? Generally, if you are carrying twins or multiples:  You may need to eat 300-600 extra calories a day.  The recommended range for total weight gain is 25-54 lb (11-25 kg), depending on your BMI before pregnancy.  Talk with your health care provider to find out about nutritional needs, weight gain, and exercise that is right for you. What foods can I eat?  Grains All grains. Choose whole grains, such as whole-wheat bread, oatmeal, or brown rice. Vegetables All vegetables. Eat a variety of colors and types of vegetables. Remember to wash your vegetables well before peeling or eating. Fruits All fruits. Eat a variety of colors and types of fruit. Remember to wash your fruits well before peeling or eating. Meats and other protein foods Lean meats, including chicken, [place], fish, and lean cuts of beef, veal, or pork. If you eat fish or seafood, choose options that are higher in omega-3 fatty acids and lower in mercury, such as salmon, herring, mussels, trout, sardines, pollock, shrimp, crab, and lobster. Tofu. Tempeh. Beans. Eggs. Peanut butter and other nut butters. Make sure that all meats, poultry, and eggs are cooked to food-safe temperatures or "well-done." Two or more servings of fish are recommended each week in order to get the most benefits from omega-3 fatty acids that are found in seafood. Choose fish that are lower in mercury. You can find more information online:  PumpkinSearch.com.ee Dairy Pasteurized milk and milk alternatives (such as almond milk). Pasteurized yogurt and pasteurized  cheese. Cottage cheese. Sour cream. Beverages Water. Juices that contain 100% fruit juice or vegetable juice. Caffeine-free teas and decaffeinated coffee. Drinks that contain caffeine are okay to drink, but it is better to avoid caffeine. Keep your total caffeine intake to less than 200 mg each day (which is 12 oz or 355 mL of coffee, tea, or soda) or the limit as told by your health care provider.  Fats and oils Fats and oils are okay to include in moderation. Sweets and desserts Sweets and desserts are okay to include in moderation. Seasoning and other foods All pasteurized condiments. The items listed above may not be a complete list of recommended foods and beverages. Contact your dietitian for more options. What foods are not recommended? Vegetables Raw (unpasteurized) vegetable juices. Fruits Unpasteurized fruit juices. Meats and other protein foods Lunch meats, bologna, hot dogs, or other deli meats. (If you must eat those meats, reheat them until they are steaming hot.) Refrigerated pat, meat spreads from a meat counter, smoked seafood that is found in the refrigerated section of a store. Raw or undercooked meats, poultry, and eggs. Raw fish, such as sushi or sashimi. Fish that have high mercury content, such as tilefish, shark, swordfish, and king mackerel. To learn more about mercury in fish, talk with your health care provider or look for online resources, such as:  PumpkinSearch.com.ee Dairy Raw (unpasteurized) milk and any foods that have raw milk in them. Soft cheeses, such as feta, queso blanco, queso fresco, Brie, Camembert cheeses, blue-veined cheeses, and Panela cheese (unless it is made with pasteurized milk, which must be stated on the label). Beverages Alcohol. Sugar-sweetened beverages, such as sodas, teas, or energy drinks. Seasoning and other foods Homemade fermented foods and drinks, such as pickles, sauerkraut, or kombucha drinks. (Store-bought pasteurized versions of these  are okay.) Salads that are made in a store or deli, such as ham salad, chicken salad, egg salad, tuna salad, and seafood salad. The items listed above may not be a complete list of foods and beverages to avoid. Contact your dietitian for more information. Where to find more information To calculate the number of calories you need based on your height, weight, and activity level, you can use an online calculator such as:  PackageNews.is To calculate how much weight you should gain during pregnancy, you can use an online pregnancy weight gain calculator such as:  http://jones-berg.com/ Summary  While you are pregnant, your body requires additional nutrition to help support your growing baby.  Eat a variety of foods, especially fruits and vegetables to get a full range of vitamins and minerals.  Practice good food safety and cleanliness. Wash your hands before you eat and after you prepare raw meat. Wash all fruits and vegetables well before peeling or eating. Taking these actions can help to prevent food-borne illnesses, such as listeriosis, that can be very dangerous to your baby.  Do not eat raw meat or fish. Do not eat fish that have high mercury content, such as tilefish, shark, swordfish, and king mackerel. Do not eat unpasteurized (raw) dairy.  Take a prenatal vitamin to help meet your additional vitamin and mineral needs during pregnancy, specifically for folic acid, iron, calcium, and vitamin D. This information is not intended to replace advice given to you by your health care provider. Make sure you discuss any questions you have with your health care provider. Document Released: 10/12/2013 Document Revised: 09/24/2016 Document Reviewed: 09/24/2016 Elsevier Interactive Patient Education  2019 Elsevier Inc.   Back Pain in Pregnancy Back pain during pregnancy is common. Back pain may be caused by several factors that are related to  changes during your pregnancy. Follow these instructions at home: Managing pain, stiffness, and swelling      If directed, for sudden (acute) back pain, put ice on the painful area. ? Put ice in a plastic bag. ? Place a towel between your skin and the  bag. ? Leave the ice on for 20 minutes, 2-3 times per day.  If directed, apply heat to the affected area before you exercise. Use the heat source that your health care provider recommends, such as a moist heat pack or a heating pad. ? Place a towel between your skin and the heat source. ? Leave the heat on for 20-30 minutes. ? Remove the heat if your skin turns bright red. This is especially important if you are unable to feel pain, heat, or cold. You may have a greater risk of getting burned.  If directed, massage the affected area. Activity  Exercise as told by your health care provider. Gentle exercise is the best way to prevent or manage back pain.  Listen to your body when lifting. If lifting hurts, ask for help or bend your knees. This uses your leg muscles instead of your back muscles.  Squat down when picking up something from the floor. Do not bend over.  Only use bed rest for short periods as told by your health care provider. Bed rest should only be used for the most severe episodes of back pain. Standing, sitting, and lying down  Do not stand in one place for long periods of time.  Use good posture when sitting. Make sure your head rests over your shoulders and is not hanging forward. Use a pillow on your lower back if necessary.  Try sleeping on your side, preferably the left side, with a pregnancy support pillow or 1-2 regular pillows between your legs. ? If you have back pain after a night's rest, your bed may be too soft. ? A firm mattress may provide more support for your back during pregnancy. General instructions  Do not wear high heels.  Eat a healthy diet. Try to gain weight within your health care provider's  recommendations.  Use a maternity girdle, elastic sling, or back brace as told by your health care provider.  Take over-the-counter and prescription medicines only as told by your health care provider.  Work with a physical therapist or massage therapist to find ways to manage back pain. Acupuncture or massage therapy may be helpful.  Keep all follow-up visits as told by your health care provider. This is important. Contact a health care provider if:  Your back pain interferes with your daily activities.  You have increasing pain in other parts of your body. Get help right away if:  You develop numbness, tingling, weakness, or problems with the use of your arms or legs.  You develop severe back pain that is not controlled with medicine.  You have a change in bowel or bladder control.  You develop shortness of breath, dizziness, or you faint.  You develop nausea, vomiting, or sweating.  You have back pain that is a rhythmic, cramping pain similar to labor pains. Labor pain is usually 1-2 minutes apart, lasts for about 1 minute, and involves a bearing down feeling or pressure in your pelvis.  You have back pain and your water breaks or you have vaginal bleeding.  You have back pain or numbness that travels down your leg.  Your back pain developed after you fell.  You develop pain on one side of your back.  You see blood in your urine.  You develop skin blisters in the area of your back pain. Summary  Back pain may be caused by several factors that are related to changes during your pregnancy.  Follow instructions as told by your health care provider  for managing pain, stiffness, and swelling.  Exercise as told by your health care provider. Gentle exercise is the best way to prevent or manage back pain.  Take over-the-counter and prescription medicines only as told by your health care provider.  Keep all follow-up visits as told by your health care provider. This is  important. This information is not intended to replace advice given to you by your health care provider. Make sure you discuss any questions you have with your health care provider. Document Released: 04/07/2005 Document Revised: 06/15/2017 Document Reviewed: 06/15/2017 Elsevier Interactive Patient Education  2019 Elsevier Inc.    PREGNANCY SUPPORT BELT: You are not alone, Seventy-five percent of women have some sort of abdominal or back pain at some point in their pregnancy. Your baby is growing at a fast pace, which means that your whole body is rapidly trying to adjust to the changes. As your uterus grows, your back may start feeling a bit under stress and this can result in back or abdominal pain that can go from mild, and therefore bearable, to severe pains that will not allow you to sit or lay down comfortably, When it comes to dealing with pregnancy-related pains and cramps, some pregnant women usually prefer natural remedies, which the market is filled with nowadays. For example, wearing a pregnancy support belt can help ease and lessen your discomfort and pain. WHAT ARE THE BENEFITS OF WEARING A PREGNANCY SUPPORT BELT? A pregnancy support belt provides support to the lower portion of the belly taking some of the weight of the growing uterus and distributing to the other parts of your body. It is designed make you comfortable and gives you extra support. Over the years, the pregnancy apparel market has been studying the needs and wants of pregnant women and they have come up with the most comfortable pregnancy support belts that woman could ever ask for. In fact, you will no longer have to wear a stretched-out or bulky pregnancy belt that is visible underneath your clothes and makes you feel even more uncomfortable. Nowadays, a pregnancy support belt is made of comfortable and stretchy materials that will not irritate your skin but will actually make you feel at ease and you will not even notice  you are wearing it. They are easy to put on and adjust during the day and can be worn at night for additional support.  BENEFITS:  Relives Back pain  Relieves Abdominal Muscle and Leg Pain  Stabilizes the Pelvic Ring  Offers a Cushioned Abdominal Lift Pad  Relieves pressure on the Sciatic Nerve Within Minutes WHERE TO GET YOUR PREGNANCY BELT: [employer] [facility] [employer] [phone] @[phone]  [address] [place], [place] [id number]

## 2018-05-25 NOTE — Progress Notes (Signed)
EFM tracing sketchy secondary fetal movement

## 2018-05-25 NOTE — MAU Note (Signed)
Pt presents to MAU with complaints of back pain for a week. Pt states that she just found out she was pregnant [redacted] weeks ago. Pt had an appointment with [facility] but states they cancelled her appointment because her medicaid has not went through, denies any vaginal bleeding

## 2018-05-26 LAB — GC/CHLAMYDIA PROBE AMP (~~LOC~~) NOT AT ARMC
Chlamydia: NEGATIVE
Neisseria Gonorrhea: NEGATIVE

## 2018-06-01 ENCOUNTER — Ambulatory Visit (INDEPENDENT_AMBULATORY_CARE_PROVIDER_SITE_OTHER): Payer: Medicaid Other

## 2018-06-01 DIAGNOSIS — R55 Syncope and collapse: Secondary | ICD-10-CM | POA: Diagnosis not present

## 2018-06-01 DIAGNOSIS — R002 Palpitations: Secondary | ICD-10-CM | POA: Diagnosis not present

## 2018-06-01 DIAGNOSIS — R000 Tachycardia, unspecified: Secondary | ICD-10-CM | POA: Diagnosis not present

## 2018-06-15 ENCOUNTER — Ambulatory Visit (INDEPENDENT_AMBULATORY_CARE_PROVIDER_SITE_OTHER): Payer: Self-pay | Admitting: *Deleted

## 2018-06-15 ENCOUNTER — Other Ambulatory Visit: Payer: Self-pay

## 2018-06-15 DIAGNOSIS — Z3400 Encounter for supervision of normal first pregnancy, unspecified trimester: Secondary | ICD-10-CM | POA: Insufficient documentation

## 2018-06-15 NOTE — Progress Notes (Signed)
      Virtual Visit via Telephone Note I connected with Melinda [name] on 06/15/18 at  1:30 PM EDT by telephone and verified that I am speaking with the correct person using two identifiers.  Location: [facility] Patient: Melinda [name] Provider: Clovis Pu, RN   I discussed the limitations, risks, security and privacy concerns of performing an evaluation and management service by telephone and the availability of in person appointments. I also discussed with the patient that there may be a patient responsible charge related to this service. The patient expressed understanding and agreed to proceed.   History of Present Illness:  PRENATAL INTAKE SUMMARY  Melinda [name] presents today New OB Nurse Interview.  OB History    Gravida  1   Para      Term      Preterm      AB      Living        SAB      TAB      Ectopic      Multiple      Live Births             I have reviewed the patient's medical, obstetrical, social, and family histories, medications, and available lab results.  SUBJECTIVE She has no unusual complaints. Hx of thrombocytopenia; anemia and HSV.   Observations/Objective: Initial nurse interview for history (New OB). Late to Care; [employer] patient.  GENERAL APPEARANCE: oriented to person, place and time, non-face to face interview.  EDD: 09/07/2018 by ultrasound GAL [redacted]w[redacted]d G1P0  Assessment and Plan: Normal pregnancy Prenatal care-[facility] Pnl/HIV  OB Urine Culture GC/CT/PAP at next visit with midwife HgbEval/SMA/CF at next visit with midwife Panorama at next visit 2 hour gtt at next visit Tdap at next visit Advised to enroll in babyscripts and MyChart  Follow Up Instructions:  I discussed the assessment and treatment plan with the patient. The patient was provided an opportunity to ask questions and all were answered. The patient agreed with the plan and demonstrated an understanding of the instructions.   The  patient was advised to call back or seek an in-person evaluation if the symptoms worsen or if the condition fails to improve as anticipated.  I provided 30 minutes of non-face-to-face time during this encounter.   Clovis Pu, RN

## 2018-06-16 ENCOUNTER — Ambulatory Visit (INDEPENDENT_AMBULATORY_CARE_PROVIDER_SITE_OTHER): Payer: Medicaid Other | Admitting: Obstetrics and Gynecology

## 2018-06-16 VITALS — BP 111/56 | HR 106 | Temp 97.3°F | Wt 136.0 lb

## 2018-06-16 DIAGNOSIS — O0930 Supervision of pregnancy with insufficient antenatal care, unspecified trimester: Secondary | ICD-10-CM

## 2018-06-16 DIAGNOSIS — O99113 Other diseases of the blood and blood-forming organs and certain disorders involving the immune mechanism complicating pregnancy, third trimester: Secondary | ICD-10-CM

## 2018-06-16 DIAGNOSIS — D696 Thrombocytopenia, unspecified: Secondary | ICD-10-CM

## 2018-06-16 DIAGNOSIS — Z3400 Encounter for supervision of normal first pregnancy, unspecified trimester: Secondary | ICD-10-CM

## 2018-06-16 DIAGNOSIS — Z3A28 28 weeks gestation of pregnancy: Secondary | ICD-10-CM

## 2018-06-16 MED ORDER — ONE-A-DAY WOMENS PRENATAL 1 28-0.8-235 MG PO CAPS: 1.0000 | ORAL_CAPSULE | Freq: Every day | ORAL | 0 refills | Status: DC

## 2018-06-16 MED ORDER — TETANUS-DIPHTH-ACELL PERTUSSIS 5-2.5-18.5 LF-MCG/0.5 IM SUSP: 0.5000 mL | Freq: Once | INTRAMUSCULAR | 0 refills | Status: AC

## 2018-06-16 NOTE — Patient Instructions (Addendum)

## 2018-06-16 NOTE — Progress Notes (Signed)
Subjective:    Melinda [name] is being seen today for her first obstetrical visit.  This is not a planned pregnancy. She "just found out a month ago." She is at [redacted]w[redacted]d gestation. Her obstetrical history is significant for no PNC, h/o thrombocytopenia, anemia and genital HSV. Relationship with FOB: significant other, not living together. Patient does intend to breast feed. Pregnancy history fully reviewed.  Patient reports dx'd with tachycardia -- being followed by cardiology; has to wear patch monitor for 1 month.  Review of Systems:   Review of Systems  Constitutional: Negative.   HENT: Negative.   Eyes: Negative.   Respiratory: Negative.   Cardiovascular: Positive for palpitations (wearing a patch monitor for 1 month).  Endocrine: Negative.   Genitourinary: Negative.   Musculoskeletal: Negative.   Skin: Negative.   Allergic/Immunologic: Negative.   Neurological: Positive for syncope (when she needs to eat or when "getting too worked up").  Hematological: Negative.   Psychiatric/Behavioral: Negative.     Objective:     BP (!) 111/56   Pulse (!) 106   Temp (!) 97.3 F (36.3 C)   Wt 136 lb (61.7 kg)   LMP 12/25/2017   BMI 24.87 kg/m  Physical Exam  Nursing note and vitals reviewed. Constitutional: She is oriented to person, place, and time. She appears well-developed and well-nourished.  HENT:  Head: Normocephalic and atraumatic.  Eyes: Pupils are equal, round, and reactive to light. Conjunctivae and EOM are normal.  Neck: Normal range of motion. Neck supple.  Cardiovascular: Normal rate, regular rhythm, normal heart sounds and intact distal pulses.  Respiratory: Effort normal and breath sounds normal.  GI: Soft. Bowel sounds are normal.  Genitourinary:    Vulva normal.     Vaginal discharge present.     Genitourinary Comments: Uterus: gravid, S=D, SE: cervix is smooth, pink, no lesions, moderate amt of thick, cervix closed/long/firm, no CMT or friability, no adnexal  tenderness     Musculoskeletal: Normal range of motion.  Neurological: She is alert and oriented to person, place, and time. She has normal reflexes.  Skin: Skin is warm and dry.  Large 6 cm x 4 cm keloid on mons pubis -- unknown etiology  Psychiatric: She has a normal mood and affect. Her behavior is normal. Judgment and thought content normal.    Maternal Exam:  Abdomen: Patient reports no abdominal tenderness. Fundal height is 29.   Fetal presentation: no presenting part  Introitus: Normal vulva. Vagina is positive for vaginal discharge.  Ferning test: not done.  Nitrazine test: not done. Amniotic fluid character: not assessed.  Pelvis: adequate for delivery.   Cervix: Cervix evaluated by sterile speculum exam and digital exam.     Fetal Exam Fetal Monitor Review: Mode: hand-held doppler probe.   Baseline rate: 152 bpm.         Assessment:    Pregnancy: G1P0 Patient Active Problem List   Diagnosis Date Noted  . Supervision of normal first pregnancy, antepartum 06/15/2018  . Herpes simplex 09/11/2013  . 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 Weight loss, non-intentional   . Early satiety   . Night sweat   . Thrombocytopenia (HCC)   . Anemia   . Iron deficiency anemia   . CHICKENPOX, HX OF 04/10/2007       Plan:     Initial labs & [place]. Prenatal vitamins. Problem list reviewed and updated. AFP3 discussed: not indicated d/t GA. Role of ultrasound in pregnancy discussed; fetal survey: ordered. Will be done at [facility]  d/t [facility] program. Amniocentesis discussed: not indicated. The nature [facility] with multiple MDs and other Advanced Practice Providers was explained to patient; also emphasized that residents, students are part of our team.  Discussed optimized OB schedule, My Chart video visits. Follow up in 4 weeks via My Chart. 50% of 40 min visit spent on counseling and coordination of care.     Raelyn Moraolitta  Layne Lebon, MSN, CNM 06/16/2018

## 2018-06-17 ENCOUNTER — Encounter: Payer: Self-pay | Admitting: Obstetrics and Gynecology

## 2018-06-17 DIAGNOSIS — D696 Thrombocytopenia, unspecified: Secondary | ICD-10-CM | POA: Insufficient documentation

## 2018-06-17 DIAGNOSIS — O99119 Other diseases of the blood and blood-forming organs and certain disorders involving the immune mechanism complicating pregnancy, unspecified trimester: Secondary | ICD-10-CM

## 2018-06-17 HISTORY — DX: Other diseases of the blood and blood-forming organs and certain disorders involving the immune mechanism complicating pregnancy, unspecified trimester: D69.6

## 2018-06-17 HISTORY — DX: Thrombocytopenia, unspecified: O99.119

## 2018-06-17 LAB — OBSTETRIC PANEL, INCLUDING HIV
Antibody Screen: NEGATIVE
Basophils Absolute: 0 10*3/uL (ref 0.0–0.2)
Basos: 0 %
EOS (ABSOLUTE): 0.1 10*3/uL (ref 0.0–0.4)
Eos: 1 %
HIV Screen 4th Generation wRfx: NONREACTIVE
Hematocrit: 32.9 % — ABNORMAL LOW (ref 34.0–46.6)
Hemoglobin: 11.3 g/dL (ref 11.1–15.9)
Hepatitis B Surface Ag: NEGATIVE
Immature Grans (Abs): 0.2 10*3/uL — ABNORMAL HIGH (ref 0.0–0.1)
Immature Granulocytes: 3 %
Lymphocytes Absolute: 1.5 10*3/uL (ref 0.7–3.1)
Lymphs: 22 %
MCH: 31.6 pg (ref 26.6–33.0)
MCHC: 34.3 g/dL (ref 31.5–35.7)
MCV: 92 fL (ref 79–97)
Monocytes Absolute: 0.5 10*3/uL (ref 0.1–0.9)
Monocytes: 7 %
Neutrophils Absolute: 4.5 10*3/uL (ref 1.4–7.0)
Neutrophils: 67 %
Platelets: 73 10*3/uL — CL (ref 150–450)
RBC: 3.58 x10E6/uL — ABNORMAL LOW (ref 3.77–5.28)
RDW: 13.5 % (ref 11.7–15.4)
RPR Ser Ql: NONREACTIVE
Rh Factor: POSITIVE
Rubella Antibodies, IGG: 1.96 index (ref >0.99)
WBC: 6.7 10*3/uL (ref 3.4–10.8)

## 2018-06-17 LAB — GLUCOSE TOLERANCE, 2 HOURS W/ 1HR
Glucose, 1 hour: 83 mg/dL (ref 65–179)
Glucose, 2 hour: 73 mg/dL (ref 65–152)
Glucose, Fasting: 72 mg/dL (ref 65–91)

## 2018-06-19 ENCOUNTER — Encounter: Payer: Self-pay | Admitting: General Practice

## 2018-06-21 ENCOUNTER — Other Ambulatory Visit (HOSPITAL_COMMUNITY): Payer: Self-pay | Admitting: *Deleted

## 2018-06-21 ENCOUNTER — Ambulatory Visit (HOSPITAL_COMMUNITY)
Admission: RE | Admit: 2018-06-21 | Discharge: 2018-06-21 | Disposition: A | Discharge status: Home / Self Care | Payer: Medicaid Other | Source: Ambulatory Visit | Attending: Obstetrics and Gynecology | Admitting: Obstetrics and Gynecology

## 2018-06-21 ENCOUNTER — Other Ambulatory Visit: Payer: Self-pay

## 2018-06-21 DIAGNOSIS — Z3400 Encounter for supervision of normal first pregnancy, unspecified trimester: Secondary | ICD-10-CM | POA: Diagnosis present

## 2018-06-21 DIAGNOSIS — O0932 Supervision of pregnancy with insufficient antenatal care, second trimester: Secondary | ICD-10-CM | POA: Diagnosis not present

## 2018-06-21 DIAGNOSIS — Z3A26 26 weeks gestation of pregnancy: Secondary | ICD-10-CM | POA: Diagnosis not present

## 2018-06-21 DIAGNOSIS — O0930 Supervision of pregnancy with insufficient antenatal care, unspecified trimester: Secondary | ICD-10-CM | POA: Diagnosis not present

## 2018-06-21 DIAGNOSIS — Z363 Encounter for antenatal screening for malformations: Secondary | ICD-10-CM | POA: Diagnosis not present

## 2018-06-21 DIAGNOSIS — O0933 Supervision of pregnancy with insufficient antenatal care, third trimester: Secondary | ICD-10-CM

## 2018-06-22 ENCOUNTER — Telehealth: Payer: Self-pay | Admitting: Cardiology

## 2018-06-26 ENCOUNTER — Encounter: Payer: Self-pay | Admitting: General Practice

## 2018-07-11 ENCOUNTER — Other Ambulatory Visit: Payer: Self-pay

## 2018-07-11 ENCOUNTER — Other Ambulatory Visit: Payer: Self-pay | Admitting: Cardiology

## 2018-07-11 DIAGNOSIS — R000 Tachycardia, unspecified: Secondary | ICD-10-CM

## 2018-07-11 DIAGNOSIS — R55 Syncope and collapse: Secondary | ICD-10-CM

## 2018-07-11 DIAGNOSIS — R002 Palpitations: Secondary | ICD-10-CM

## 2018-07-13 ENCOUNTER — Other Ambulatory Visit: Payer: Self-pay

## 2018-07-13 ENCOUNTER — Encounter: Payer: Self-pay | Admitting: Obstetrics and Gynecology

## 2018-07-13 ENCOUNTER — Telehealth (INDEPENDENT_AMBULATORY_CARE_PROVIDER_SITE_OTHER): Payer: Medicaid Other | Admitting: Obstetrics and Gynecology

## 2018-07-13 VITALS — BP 104/91 | Wt 144.0 lb

## 2018-07-13 DIAGNOSIS — O99113 Other diseases of the blood and blood-forming organs and certain disorders involving the immune mechanism complicating pregnancy, third trimester: Secondary | ICD-10-CM

## 2018-07-13 DIAGNOSIS — O26893 Other specified pregnancy related conditions, third trimester: Secondary | ICD-10-CM

## 2018-07-13 DIAGNOSIS — O99119 Other diseases of the blood and blood-forming organs and certain disorders involving the immune mechanism complicating pregnancy, unspecified trimester: Secondary | ICD-10-CM

## 2018-07-13 DIAGNOSIS — D696 Thrombocytopenia, unspecified: Secondary | ICD-10-CM

## 2018-07-13 DIAGNOSIS — G2581 Restless legs syndrome: Secondary | ICD-10-CM

## 2018-07-13 DIAGNOSIS — Z3400 Encounter for supervision of normal first pregnancy, unspecified trimester: Secondary | ICD-10-CM

## 2018-07-13 DIAGNOSIS — Z3A30 30 weeks gestation of pregnancy: Secondary | ICD-10-CM

## 2018-07-13 MED ORDER — MAGNESIUM 200 MG PO TABS: 400.0000 mg | ORAL_TABLET | Freq: Every day | ORAL | 2 refills | Status: DC

## 2018-07-13 NOTE — Progress Notes (Signed)
MY CHART VIDEO VIRTUAL OBSTETRICS VISIT ENCOUNTER NOTE  I connected with Melinda [name] on 07/13/18 at  8:30 AM EDT by My Chart video at home and verified that I am speaking with the correct person using two identifiers.   I discussed the limitations, risks, security and privacy concerns of performing an evaluation and management service by My Chart video and the availability of in person appointments. I also discussed with the patient that there may be a patient responsible charge related to this service. The patient expressed understanding and agreed to proceed.  Subjective:  Melinda LemmingsWhitney Ladona Ridgelaylor is a 33 y.o. G1P0 at 5276w0d being followed for ongoing prenatal care.  She is currently monitored for the following issues for this low-risk pregnancy and has CHICKENPOX, HX OF; Weight loss, non-intentional; Early satiety; Night sweat; Thrombocytopenia (HCC); Anemia; Iron deficiency anemia; Multinodular goiter; Herpes simplex; Supervision of normal first pregnancy, antepartum; and Thrombocytopenia affecting pregnancy (HCC) on their problem list.  Patient reports leg cramps and restless last night "that kept me up last night". Reports fetal movement. Denies any contractions, bleeding or leaking of fluid.   The following portions of the patient's history were reviewed and updated as appropriate: allergies, current medications, past family history, past medical history, past social history, past surgical history and problem list.   Objective:   General:  Alert, oriented and cooperative.   Mental Status: Normal mood and affect perceived. Normal judgment and thought content.  Rest of physical exam deferred due to type of encounter BP 133/89 (Cuff Size: Normal)   Wt 144 lb (65.3 kg)   LMP 12/25/2017   BMI 26.34 kg/m   Repeat was 104/91  >> VS taken by patient's at-home BP cuff   Assessment and Plan:  Pregnancy: G1P0 at 4176w0d 1. Thrombocytopenia affecting pregnancy (HCC) - Discussed low platelets from  last visit (73K) and possibly not being able to get epidural  - Plan to discuss with anesthesiologist when admitted for labor   2. Supervision of normal first pregnancy, antepartum - Reviewed prenatal & 3rd trimester labs and U/S results - Rx Magnesium 400 mg hs for RLS - Patient reports that cardiologist stated that her syncopal and heart palpitations are from dehydration - Discussed helpful ways to stay well-hydrated - Patient has a doula >> Edwena BlowKawanna Willis - Discussed that hopefully doulas will be allowed in the hospital as a part of the OB team [facility]  If not, she can still utilize Facetime to have doula support in the room - Patient expresses concern for developing PPD -- states she "would be one to not say anything and "suffer in silence." She wants to know what she can do to prevent it? - Discussed that PPD is hard to predict and prevent. Discussed some lifestyle modifications (positive self-talk & communication with support system and providers). Offered to have patient meet with CSW while PP in hospital and [facility]. Pt agrees with plan for PP. - Advised to retake BP tomorrow and report [facility] message [facility] - MC video OB in 2 wks   Preterm labor symptoms and general obstetric precautions including but not limited to vaginal bleeding, contractions, leaking of fluid and fetal movement were reviewed in detail with the patient.  I discussed the assessment and treatment plan with the patient. The patient was provided an opportunity to ask questions and all were answered. The patient agreed with the plan and demonstrated an understanding of the instructions. The patient was advised  to call back or seek an in-person office evaluation/go to MAU at [facility] for any urgent or concerning symptoms. Please refer to After Visit Summary for other counseling recommendations.   I provided 10 minutes of non-face-to-face time during this encounter. There  was 5 minutes of chart review time spent prior to this encounter. Total time spent = 15 minutes.  Return in about 2 weeks (around 07/27/2018) for Return OB - My Chart video.  Future Appointments  Date Time Provider [place]  07/19/2018  8:15 AM WH-MFC [place] 4 WH-MFCUS MFC-US  07/21/2018  9:00 AM Sueanne Margarita, MD [facility]  07/28/2018 10:50 AM Ainsley Spinner, Dara Lords, CNM CWH-REN None  08/24/2018  9:30 AM Laury Deep, CNM C[facility]-REN None  09/06/2018  9:10 AM Laury Deep, CNM CWH-REN None  11/06/2018  9:00 AM OCO-CERVICAL SCREENING CHCC-OCO None    Laury Deep, [place] for [employer] [place] [facility]

## 2018-07-13 NOTE — Patient Instructions (Signed)

## 2018-07-19 ENCOUNTER — Other Ambulatory Visit: Payer: Self-pay

## 2018-07-19 ENCOUNTER — Ambulatory Visit (HOSPITAL_COMMUNITY)
Admission: RE | Admit: 2018-07-19 | Discharge: 2018-07-19 | Disposition: A | Discharge status: Home / Self Care | Payer: Medicaid Other | Source: Ambulatory Visit | Attending: Maternal & Fetal Medicine | Admitting: Maternal & Fetal Medicine

## 2018-07-19 DIAGNOSIS — O0933 Supervision of pregnancy with insufficient antenatal care, third trimester: Secondary | ICD-10-CM | POA: Diagnosis not present

## 2018-07-19 DIAGNOSIS — Z362 Encounter for other antenatal screening follow-up: Secondary | ICD-10-CM

## 2018-07-19 DIAGNOSIS — Z3A30 30 weeks gestation of pregnancy: Secondary | ICD-10-CM

## 2018-07-20 ENCOUNTER — Telehealth: Payer: Self-pay | Admitting: Cardiology

## 2018-07-20 NOTE — Progress Notes (Signed)
Virtual Visit via Video Note   This visit type was conducted due to national recommendations for restrictions regarding the COVID-19 Pandemic (e.g. social distancing) in an effort to limit this patient's exposure and mitigate transmission in our community.  Due to her co-morbid illnesses, this patient is at least at moderate risk for complications without adequate follow up.  This format is felt to be most appropriate for this patient at this time.  All issues noted in this document were discussed and addressed.  A limited physical exam was performed with this format.  Please refer to the patient's chart for her consent to telehealth for [facility].   Evaluation Performed:  Cardiology Consult  This visit type was conducted due to national recommendations for restrictions regarding the COVID-19 Pandemic (e.g. social distancing).  This format is felt to be most appropriate for this patient at this time.  All issues noted in this document were discussed and addressed.  No physical exam was performed (except for noted visual exam findings with Video Visits).  Please refer to the patient's chart (MyChart message for video visits and phone note for telephone visits) for the patient's consent to telehealth for [facility].  Date:  07/21/2018   ID:  Melinda [name], DOB [birth year], MRN [id number]  Patient Location:  Home  Provider location:   [place]  PCP:  Melinda Morale, MD  Cardiologist:  New Electrophysiologist:  None   Chief Complaint:  Syncope  History of Present Illness:    Melinda [name] is a 33 y.o. female who presents via audio/video conferencing for a telehealth visit today in referral by for evaluation of syncope.   This is a 33yo female who is [redacted] weeks pregnant.  She was seen in the ER on 4/29 after a syncopal episode.  She has had 3 syncopal episodes over the past 7-8 months.  The first occurred while at work she was rushing around noticed her heart beating fast and then  became diaphoretic and dizzy and passed out.  She did not know she was pregnant at the time.  The second time occurred while she was rushing around and stressed and anxious with the same symptoms occurring.  The last time she was in the doctors office and says she got very nervous because there were a lot of people around and the same sx occurred again.  She denies any chest pain, PND, orthopnea.  She has had some mild SOB recently with her uterus enlarging and some mild LE edema.   She wore an event monitor which showed sinus tachycardia with average HR 110bpm   The patient does not have symptoms concerning for COVID-19 infection (fever, chills, cough, or new shortness of breath).    Prior CV studies:   The following studies were reviewed today:  Event monitor  Past Medical History:  Diagnosis Date  . Anemia   . Chickenpox   . Early satiety   . Herpes genitalia   . HPV (human papilloma virus) anogenital infection   . Iron deficiency anemia   . Night sweat   . Scarring, keloid    external genitalia  . Thrombocytopenia ([place])   . Vaginal Pap smear, abnormal   . Weight loss, non-intentional    Past Surgical History:  Procedure Laterality Date  . WISDOM TOOTH EXTRACTION       Current Meds  Medication Sig  . Elastic Bandages & Supports (COMFORT FIT MATERNITY SUPP MED) MISC 1 Device by Does not apply route daily.  Marland Kitchen  Magnesium 200 MG TABS Take 2 tablets (400 mg total) by mouth at bedtime.  . Prenat-Fe Carbonyl-FA-Omega 3 (ONE-A-DAY WOMENS PRENATAL 1) 28-0.8-235 MG CAPS Take 1 capsule by mouth daily.     Allergies:   Patient has no known allergies.   Social History   Tobacco Use  . Smoking status: Never Smoker  . Smokeless tobacco: Never Used  Substance Use Topics  . Alcohol use: No    Alcohol/week: 0.0 standard drinks  . Drug use: No     Family Hx: The patient's family history includes Diabetes in her maternal grandmother; Hypertension in her maternal grandmother, maternal  uncle, and mother; Other in her father.  ROS:   Please see the history of present illness.     All other systems reviewed and are negative.   Labs/Other Tests and Data Reviewed:    Recent Labs: 05/10/2018: ALT 14; BUN 11; Creatinine, Ser 0.59; Potassium 3.6; Sodium 137 06/16/2018: Hemoglobin 11.3; Platelets 73   Recent Lipid Panel No results found for: CHOL, TRIG, HDL, CHOLHDL, LDLCALC, LDLDIRECT  Wt Readings from Last 3 Encounters:  07/21/18 145 lb (65.8 kg)  07/13/18 144 lb (65.3 kg)  06/16/18 136 lb (61.7 kg)     Objective:    Vital Signs:  BP 111/71   Pulse (!) 108   Ht 5\' 2"  (1.575 m)   Wt 145 lb (65.8 kg)   LMP 12/25/2017   BMI 26.52 kg/m    CONSTITUTIONAL:  Well nourished, well developed female in no acute distress.  EYES: anicteric MOUTH: oral mucosa is pink RESPIRATORY: Normal respiratory effort, symmetric expansion CARDIOVASCULAR: No peripheral edema SKIN: No rash, lesions or ulcers MUSCULOSKELETAL: no digital cyanosis NEURO: Cranial Nerves II-XII grossly intact, moves all extremities PSYCH: Intact judgement and insight.  A&O x 3, Mood/affect appropriate   ASSESSMENT & PLAN:    1.  Syncope -this is likely vasovagal in etiology although each time was associated with palpitations -Each episode occurred when she was anxious or stressed out with similar sx of diaphoresis, dizziness and palpitations.  -event monitor showed no arrhythmias -She has no other cardiac complaints and has never had syncope prior to this -recommend 2D echo to assess for structural heart disease -encouraged to get adequate hydration  2.  Sinus tachycardia -noted on event monitor -average HR 110bpm which is normal for her being pregnant -again encouraged adequate hydration -will get echo to make sure LVF is normal and check TSH -at this time would hold off on BB since her HR is only mildly elevated which is normal and Bb can cause intrauterine growth retardation  COVID-19  Education: The signs and symptoms of COVID-19 were discussed with the patient and how to seek care for testing (follow up with PCP or arrange E-visit).  The importance of social distancing was discussed today.  Patient Risk:   After full review of this patient's clinical status, I feel that they are at least moderate risk at this time.  Time:   Today, I have spent 20 minutes directly on telemedicine video discussing medical problems including syncope and tachycardi.  We also reviewed the symptoms of COVID 19 and the ways to protect against contracting the virus with telehealth technology.  I spent an additional 5 minutes reviewing patient's chart including hospital notes and event monitor.  Medication Adjustments/Labs and Tests Ordered: Current medicines are reviewed at length with the patient today.  Concerns regarding medicines are outlined above.  Tests Ordered: No orders of the defined types were placed  in this encounter.  Medication Changes: No orders of the defined types were placed in this encounter.   Disposition:  Follow up in 4 week(s)  Signed, Armanda Magicraci Turner, MD  07/21/2018 9:16 AM    [facility]

## 2018-07-20 NOTE — Telephone Encounter (Signed)
New Message ° ° ° °Left message to confirm appt and get consent  °

## 2018-07-21 ENCOUNTER — Encounter: Payer: Self-pay | Admitting: Cardiology

## 2018-07-21 ENCOUNTER — Other Ambulatory Visit: Payer: Self-pay

## 2018-07-21 ENCOUNTER — Telehealth (INDEPENDENT_AMBULATORY_CARE_PROVIDER_SITE_OTHER): Payer: Medicaid Other | Admitting: Cardiology

## 2018-07-21 DIAGNOSIS — R000 Tachycardia, unspecified: Secondary | ICD-10-CM

## 2018-07-21 DIAGNOSIS — R55 Syncope and collapse: Secondary | ICD-10-CM

## 2018-07-27 ENCOUNTER — Other Ambulatory Visit: Payer: Medicaid Other | Admitting: *Deleted

## 2018-07-27 ENCOUNTER — Other Ambulatory Visit: Payer: Self-pay

## 2018-07-27 ENCOUNTER — Ambulatory Visit (HOSPITAL_COMMUNITY): Payer: Medicaid Other | Attending: Internal Medicine

## 2018-07-27 DIAGNOSIS — R000 Tachycardia, unspecified: Secondary | ICD-10-CM | POA: Diagnosis present

## 2018-07-27 DIAGNOSIS — R55 Syncope and collapse: Secondary | ICD-10-CM | POA: Diagnosis present

## 2018-07-27 LAB — TSH: TSH: 1.87 u[IU]/mL (ref 0.450–4.500)

## 2018-07-28 ENCOUNTER — Telehealth (INDEPENDENT_AMBULATORY_CARE_PROVIDER_SITE_OTHER): Payer: Medicaid Other | Admitting: Family

## 2018-07-28 VITALS — BP 96/70 | HR 119 | Wt 146.8 lb

## 2018-07-28 DIAGNOSIS — Z3A32 32 weeks gestation of pregnancy: Secondary | ICD-10-CM

## 2018-07-28 DIAGNOSIS — O99343 Other mental disorders complicating pregnancy, third trimester: Secondary | ICD-10-CM

## 2018-07-28 DIAGNOSIS — D696 Thrombocytopenia, unspecified: Secondary | ICD-10-CM

## 2018-07-28 DIAGNOSIS — F419 Anxiety disorder, unspecified: Secondary | ICD-10-CM

## 2018-07-28 DIAGNOSIS — O99119 Other diseases of the blood and blood-forming organs and certain disorders involving the immune mechanism complicating pregnancy, unspecified trimester: Secondary | ICD-10-CM

## 2018-07-28 DIAGNOSIS — Z3400 Encounter for supervision of normal first pregnancy, unspecified trimester: Secondary | ICD-10-CM

## 2018-07-28 NOTE — Progress Notes (Signed)
   PRENATAL VISIT NOTE  Subjective:  Melinda [name] is a 33 y.o. G1P0 at [redacted]w[redacted]d being seen today for ongoing prenatal care.  She is currently monitored for the following issues for this high-risk pregnancy and has CHICKENPOX, HX OF; Weight loss, non-intentional; Early satiety; Night sweat; Thrombocytopenia ([place]); Anemia; Iron deficiency anemia; Multinodular goiter; Herpes simplex; Supervision of normal first pregnancy, antepartum; and Thrombocytopenia affecting pregnancy ([place]) on their problem list.  Patient reports overall doing well.  Good fetal movement.  Main concern related to anxiety surrounding labor.  Unlikely to have epidural if platelets continue to remain low.  +doula.  Also anxiety/fear surrounding postpartum period..  Contractions: Not present. Vag. Bleeding: None.  Movement: Present. Denies leaking of fluid.   The following portions of the patient's history were reviewed and updated as appropriate: allergies, current medications, past family history, past medical history, past social history, past surgical history and problem list.   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  Recommended continued observation if remains above 50.  Pt seen by cardiologist due to syncope.  Reported feeling it was stress related.    Objective:   Vitals:   07/28/18 1055  BP: 96/70  Pulse: (!) 119  Weight: 146 lb 12.8 oz (66.6 kg)    Fetal Status:     Movement: Present     General:  Alert, oriented and cooperative. Patient is in no acute distress.  Respiratory: Normal respiratory effort, no problems with respiration noted  Extremities: Normal range of motion.  Edema: None  Mental Status: Normal mood and affect. Normal behavior. Normal judgment and thought content.   Assessment and Plan:  Pregnancy: G1P0 at [redacted]w[redacted]d 1. Supervision of  first pregnancy, antepartum - Discussed benefits of doula in labor - Reviewed labor and strategies for coping  2. Thrombocytopenia affecting pregnancy (HCC) - CBC;  Future - Continue observation  3. Anxiety - Shared information regarding prenatal yoga - Referred to [facility] Roselyn Reef) for anxiety strategies   Preterm labor symptoms and general obstetric precautions including but not limited to vaginal bleeding, contractions, leaking of fluid and fetal movement were reviewed in detail with the patient. Please refer to After Visit Summary for other counseling recommendations.   No follow-ups on file.  Future Appointments  Date Time Provider [place]  08/01/2018 10:00 AM [facility]-REN None  08/02/2018 10:00 AM Halbur Fisk  08/24/2018  9:30 AM Laury Deep, CNM [facility]-REN None  08/30/2018  1:40 PM Sueanne Margarita, MD [facility]  09/06/2018  9:10 AM Laury Deep, CNM [facility]-REN None  11/06/2018  9:00 AM [facility]-CERVICAL SCREENING [facility] None    Cyndee Brightly, CNM

## 2018-08-01 ENCOUNTER — Other Ambulatory Visit: Payer: Self-pay | Admitting: *Deleted

## 2018-08-01 ENCOUNTER — Other Ambulatory Visit: Payer: Self-pay

## 2018-08-01 ENCOUNTER — Other Ambulatory Visit (INDEPENDENT_AMBULATORY_CARE_PROVIDER_SITE_OTHER): Payer: Medicaid Other | Admitting: *Deleted

## 2018-08-01 DIAGNOSIS — D696 Thrombocytopenia, unspecified: Secondary | ICD-10-CM

## 2018-08-01 DIAGNOSIS — O99119 Other diseases of the blood and blood-forming organs and certain disorders involving the immune mechanism complicating pregnancy, unspecified trimester: Secondary | ICD-10-CM

## 2018-08-01 NOTE — Progress Notes (Signed)
   Patient in clinic for CBC lab work.  Derl Barrow, RN

## 2018-08-02 ENCOUNTER — Institutional Professional Consult (permissible substitution): Payer: Medicaid Other

## 2018-08-02 ENCOUNTER — Telehealth (INDEPENDENT_AMBULATORY_CARE_PROVIDER_SITE_OTHER): Payer: Medicaid Other | Admitting: Clinical

## 2018-08-02 DIAGNOSIS — F4322 Adjustment disorder with anxiety: Secondary | ICD-10-CM | POA: Diagnosis not present

## 2018-08-02 LAB — CBC
Hematocrit: 31.7 % — ABNORMAL LOW (ref 34.0–46.6)
Hemoglobin: 10.5 g/dL — ABNORMAL LOW (ref 11.1–15.9)
MCH: 30.7 pg (ref 26.6–33.0)
MCHC: 33.1 g/dL (ref 31.5–35.7)
MCV: 93 fL (ref 79–97)
Platelets: 84 10*3/uL — CL (ref 150–450)
RBC: 3.42 x10E6/uL — ABNORMAL LOW (ref 3.77–5.28)
RDW: 13.1 % (ref 11.7–15.4)
WBC: 8.4 10*3/uL (ref 3.4–10.8)

## 2018-08-02 NOTE — BH Specialist Note (Signed)
Integrated Behavioral Health via Telemedicine Video Visit  08/02/2018 Melinda [name] Pascale [id number]  Number of Integrated Behavioral Health visits: 1 Session Start time: 10:03  Session End time: 10:55 Total time: 50 minutes  Referring Provider: Tommi EmeryWalidah Karim-Rhoades, CNM for anxiety Type of Visit: Video Patient/Family location: Home  [facility] Provider location: [facility] All persons participating in visit: Patient Melinda [name] Smigiel and [facility]    Confirmed patient's address: Yes  Confirmed patient's phone number: Yes  Any changes to demographics: No   Confirmed patient's insurance: Yes   Any changes to patient's insurance: No   Discussed confidentiality: Yes   I connected with Melinda [name] Deignan  by a video enabled telemedicine application and verified that I am speaking with the correct person using two identifiers.     I discussed the limitations of evaluation and management by telemedicine and the availability of in person appointments.  I discussed that the purpose of this visit is to provide behavioral health care while limiting exposure to the novel coronavirus.   Discussed there is a possibility of technology failure and discussed alternative modes of communication if that failure occurs.  I discussed that engaging in this video visit, they consent to the provision of behavioral healthcare and the services will be billed under their insurance.  Patient and/or legal guardian expressed understanding and consented to video visit: Yes   PRESENTING CONCERNS: Patient and/or family reports the following symptoms/concerns: Pt states her primary concern today is excessive worry and lack of quality sleep, attributed to worry over not knowing what to expect during an unmedicated childbirth, and feeling anxious over unknowns in postpartum time, along with worry about how low blood platelets and anemia will affect her during childbirth.  Duration of problem: Current pregnancy; Severity of  problem: moderate  STRENGTHS (Protective Factors/Coping Skills): Open to learning new skills  GOALS ADDRESSED: Patient will: 1.  Reduce symptoms of: anxiety and depression  2.  Increase knowledge and/or ability of: coping skills and healthy habits  3.  Demonstrate ability to: Increase healthy adjustment to current life circumstances and Increase adequate support systems for patient/family  INTERVENTIONS: Interventions utilized:  Mindfulness or Management consultantelaxation Training, Psychoeducation and/or Health Education and Link to [employer] Standardized Assessments completed: GAD-7 and PHQ 9  ASSESSMENT: Patient currently experiencing Adjustment disorder with anxiety.   Patient may benefit from psychoeducation and brief therapeutic interventions regarding coping with symptoms of anxiety .  PLAN: 1. Follow up with behavioral health clinician on : As needed, if symptoms continue, or increase 2. Behavioral recommendations:  -Attend first childbirth education class online tonight -CALM relaxation breathing exercise twice daily (morning; evening) -Prioritize increasing iron level by taking prenatal vitamin daily, and eating foods high in iron (paired with foods high in vitamin c), as discussed -Prioritize healthy sleep during remainder of pregnancy through postpartum time, as discussed -Prepare to attend new mom support group online after baby is born, on either [email or web address] or [email or web address].net, by looking up both groups prior to birth -Continue using birth doula for additional self-coping strategies for a medication-free birth 3. Referral(s): Integrated Art gallery managerBehavioral Health Services (In Clinic) and [employer] Resources:  New mom support   I discussed the assessment and treatment plan with the patient and/or parent/guardian. They were provided an opportunity to ask questions and all were answered. They agreed with the plan and demonstrated an understanding of the instructions.   They were  advised to call back or seek an in-person evaluation if the symptoms worsen or if the condition fails to improve as anticipated.  Caroleen Hamman

## 2018-08-10 ENCOUNTER — Telehealth: Payer: Self-pay | Admitting: General Practice

## 2018-08-10 NOTE — Telephone Encounter (Signed)
Pt aware of appt change on 09/06/2018 and verbalized understanding.

## 2018-08-21 ENCOUNTER — Telehealth: Payer: Self-pay | Admitting: *Deleted

## 2018-08-21 NOTE — Telephone Encounter (Signed)
Patient called stating she is having cramping started yesterday. Patient denies vaginal bleeding. Pt has also complaints of nausea and decrease appetite.  Advised patient to take Tylenol, drink plenty of water, take a warm bath and lay on left side for a hour. If the cramping is relieved with those measures to go to MAU. Also advised pt to time the cramping.  Derl Barrow, RN

## 2018-08-24 ENCOUNTER — Other Ambulatory Visit (HOSPITAL_COMMUNITY)
Admission: RE | Admit: 2018-08-24 | Discharge: 2018-08-24 | Disposition: A | Discharge status: Home / Self Care | Payer: Medicaid Other | Source: Ambulatory Visit | Attending: Obstetrics and Gynecology | Admitting: Obstetrics and Gynecology

## 2018-08-24 ENCOUNTER — Encounter: Payer: Self-pay | Admitting: Obstetrics and Gynecology

## 2018-08-24 ENCOUNTER — Ambulatory Visit (INDEPENDENT_AMBULATORY_CARE_PROVIDER_SITE_OTHER): Payer: Medicaid Other | Admitting: Obstetrics and Gynecology

## 2018-08-24 ENCOUNTER — Other Ambulatory Visit: Payer: Self-pay

## 2018-08-24 VITALS — BP 125/85 | HR 119 | Temp 97.6°F | Wt 148.0 lb

## 2018-08-24 DIAGNOSIS — Z3400 Encounter for supervision of normal first pregnancy, unspecified trimester: Secondary | ICD-10-CM | POA: Diagnosis not present

## 2018-08-24 DIAGNOSIS — O99119 Other diseases of the blood and blood-forming organs and certain disorders involving the immune mechanism complicating pregnancy, unspecified trimester: Secondary | ICD-10-CM

## 2018-08-24 DIAGNOSIS — B009 Herpesviral infection, unspecified: Secondary | ICD-10-CM

## 2018-08-24 DIAGNOSIS — N898 Other specified noninflammatory disorders of vagina: Secondary | ICD-10-CM

## 2018-08-24 DIAGNOSIS — O99113 Other diseases of the blood and blood-forming organs and certain disorders involving the immune mechanism complicating pregnancy, third trimester: Secondary | ICD-10-CM

## 2018-08-24 DIAGNOSIS — Z3A36 36 weeks gestation of pregnancy: Secondary | ICD-10-CM

## 2018-08-24 DIAGNOSIS — O98513 Other viral diseases complicating pregnancy, third trimester: Secondary | ICD-10-CM

## 2018-08-24 DIAGNOSIS — D696 Thrombocytopenia, unspecified: Secondary | ICD-10-CM

## 2018-08-24 DIAGNOSIS — O26843 Uterine size-date discrepancy, third trimester: Secondary | ICD-10-CM | POA: Insufficient documentation

## 2018-08-24 MED ORDER — VALACYCLOVIR HCL 500 MG PO TABS: 500.0000 mg | ORAL_TABLET | Freq: Two times a day (BID) | ORAL | 1 refills | Status: DC

## 2018-08-24 MED ORDER — TERCONAZOLE 0.4 % VA CREA: 1.0000 | TOPICAL_CREAM | Freq: Every day | VAGINAL | 0 refills | Status: AC

## 2018-08-24 NOTE — Patient Instructions (Signed)
For doula transition program -- Please contact Heather Hogan: [email or web address]

## 2018-08-24 NOTE — Progress Notes (Addendum)
PRENATAL VISIT NOTE  Subjective:  Melinda [name] is a 33 y.o. G1P0 at 2949w0d being seen today for ongoing prenatal care.  She is currently monitored for the following issues for this low-risk pregnancy and has CHICKENPOX, HX OF; Weight loss, non-intentional; Early satiety; Night sweat; Thrombocytopenia (HCC); Anemia; Iron deficiency anemia; Multinodular goiter; Herpes simplex; Supervision of normal first pregnancy, antepartum; Thrombocytopenia affecting pregnancy (HCC); and Uterine size date discrepancy pregnancy, third trimester on their problem list.  Patient reports vaginal irritation with thick, white d/c. Still concerned about platelets being too low to get an epidural. Plans to use doula - Edwena BlowKawanna Willis. She is unsure if her doula has completed the doula program application process. She will talk to her after this appointment. Contractions: Irregular. Vag. Bleeding: None.  Movement: Present. Denies leaking of fluid.   The following portions of the patient's history were reviewed and updated as appropriate: allergies, current medications, past family history, past medical history, past social history, past surgical history and problem list.   Objective:   Vitals:   08/24/18 0947  BP: 125/85  Pulse: (!) 119  Temp: 97.6 F (36.4 C)  Weight: 148 lb (67.1 kg)    Fetal Status: Fetal Heart Rate (bpm): 152 Fundal Height: 40 cm Movement: Present  Presentation: Vertex  General:  Alert, oriented and cooperative. Patient is in no acute distress.  Skin: Skin is warm and dry. No rash noted.   Cardiovascular: Normal heart rate noted  Respiratory: Normal respiratory effort, no problems with respiration noted  Abdomen: Soft, gravid, appropriate for gestational age.  Pain/Pressure: Present     Pelvic: Cervical exam performed Dilation: Closed Effacement (%): Thick Station: -3  Extremities: Normal range of motion.  Edema: None  Mental Status: Normal mood and affect. Normal behavior. Normal  judgment and thought content.   Assessment and Plan:  Pregnancy: G1P0 at 8849w0d 1. Supervision of normal first pregnancy, antepartum - Anticipatory guidance for weekly visits - [facility] - Culture, beta strep (group b only) - Cervicovaginal ancillary only( [place]) - Advised to cancel pap with [facility] d/t now having Medicaid  [facility]  2. Herpes virus infection in mother during third trimester of pregnancy - Start prophylactic suppression - valACYclovir (VALTREX) 500 MG tablet; Take 1 tablet (500 mg total) by mouth 2 (two) times daily.  Dispense: 60 tablet; Refill: 1  3. Uterine size date discrepancy pregnancy, third trimester - Needs growth U/S ASAP - US MFM OB FOLLOW UP; Future  4. Vaginal irritation - terconazole (TERAZOL 7) 0.4 % vaginal cream; Place 1 applicator vaginally at bedtime for 7 days.  Dispense: 45 g; Refill: 0  5. Thrombocytopenia affecting pregnancy (HCC) - Will need to discuss with anesthesiologist when admitted for consideration of epidural - Ambulatory referral to Hematology  Preterm labor symptoms and general obstetric precautions including but not limited to vaginal bleeding, contractions, leaking of fluid and fetal movement were reviewed in detail with the patient. Please refer to After Visit Summary for other counseling recommendations.   Return in about 1 week (around 08/31/2018) for Return OB - My Chart video.  Future Appointments  Date Time Provider Department Center  08/30/2018  1:40 PM Quintella Reicherturner, Traci R, MD [facility]  08/30/2018  3:45 PM [facility] US 2 [facility]  08/31/2018  9:50 AM Raelyn Moraawson, Abrey Bradway, CNM [facility] None  09/06/2018  1:10 PM Raelyn Moraawson, Davine Coba, CNM [facility] None  11/06/2018  9:00 AM [facility]-CERVICAL SCREEN[facility]  [facility] None    Laury Deep, CNM

## 2018-08-28 LAB — CULTURE, BETA STREP (GROUP B ONLY): Strep Gp B Culture: NEGATIVE

## 2018-08-29 LAB — CERVICOVAGINAL ANCILLARY ONLY
Bacterial vaginitis: NEGATIVE
Candida vaginitis: POSITIVE — AB
Chlamydia: NEGATIVE
Neisseria Gonorrhea: NEGATIVE
Trichomonas: NEGATIVE

## 2018-08-30 ENCOUNTER — Other Ambulatory Visit: Payer: Self-pay

## 2018-08-30 ENCOUNTER — Ambulatory Visit: Payer: Medicaid Other | Admitting: Cardiology

## 2018-08-30 ENCOUNTER — Ambulatory Visit (HOSPITAL_COMMUNITY)
Admission: RE | Admit: 2018-08-30 | Discharge: 2018-08-30 | Disposition: A | Discharge status: Home / Self Care | Payer: Medicaid Other | Source: Ambulatory Visit | Attending: Obstetrics and Gynecology | Admitting: Obstetrics and Gynecology

## 2018-08-30 DIAGNOSIS — Z3A36 36 weeks gestation of pregnancy: Secondary | ICD-10-CM | POA: Diagnosis not present

## 2018-08-30 DIAGNOSIS — O0933 Supervision of pregnancy with insufficient antenatal care, third trimester: Secondary | ICD-10-CM

## 2018-08-30 DIAGNOSIS — Z362 Encounter for other antenatal screening follow-up: Secondary | ICD-10-CM

## 2018-08-30 DIAGNOSIS — O26843 Uterine size-date discrepancy, third trimester: Secondary | ICD-10-CM | POA: Diagnosis present

## 2018-08-31 ENCOUNTER — Other Ambulatory Visit: Payer: Self-pay | Admitting: Hematology and Oncology

## 2018-08-31 ENCOUNTER — Encounter: Payer: Self-pay | Admitting: Obstetrics and Gynecology

## 2018-08-31 ENCOUNTER — Telehealth (INDEPENDENT_AMBULATORY_CARE_PROVIDER_SITE_OTHER): Payer: Medicaid Other | Admitting: Obstetrics and Gynecology

## 2018-08-31 VITALS — BP 95/75 | HR 122 | Wt 148.0 lb

## 2018-08-31 DIAGNOSIS — Z3A37 37 weeks gestation of pregnancy: Secondary | ICD-10-CM

## 2018-08-31 DIAGNOSIS — D696 Thrombocytopenia, unspecified: Secondary | ICD-10-CM

## 2018-08-31 DIAGNOSIS — Z3400 Encounter for supervision of normal first pregnancy, unspecified trimester: Secondary | ICD-10-CM

## 2018-08-31 DIAGNOSIS — D509 Iron deficiency anemia, unspecified: Secondary | ICD-10-CM

## 2018-08-31 DIAGNOSIS — B009 Herpesviral infection, unspecified: Secondary | ICD-10-CM | POA: Insufficient documentation

## 2018-08-31 DIAGNOSIS — O26843 Uterine size-date discrepancy, third trimester: Secondary | ICD-10-CM

## 2018-08-31 DIAGNOSIS — O99119 Other diseases of the blood and blood-forming organs and certain disorders involving the immune mechanism complicating pregnancy, unspecified trimester: Secondary | ICD-10-CM

## 2018-08-31 DIAGNOSIS — O98513 Other viral diseases complicating pregnancy, third trimester: Secondary | ICD-10-CM | POA: Insufficient documentation

## 2018-08-31 DIAGNOSIS — O99113 Other diseases of the blood and blood-forming organs and certain disorders involving the immune mechanism complicating pregnancy, third trimester: Secondary | ICD-10-CM

## 2018-08-31 HISTORY — DX: Herpesviral infection, unspecified: B00.9

## 2018-08-31 NOTE — Progress Notes (Signed)
MY CHART VIDEO VIRTUAL OBSTETRICS VISIT ENCOUNTER NOTE  I connected with Melinda [name] on 08/31/18 at  9:50 AM EDT by My Chart video at home and verified that I am speaking with the correct person using two identifiers.   I discussed the limitations, risks, security and privacy concerns of performing an evaluation and management service by My Chart video and the availability of in person appointments. I also discussed with the patient that there may be a patient responsible charge related to this service. The patient expressed understanding and agreed to proceed.  Subjective:  Melinda [name] is a 33 y.o. G1P0 at 6044w0d being followed for ongoing prenatal care.  She is currently monitored for the following issues for this low-risk pregnancy and has CHICKENPOX, HX OF; Weight loss, non-intentional; Early satiety; Night sweat; Thrombocytopenia (HCC); Anemia; Iron deficiency anemia; Multinodular goiter; Herpes simplex; Supervision of normal first pregnancy, antepartum; Thrombocytopenia affecting pregnancy (HCC); and Uterine size date discrepancy pregnancy, third trimester on their problem list.  Patient reports increased anxiety since finding out baby weighs 8 lbs on U/S yesterday. She is concerned that with her small body frame, if she will be able to have the baby vaginally. Reports fetal movement. Denies any contractions, bleeding or leaking of fluid.   The following portions of the patient's history were reviewed and updated as appropriate: allergies, current medications, past family history, past medical history, past social history, past surgical history and problem list.   Objective:   General:  Alert, oriented and cooperative.   Mental Status: Normal mood and affect perceived. Normal judgment and thought content.  Rest of physical exam deferred due to type of encounter BP 95/81  Pulse 125 -- repeat>>BP 95/75   Pulse (!) 122   Wt 148 lb (67.1 kg)   LMP 12/25/2017   BMI 27.07 kg/m   **Done by patient's own at home BP cuff and scale  Assessment and Plan:  Pregnancy: G1P0 at 4244w0d  1. Uterine size date discrepancy pregnancy, third trimester - Unable to review U/S report prior to this visit (not in Epic) - Reassurance given that our bodies will grow the baby that it can deliver, but we will take all precautions to ensure she and baby are safe and healthy during AP & IP  2. Thrombocytopenia affecting pregnancy (HCC) - Managed by hematology (next appt on 09/05/18)  3. Supervision of normal first pregnancy, antepartum - Doula is last stages of application process and should be able to be allowed in hospital by the time she delivers - Discussed that if doula is cleared to be in hospital before her delivery, she will be considered one of the healthcare team members and will not count as her support person. Therefore, she will be able to have her doula and her mother for support.  4. Herpes virus in pregnancy, third trimester - Taking Valtrex 500 mg BID  Term labor symptoms and general obstetric precautions including but not limited to vaginal bleeding, contractions, leaking of fluid and fetal movement were reviewed in detail with the patient.  I discussed the assessment and treatment plan with the patient. The patient was provided an opportunity to ask questions and all were answered. The patient agreed with the plan and demonstrated an understanding of the instructions. The patient was advised to call back or seek an in-person office evaluation/[facility] for any urgent or concerning symptoms. Please refer to After Visit Summary for other counseling recommendations.   I provided  10 minutes of non-face-to-face time during this encounter. There was 5 minutes of chart review time spent prior to this encounter. Total time spent = 15 minutes.  Return in about 1 week (around 09/07/2018) for Return OB - My Chart video.  Future Appointments  Date Time  Provider [place]  09/05/2018 10:15 AM [facility] LAB 1 [facility] None  09/05/2018 10:30 AM Heath Lark, MD [facility] None  09/06/2018  1:10 PM Laury Deep, CNM [facility] None  10/13/2018  2:00 PM Sueanne Margarita, MD [facility]  11/06/2018  9:00 AM [facility]-CERVICAL SCREEN[facility] None    Laury Deep, [place] for [employer], [place] [facility]

## 2018-09-05 ENCOUNTER — Encounter: Payer: Self-pay | Admitting: Hematology and Oncology

## 2018-09-05 ENCOUNTER — Inpatient Hospital Stay (HOSPITAL_BASED_OUTPATIENT_CLINIC_OR_DEPARTMENT_OTHER): Payer: Medicaid Other | Admitting: Hematology and Oncology

## 2018-09-05 ENCOUNTER — Telehealth: Payer: Self-pay | Admitting: Hematology and Oncology

## 2018-09-05 ENCOUNTER — Other Ambulatory Visit: Payer: Self-pay

## 2018-09-05 ENCOUNTER — Inpatient Hospital Stay: Payer: Medicaid Other | Attending: Hematology and Oncology

## 2018-09-05 DIAGNOSIS — D696 Thrombocytopenia, unspecified: Secondary | ICD-10-CM

## 2018-09-05 DIAGNOSIS — D500 Iron deficiency anemia secondary to blood loss (chronic): Secondary | ICD-10-CM | POA: Diagnosis not present

## 2018-09-05 DIAGNOSIS — Z79899 Other long term (current) drug therapy: Secondary | ICD-10-CM | POA: Insufficient documentation

## 2018-09-05 DIAGNOSIS — D509 Iron deficiency anemia, unspecified: Secondary | ICD-10-CM

## 2018-09-05 DIAGNOSIS — D693 Immune thrombocytopenic purpura: Secondary | ICD-10-CM | POA: Diagnosis not present

## 2018-09-05 DIAGNOSIS — N920 Excessive and frequent menstruation with regular cycle: Secondary | ICD-10-CM | POA: Insufficient documentation

## 2018-09-05 DIAGNOSIS — O99119 Other diseases of the blood and blood-forming organs and certain disorders involving the immune mechanism complicating pregnancy, unspecified trimester: Secondary | ICD-10-CM

## 2018-09-05 LAB — CMP (CANCER CENTER ONLY)
ALT: 13 U/L (ref 0–44)
AST: 16 U/L (ref 15–41)
Albumin: 2.7 g/dL — ABNORMAL LOW (ref 3.5–5.0)
Alkaline Phosphatase: 132 U/L — ABNORMAL HIGH (ref 38–126)
Anion gap: 9 (ref 5–15)
BUN: 7 mg/dL (ref 6–20)
CO2: 19 mmol/L — ABNORMAL LOW (ref 22–32)
Calcium: 8.4 mg/dL — ABNORMAL LOW (ref 8.9–10.3)
Chloride: 108 mmol/L (ref 98–111)
Creatinine: 0.68 mg/dL (ref 0.44–1.00)
GFR, Est AFR Am: >60 mL/min (ref >60)
GFR, Estimated: >60 mL/min (ref >60)
Glucose, Bld: 90 mg/dL (ref 70–99)
Potassium: 3.7 mmol/L (ref 3.5–5.1)
Sodium: 136 mmol/L (ref 135–145)
Total Bilirubin: 0.5 mg/dL (ref 0.3–1.2)
Total Protein: 5.9 g/dL — ABNORMAL LOW (ref 6.5–8.1)

## 2018-09-05 LAB — CBC WITH DIFFERENTIAL/PLATELET
Abs Immature Granulocytes: 0.32 10*3/uL — ABNORMAL HIGH (ref 0.00–0.07)
Basophils Absolute: 0 10*3/uL (ref 0.0–0.1)
Basophils Relative: 0 %
Eosinophils Absolute: 0.1 10*3/uL (ref 0.0–0.5)
Eosinophils Relative: 1 %
HCT: 30.4 % — ABNORMAL LOW (ref 36.0–46.0)
Hemoglobin: 9.7 g/dL — ABNORMAL LOW (ref 12.0–15.0)
Immature Granulocytes: 5 %
Lymphocytes Relative: 23 %
Lymphs Abs: 1.5 10*3/uL (ref 0.7–4.0)
MCH: 28.8 pg (ref 26.0–34.0)
MCHC: 31.9 g/dL (ref 30.0–36.0)
MCV: 90.2 fL (ref 80.0–100.0)
Monocytes Absolute: 0.5 10*3/uL (ref 0.1–1.0)
Monocytes Relative: 9 %
Neutro Abs: 3.9 10*3/uL (ref 1.7–7.7)
Neutrophils Relative %: 62 %
Platelets: 103 10*3/uL — ABNORMAL LOW (ref 150–400)
RBC: 3.37 MIL/uL — ABNORMAL LOW (ref 3.87–5.11)
RDW: 14.6 % (ref 11.5–15.5)
WBC: 6.4 10*3/uL (ref 4.0–10.5)
nRBC: 0 % (ref 0.0–0.2)

## 2018-09-05 MED ORDER — PREDNISONE 20 MG PO TABS: 60.0000 mg | ORAL_TABLET | Freq: Every day | ORAL | 0 refills | Status: DC

## 2018-09-05 NOTE — Progress Notes (Signed)
[facility] CONSULT NOTE  Patient Care Team: Nelwyn Salisbury, MD as PCP - General Artis Delay, MD as Consulting Physician (Hematology and Oncology)  CHIEF COMPLAINTS/PURPOSE OF CONSULTATION:  Persistent thrombocytopenia, complicating the trimester pregnancy  HISTORY OF PRESENTING ILLNESS:  Melinda [name] 33 y.o. female is here because of thrombocytopenia.  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 The chronic thrombocytopenia exacerbated menorrhagia She had received intravenous iron infusion as well as blood transfusion several years ago She was lost to follow-up Most recently, she became pregnant She is currently approximately 37 weeks with expected due date of September 10 Her pregnancy is progressing well This is her first pregnancy and she is expecting a boy Currently, she is not working She denies recent bruising/bleeding, such as spontaneous epistaxis, hematuria, melena or hematochezia The patient denies history of liver disease, exposure to heparin, history of cardiac murmur/prior cardiovascular surgery or recent new medications Her energy level is fair   MEDICAL HISTORY:  Past Medical History:  Diagnosis Date  . Anemia   . Chickenpox   . Early satiety   . Herpes genitalia   . HPV (human papilloma virus) anogenital infection   . Iron deficiency anemia   . Night sweat   . Scarring, keloid    external genitalia  . Thrombocytopenia (HCC)   . Vaginal Pap smear, abnormal   . Weight loss, non-intentional     SURGICAL HISTORY: Past Surgical History:  Procedure Laterality Date  . WISDOM TOOTH EXTRACTION      SOCIAL HISTORY: Social History   Socioeconomic History  . Marital  status: Single    Spouse name: Not on file  . Number of children: 0  . Years of education: Not on file  . 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 Financial resource strain: Somewhat hard  . Food insecurity    Worry: Sometimes true    Inability: Sometimes true  . Transportation needs    Medical: No    Non-medical: No  Tobacco Use  . Smoking status: Never Smoker  . Smokeless tobacco: Never Used  Substance and Sexual Activity  . Alcohol use: No    Alcohol/week: 0.0 standard drinks  . Drug use: No  . Sexual activity: Not Currently    Birth control/protection: Pill    Comment: DESOGEN   (GEN)  Lifestyle  . Physical activity    Days per week: 0 days    Minutes per session: 0 min  . Stress: To some extent  Relationships  . Social Musician on phone: Once a week    Gets together: Once a week    Attends religious service: More than 4 times per year    Active member of club or organization: No    Attends meetings of clubs or organizations: Never    Relationship status: Never married  . Intimate partner violence    Fear of current or ex partner: No    Emotionally abused: No    Physically abused: No    Forced sexual activity: No  Other Topics Concern  . Not on file  Social History Narrative  . Not on file    FAMILY HISTORY: Family History  Problem Relation Age of Onset  . Hypertension Maternal Grandmother   . Diabetes Maternal Grandmother   . Hypertension Mother   . Other Father        Leukopenia.  . Hypertension Maternal Uncle     ALLERGIES:  has No Known Allergies.  MEDICATIONS:  Current Outpatient Medications  Medication Sig Dispense Refill  . Elastic Bandages & Supports (COMFORT FIT MATERNITY SUPP MED) MISC 1 Device by Does not apply route daily. 1 each 0  . Magnesium 200 MG TABS Take 2  tablets (400 mg total) by mouth at bedtime. 60 tablet 2  . predniSONE (DELTASONE) 20 MG tablet Take 3 tablets (60 mg total) by mouth daily with breakfast. 50 tablet 0  . Prenat-Fe Carbonyl-FA-Omega 3 (ONE-A-DAY WOMENS PRENATAL 1) 28-0.8-235 MG CAPS Take 1 capsule by mouth daily. 30 capsule 0  . valACYclovir (VALTREX) 500 MG tablet Take 1 tablet (500 mg total) by mouth 2 (two) times daily. 60 tablet 1   No current facility-administered medications for this visit.     REVIEW OF SYSTEMS:   Constitutional: Denies fevers, chills or abnormal night sweats Eyes: Denies blurriness of vision, double vision or watery eyes Ears, nose, mouth, throat, and face: Denies mucositis or sore throat Respiratory: Denies cough, dyspnea or wheezes Cardiovascular: Denies palpitation, chest discomfort or lower extremity swelling Gastrointestinal:  Denies nausea, heartburn or change in bowel habits Skin: Denies abnormal skin rashes Lymphatics: Denies new lymphadenopathy or easy bruising Neurological:Denies numbness, tingling or new weaknesses Behavioral/Psych: Mood is stable, no new changes  All other systems were reviewed with the patient and are negative.  PHYSICAL EXAMINATION: ECOG PERFORMANCE STATUS: 1 - Symptomatic but completely ambulatory  Vitals:   09/05/18 1049  BP: (!) 103/47  Pulse: (!) 112  Resp: 18  Temp: 98.5 F (36.9 C)  SpO2: 100%   Filed Weights   09/05/18 1049  Weight: 150 lb 6.4 oz (68.2 kg)    GENERAL:alert, no distress and comfortable SKIN: skin color, texture, turgor are normal, no rashes or significant lesions EYES: normal, conjunctiva are pink and non-injected, sclera clear OROPHARYNX:no exudate, no erythema and lips, buccal mucosa, and tongue normal  NECK: supple, thyroid normal size, non-tender, without nodularity LYMPH:  no palpable lymphadenopathy in the cervical, axillary or inguinal LUNGS: clear to auscultation and percussion with normal breathing effort HEART: regular  rate & rhythm and no murmurs and no lower extremity edema ABDOMEN:abdomen soft, non-tender and normal bowel sounds.  Limited exam due to her pregnancy Musculoskeletal:no cyanosis of digits and no clubbing  PSYCH: alert & oriented x 3 with fluent speech NEURO: no focal motor/sensory deficits  LABORATORY DATA:  I have reviewed the data as listed Lab Results  Component Value Date   WBC 6.4 09/05/2018   HGB 9.7 (L) 09/05/2018   HCT 30.4 (L) 09/05/2018   MCV 90.2 09/05/2018   PLT 103 (L) 09/05/2018    RADIOGRAPHIC STUDIES: I have personally reviewed the radiological images as listed and agreed with the findings in the report. [place] [facility] Ob Follow Up  Result Date: 09/01/2018 ----------------------------------------------------------------------  OBSTETRICS REPORT                        (Signed Final 09/01/2018 07:30 am) ---------------------------------------------------------------------- Patient Info  ID #:       [id number]  D.O.B.:  11-24-1985 (33 yrs)  Name:       Melinda [name]                  Visit Date: 08/30/2018 03:41 pm ---------------------------------------------------------------------- Performed By  Performed By:     Lenise Arena        Referred By:       [facility]                    RDMS  Attending:        Lin Landsman      [facility]                    MD                                        [facility] ---------------------------------------------------------------------- Orders   #  Description                          Code         Ordered By   1  [place] MFM OB FOLLOW UP                  [phone]     ROLITTA DAWSON  ----------------------------------------------------------------------   #  Order #                    Accession #                 Episode #   1  [id number]                  [id number]                  [id number]  ---------------------------------------------------------------------- Indications   Late to prenatal  care, third trimester         O09.33   Insufficient Prenatal Care                     O09.30   Encounter for other antenatal screening        Z36.2   follow-up   [redacted] weeks gestation of pregnancy                Z3A.36  ---------------------------------------------------------------------- Vital Signs  Weight (lb): 148                               Height:        5'2"  BMI:         27.07 ---------------------------------------------------------------------- Fetal Evaluation  Num Of Fetuses:          1  Fetal Heart Rate(bpm):   149  Cardiac Activity:        Observed  Presentation:            Cephalic  Placenta:                Posterior  P. Cord Insertion:       Previously Visualized  Amniotic Fluid  AFI FV:      Within normal limits  AFI Sum(cm)     %Tile       Largest Pocket(cm)  13.12  47          3.68  RUQ(cm)       RLQ(cm)       LUQ(cm)        LLQ(cm)  3.68          3.22          3.12           3.1 ---------------------------------------------------------------------- Biometry  BPD:        96  mm     G. Age:  39w 1d         98  %    CI:        81.52   %    70 - 86                                                          FL/HC:       20.9  %    20.8 - 22.6  HC:      335.6  mm     G. Age:  38w 3d         6  %    HC/AC:       0.92       0.92 - 1.05  AC:      364.7  mm     G. Age:  40w 3d       > 99  %    FL/BPD:      73.2  %    71 - 87  FL:       70.3  mm     G. Age:  36w 0d         27  %    FL/AC:       19.3  %    20 - 24  HUM:        60  mm     G. Age:  34w 6d         30  %  Est. FW:    3697   gm     8 lb 2 oz     97  % ---------------------------------------------------------------------- OB History  Gravidity:    1         Term:   0        Prem:   0        SAB:   0  TOP:          0       Ectopic:  0        Living: 0 ---------------------------------------------------------------------- Gestational Age  LMP:           35w 3d        Date:  12/25/17                 EDD:   10/01/18  U/S Today:     38w 4d                                         EDD:   09/09/18  Best:          36w 6d     Det. By:  U/S  (06/21/18)  EDD:   09/21/18 ---------------------------------------------------------------------- Anatomy  Cranium:               Appears normal         Aortic Arch:            Previously seen  Cavum:                 Appears normal         Ductal Arch:            Previously seen  Ventricles:            Previously seen        Diaphragm:              Appears normal  Choroid Plexus:        Previously seen        Stomach:                Appears normal, left                                                                        sided  Cerebellum:            Previously seen        Abdomen:                Appears normal  Posterior Fossa:       Previously seen        Abdominal Wall:         Previously seen  Nuchal Fold:           Not applicable (>20    Cord Vessels:           Previously seen                         wks GA)  Face:                  Orbits and profile     Kidneys:                Appear normal                         previously seen  Lips:                  Previously seen        Bladder:                Appears normal  Thoracic:              Appears normal         Spine:                  Previously seen  Heart:                 Previously seen        Upper Extremities:      Previously seen  RVOT:                  Appears normal         Lower Extremities:  Previously seen  LVOT:                  Appears normal  Other:  Nasal bone visualized. Technically difficult due to advanced          gestational age. ---------------------------------------------------------------------- Cervix Uterus Adnexa  Cervix  Not visualized (advanced GA >24wks) ---------------------------------------------------------------------- Impression  Normal interval growth. ---------------------------------------------------------------------- Recommendations  Follow up as clinically indicated.  ----------------------------------------------------------------------               Lin Landsman, MD Electronically Signed Final Report   09/01/2018 07:30 am ----------------------------------------------------------------------  I have personally reviewed her peripheral blood smear Noted occasional clumping.  Absolute thrombocytopenia is noted.  Normochromic, normocytic anemia is noted Normal-appearing white blood cell No schistocytes seen  ASSESSMENT & PLAN Thrombocytopenia affecting pregnancy (HCC) The patient is known to have chronic pancytopenia, with platelet count as low as 51,000 over the past 6 years When I last saw her in 2015, her platelet count was stable She had extensive evaluation including autoimmune screen, vitamin B12 and iron studies Overall, her thrombocytopenia was most consistent with chronic ITP and with concurrent iron deficiency anemia secondary to menorrhagia Her platelet count has been fairly stable, in fact better than her baseline but I suspect it may drop in the near future near the peripartum. I discussed options with the patient The patient really desires to have epidural to assist in childbirth if possible We discussed the risk and benefits benefits of platelet transfusion versus trial of prednisone therapy Ultimately, she is in agreement to try prednisone I will start her on 60 mg daily and see her back next week for further follow-up  Iron deficiency anemia She had history of iron deficiency anemia I will recheck iron study in her next visit   Orders Placed This Encounter  Procedures  . Ferritin    Standing Status:   Future    Standing Expiration Date:   09/05/2019  . CBC with Differential/Platelet    Standing Status:   Future    Standing Expiration Date:   10/10/2019  . Iron and TIBC    Standing Status:   Future    Standing Expiration Date:   10/10/2019   All questions were answered. The patient knows to call the clinic with any problems,  questions or concerns. No barriers to learning was detected. I spent 40 minutes counseling the patient face to face. The total time spent in the appointment was 60 minutes and more than 50% was on counseling and review of test results  Artis Delay, MD 09/05/2018 1:39 PM

## 2018-09-05 NOTE — Assessment & Plan Note (Signed)
The patient is known to have chronic pancytopenia, with platelet count as low as 51,000 over the past 6 years When I last saw her in 2015, her platelet count was stable She had extensive evaluation including autoimmune screen, vitamin B12 and iron studies Overall, her thrombocytopenia was most consistent with chronic ITP and with concurrent iron deficiency anemia secondary to menorrhagia Her platelet count has been fairly stable, in fact better than her baseline but I suspect it may drop in the near future near the peripartum. I discussed options with the patient The patient really desires to have epidural to assist in childbirth if possible We discussed the risk and benefits benefits of platelet transfusion versus trial of prednisone therapy Ultimately, she is in agreement to try prednisone I will start her on 60 mg daily and see her back next week for further follow-up

## 2018-09-05 NOTE — Assessment & Plan Note (Signed)
She had history of iron deficiency anemia I will recheck iron study in her next visit

## 2018-09-05 NOTE — Telephone Encounter (Signed)
I talk with patient regarding schedule  

## 2018-09-06 ENCOUNTER — Telehealth (INDEPENDENT_AMBULATORY_CARE_PROVIDER_SITE_OTHER): Payer: Medicaid Other | Admitting: Obstetrics and Gynecology

## 2018-09-06 ENCOUNTER — Telehealth: Payer: Medicaid Other | Admitting: Obstetrics and Gynecology

## 2018-09-06 ENCOUNTER — Encounter: Payer: Self-pay | Admitting: Obstetrics and Gynecology

## 2018-09-06 VITALS — BP 116/60 | HR 104 | Wt 152.4 lb

## 2018-09-06 DIAGNOSIS — O26843 Uterine size-date discrepancy, third trimester: Secondary | ICD-10-CM

## 2018-09-06 DIAGNOSIS — B009 Herpesviral infection, unspecified: Secondary | ICD-10-CM

## 2018-09-06 DIAGNOSIS — O99119 Other diseases of the blood and blood-forming organs and certain disorders involving the immune mechanism complicating pregnancy, unspecified trimester: Secondary | ICD-10-CM

## 2018-09-06 DIAGNOSIS — O98513 Other viral diseases complicating pregnancy, third trimester: Secondary | ICD-10-CM

## 2018-09-06 DIAGNOSIS — Z3A37 37 weeks gestation of pregnancy: Secondary | ICD-10-CM

## 2018-09-06 DIAGNOSIS — D696 Thrombocytopenia, unspecified: Secondary | ICD-10-CM

## 2018-09-06 DIAGNOSIS — Z3400 Encounter for supervision of normal first pregnancy, unspecified trimester: Secondary | ICD-10-CM

## 2018-09-06 NOTE — Progress Notes (Signed)
MY CHART VIDEO VIRTUAL OBSTETRICS VISIT ENCOUNTER NOTE  I connected with Melinda [name] on 09/06/18 at  1:10 PM EDT by My Chart video at home and verified that I am speaking with the correct person using two identifiers.   I discussed the limitations, risks, security and privacy concerns of performing an evaluation and management service by My Chart video and the availability of in person appointments. I also discussed with the patient that there may be a patient responsible charge related to this service. The patient expressed understanding and agreed to proceed.  Subjective:  Melinda [name] is a 33 y.o. G1P0 at [redacted]w[redacted]d being followed for ongoing prenatal care.  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  Patient reports "some cramping". She had an appt with Dr. Alvy Bimler on 09/05/2018. Dr. Alvy Bimler offered her 3 options: platelet transfusion, taking Prednisone for 1 week and repeat bloodwork, or do nothing. She chose to take Prednisone; which she takes 60 mg with breakfast. She states that Dr. Alvy Bimler said she would need a platelet transfusion, if her platelets have not risen appropriately by next week. Reports fetal movement. Denies any contractions, bleeding or leaking of fluid.   The following portions of the patient's history were reviewed and updated as appropriate: allergies, current medications, past family history, past medical history, past social history, past surgical history and problem list.   Objective:   General:  Alert, oriented  and cooperative.   Mental Status: Normal mood and affect perceived. Normal judgment and thought content.  Rest of physical exam deferred due to type of encounter  BP 116/60 (Patient Position: Sitting)   Pulse (!) 104   Wt 152 lb 6.4 oz (69.1 kg)   LMP 12/25/2017   BMI 27.87 kg/m  **Done by patient's own at home BP cuff and scale  Assessment and Plan:  Pregnancy: G1P0 at [redacted]w[redacted]d  1. Supervision of normal first pregnancy, antepartum - Discussed negative GBS results - Advised will have in office visit in 1 week - Advised will need BPP/NST past 40 wks - will need to schedule at [facility]  2. 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 Thrombocytopenia affecting pregnancy ([facility]) - Continue Prednisone as previously Rx'd - F/U appt with Dr. Alvy Bimler on 09/12/2018 - Find out from Dr. Alvy Bimler what her postpartum f/u will be  4. Uterine size date discrepancy pregnancy, third trimester  Term labor symptoms and general obstetric precautions including but not limited to vaginal bleeding, contractions, leaking of fluid and fetal movement were reviewed in detail with the patient.  I discussed the assessment and treatment plan with the patient. The patient was provided an opportunity to ask questions and all were answered. The patient agreed with the plan and demonstrated an understanding of the instructions. The patient was advised to call back or seek an in-person office evaluation/go to [facility] at [facility] for any urgent or concerning symptoms. Please refer to After Visit Summary for other counseling recommendations.   I provided 10 minutes of non-face-to-face time during this encounter. There was 5 minutes of chart review time spent prior to  this encounter. Total time spent = 15 minutes.  Return in about 1 week (around 09/13/2018) for Return OB visit.  Future Appointments  Date Time Provider Department Center  09/12/2018 10:15 AM  [facility] 3 [facility] None  09/12/2018 10:45 AM Artis DelayGorsuch, Ni, MD [facility] None  09/13/2018  2:50 PM Armando ReichertHogan, Heather D, CNM [facility] None  10/13/2018  2:00 PM Turner, Cornelious Bryantraci R, MD [facility]    Raelyn Moraolitta Ninnie Fein, CNM [facility], [facility]

## 2018-09-12 ENCOUNTER — Telehealth: Payer: Self-pay | Admitting: Hematology and Oncology

## 2018-09-12 ENCOUNTER — Inpatient Hospital Stay: Payer: Medicaid Other | Attending: Hematology and Oncology

## 2018-09-12 ENCOUNTER — Inpatient Hospital Stay (HOSPITAL_BASED_OUTPATIENT_CLINIC_OR_DEPARTMENT_OTHER): Payer: Medicaid Other | Admitting: Hematology and Oncology

## 2018-09-12 ENCOUNTER — Other Ambulatory Visit: Payer: Self-pay

## 2018-09-12 DIAGNOSIS — O99119 Other diseases of the blood and blood-forming organs and certain disorders involving the immune mechanism complicating pregnancy, unspecified trimester: Secondary | ICD-10-CM | POA: Diagnosis not present

## 2018-09-12 DIAGNOSIS — Z79899 Other long term (current) drug therapy: Secondary | ICD-10-CM | POA: Diagnosis not present

## 2018-09-12 DIAGNOSIS — D509 Iron deficiency anemia, unspecified: Secondary | ICD-10-CM | POA: Diagnosis not present

## 2018-09-12 DIAGNOSIS — D696 Thrombocytopenia, unspecified: Secondary | ICD-10-CM

## 2018-09-12 DIAGNOSIS — D693 Immune thrombocytopenic purpura: Secondary | ICD-10-CM | POA: Diagnosis not present

## 2018-09-12 LAB — IRON AND TIBC
Iron: 32 ug/dL — ABNORMAL LOW (ref 41–142)
Saturation Ratios: 7 % — ABNORMAL LOW (ref 21–57)
TIBC: 454 ug/dL — ABNORMAL HIGH (ref 236–444)
UIBC: 422 ug/dL — ABNORMAL HIGH (ref 120–384)

## 2018-09-12 LAB — FERRITIN: Ferritin: 22 ng/mL (ref 11–307)

## 2018-09-12 LAB — CBC WITH DIFFERENTIAL/PLATELET
Abs Immature Granulocytes: 0.58 10*3/uL — ABNORMAL HIGH (ref 0.00–0.07)
Basophils Absolute: 0 10*3/uL (ref 0.0–0.1)
Basophils Relative: 0 %
Eosinophils Absolute: 0 10*3/uL (ref 0.0–0.5)
Eosinophils Relative: 0 %
HCT: 30.3 % — ABNORMAL LOW (ref 36.0–46.0)
Hemoglobin: 9.5 g/dL — ABNORMAL LOW (ref 12.0–15.0)
Immature Granulocytes: 6 %
Lymphocytes Relative: 16 %
Lymphs Abs: 1.6 10*3/uL (ref 0.7–4.0)
MCH: 29 pg (ref 26.0–34.0)
MCHC: 31.4 g/dL (ref 30.0–36.0)
MCV: 92.4 fL (ref 80.0–100.0)
Monocytes Absolute: 0.8 10*3/uL (ref 0.1–1.0)
Monocytes Relative: 8 %
Neutro Abs: 6.8 10*3/uL (ref 1.7–7.7)
Neutrophils Relative %: 70 %
Platelets: 95 10*3/uL — ABNORMAL LOW (ref 150–400)
RBC: 3.28 MIL/uL — ABNORMAL LOW (ref 3.87–5.11)
RDW: 15.2 % (ref 11.5–15.5)
WBC: 9.8 10*3/uL (ref 4.0–10.5)
nRBC: 0.4 % — ABNORMAL HIGH (ref 0.0–0.2)

## 2018-09-12 NOTE — Telephone Encounter (Signed)
I left a message regarding schedule  

## 2018-09-13 ENCOUNTER — Ambulatory Visit (INDEPENDENT_AMBULATORY_CARE_PROVIDER_SITE_OTHER): Payer: Medicaid Other | Admitting: Advanced Practice Midwife

## 2018-09-13 ENCOUNTER — Encounter: Payer: Self-pay | Admitting: Hematology and Oncology

## 2018-09-13 ENCOUNTER — Encounter: Payer: Self-pay | Admitting: Advanced Practice Midwife

## 2018-09-13 VITALS — BP 121/74 | HR 109 | Temp 98.2°F | Wt 156.8 lb

## 2018-09-13 DIAGNOSIS — O99113 Other diseases of the blood and blood-forming organs and certain disorders involving the immune mechanism complicating pregnancy, third trimester: Secondary | ICD-10-CM

## 2018-09-13 DIAGNOSIS — Z3A38 38 weeks gestation of pregnancy: Secondary | ICD-10-CM

## 2018-09-13 DIAGNOSIS — Z3400 Encounter for supervision of normal first pregnancy, unspecified trimester: Secondary | ICD-10-CM

## 2018-09-13 DIAGNOSIS — D696 Thrombocytopenia, unspecified: Secondary | ICD-10-CM

## 2018-09-13 NOTE — Progress Notes (Signed)
   PRENATAL VISIT NOTE  Subjective:  Melinda [name] is a 33 y.o. G1P0 at [redacted]w[redacted]d being seen today for ongoing prenatal care.  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  Patient reports no complaints.  Contractions: Irregular. Vag. Bleeding: None.  Movement: Present. Denies leaking of fluid.   The following portions of the patient's history were reviewed and updated as appropriate: allergies, current medications, past family history, past medical history, past social history, past surgical history and problem list.   Objective:   Vitals:   09/13/18 1453  BP: 121/74  Pulse: (!) 109  Temp: 98.2 F (36.8 C)  Weight: 156 lb 12.8 oz (71.1 kg)    Fetal Status: Fetal Heart Rate (bpm): 147 Fundal Height: 41 cm Movement: Present     General:  Alert, oriented and cooperative. Patient is in no acute distress.  Skin: Skin is warm and dry. No rash noted.   Cardiovascular: Normal heart rate noted  Respiratory: Normal respiratory effort, no problems with respiration noted  Abdomen: Soft, gravid, appropriate for gestational age.  Pain/Pressure: Present     Pelvic: Cervical exam performed Dilation: Closed Effacement (%): Thick Station: -3  Extremities: Normal range of motion.  Edema: None  Mental Status: Normal mood and affect. Normal behavior. Normal judgment and thought content.   Assessment and Plan:  Pregnancy: G1P0 at [redacted]w[redacted]d 1. Supervision of normal first pregnancy, antepartum - Routine care - IOL scheduled, orders placed  2.  Thrombocytopenia affecting pregnancy ([place]) - followed by hematology  - stable on steroids, plan is to continue steroids until delivery.   Ref. Range 09/12/2018 10:11  Platelets Latest Ref Range: 150 - 400 K/uL 95 (L)  - Patient would like to have the option for an epidural. Will attempt to set up anesthesia consult so she can discuss with with them.   Term labor symptoms and general obstetric precautions including but not limited to vaginal bleeding, contractions, leaking of fluid and fetal movement were reviewed in detail with the patient. Please refer to After Visit Summary for other counseling recommendations.   Return in about 1 week (around 09/20/2018) for Needs post dates BPP/NST on 09/25/2018.  Future Appointments  Date Time Provider [place]  09/19/2018 10:15 AM [facility] 2 [facility] None  09/19/2018 10:45 AM Heath Lark, MD [facility] None  09/22/2018  8:10 AM Gavin Pound, CNM [facility] None  09/25/2018  9:15 AM [facility] NST [facility] WOC  09/28/2018 12:00 AM [facility] SCHED ROOM [facility] None  10/13/2018  2:00 PM Sueanne Margarita, MD [facility]    Marcille Buffy DNP, CNM  09/13/18  3:10 PM

## 2018-09-13 NOTE — Progress Notes (Signed)
[facility] OFFICE PROGRESS NOTE  Melinda [name], Melinda [name], Melinda [name]  ASSESSMENT & PLAN:  Thrombocytopenia affecting pregnancy (HCC) Her platelet count is stable with prednisone therapy She tolerated prednisone well We will continue similar dose until delivery She does not need prednisone taper after delivery I will schedule appointment within 2 weeks after delivery to follow on her platelet count In my opinion, her platelet count today is almost at 100,000 I would defer to anesthesiologist to deem whether it is safe for her to proceed with epidural with her current platelet count If this is not enough, she would need platelet transfusion to receive epidural  Iron deficiency anemia She has borderline iron deficiency and anemia secondary to pregnancy I do not recommend oral iron supplement right now but to defer until after delivery to minimize GI side effects   No orders of the defined types were placed in this encounter.   INTERVAL HISTORY: Melinda [name] 33 y.o. female returns for further follow-up She tolerated prednisone well although she does feel hungry and is gaining weight The patient denies any recent signs or symptoms of bleeding such as spontaneous epistaxis, hematuria or hematochezia.   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  ALLERGIES:  has No Known Allergies.  MEDICATIONS:  Current Outpatient Medications  Medication Sig Dispense Refill  . Elastic Bandages & Supports (COMFORT FIT MATERNITY SUPP MED) MISC 1 Device by Does not apply route daily. 1 each 0  . Magnesium 200 MG TABS Take 2 tablets (400 mg total) by mouth at bedtime. 60 tablet 2  . predniSONE (DELTASONE) 20 MG tablet Take 3 tablets (60 mg total) by mouth daily with breakfast. 50 tablet 0  . Prenat-Fe Carbonyl-FA-Omega 3 (ONE-[name]-DAY WOMENS PRENATAL 1) 28-0.8-235 MG CAPS Take 1 capsule by mouth daily. 30 capsule 0  . valACYclovir (VALTREX) 500 MG tablet Take 1 tablet (500 mg total) by mouth 2 (two) times daily. 60 tablet 1   No current facility-administered medications for this visit.      REVIEW OF SYSTEMS:   Constitutional: Denies fevers, chills or night sweats Eyes: Denies blurriness of vision Ears, nose, mouth, throat, and face: Denies mucositis or sore throat Respiratory: Denies cough, dyspnea or wheezes Cardiovascular: Denies palpitation, chest discomfort or lower extremity swelling Gastrointestinal:  Denies nausea, heartburn or change in bowel habits Skin: Denies abnormal skin rashes Lymphatics: Denies new lymphadenopathy or easy bruising Neurological:Denies numbness, tingling or new weaknesses Behavioral/Psych: Mood is stable, no new changes  All other systems were reviewed with the patient and are negative.  PHYSICAL EXAMINATION: ECOG PERFORMANCE STATUS: 1 - Symptomatic but completely ambulatory  Vitals:   09/12/18 1044  BP: 112/60  Pulse: (!) 105  Resp: 18  Temp: 98.5 F (36.9 C)  SpO2: 99%   Filed Weights   09/12/18 1044  Weight: 154 lb 6.4 oz (70 kg)    GENERAL:alert, no distress and comfortable NEURO: alert & oriented x 3 with fluent speech, no  focal motor/sensory deficits  LABORATORY DATA:  I have reviewed the data as listed     Component Value Date/Time   NA 136 09/05/2018 1022   NA 140 03/22/2013 1410   K 3.7 09/05/2018 1022   K 4.3 03/22/2013 1410   CL 108 09/05/2018 1022   CO2 19 (L) 09/05/2018 1022   CO2 20 (L) 03/22/2013 1410   GLUCOSE 90 09/05/2018 1022   GLUCOSE 90 03/22/2013 1410   BUN 7 09/05/2018 1022   BUN 12.3 03/22/2013 1410   CREATININE 0.68 09/05/2018 1022   CREATININE 0.9 03/22/2013 1410   CALCIUM 8.4 (L) 09/05/2018 1022   CALCIUM 9.2 03/22/2013 1410   PROT 5.9 (L) 09/05/2018 1022   PROT 7.2 03/22/2013 1410   ALBUMIN 2.7 (L) 09/05/2018 1022   ALBUMIN 3.9 03/22/2013 1410   AST 16 09/05/2018 1022   AST 33 03/22/2013 1410   ALT 13 09/05/2018 1022   ALT 65 (H) 03/22/2013 1410   ALKPHOS 132 (H) 09/05/2018 1022   ALKPHOS 45 03/22/2013 1410   BILITOT 0.5 09/05/2018 1022   BILITOT 0.59 03/22/2013 1410   GFRNONAA >60 09/05/2018 1022   GFRAA >60 09/05/2018 1022    No results found for: SPEP, UPEP  Lab Results  Component Value Date   WBC 9.8 09/12/2018   NEUTROABS 6.8 09/12/2018   HGB 9.5 (L) 09/12/2018   HCT 30.3 (L) 09/12/2018   MCV 92.4 09/12/2018   PLT 95 (L) 09/12/2018      Chemistry      Component Value Date/Time   NA 136 09/05/2018 1022   NA 140 03/22/2013 1410   K 3.7 09/05/2018 1022   K 4.3 03/22/2013 1410   CL 108 09/05/2018 1022   CO2 19 (L) 09/05/2018 1022   CO2 20 (L) 03/22/2013 1410   BUN 7 09/05/2018 1022   BUN 12.3 03/22/2013 1410   CREATININE 0.68 09/05/2018 1022   CREATININE 0.9 03/22/2013 1410      Component Value Date/Time   CALCIUM 8.4 (L) 09/05/2018 1022   CALCIUM 9.2 03/22/2013 1410   ALKPHOS 132 (H) 09/05/2018 1022   ALKPHOS 45 03/22/2013 1410   AST 16 09/05/2018 1022   AST 33 03/22/2013 1410   ALT 13 09/05/2018 1022   ALT 65 (H) 03/22/2013 1410   BILITOT 0.5 09/05/2018 1022   BILITOT 0.59 03/22/2013 1410       I spent 10 minutes counseling the  patient face to face. The total time spent in the appointment was 15 minutes and more than 50% was on counseling.   All questions were answered. The patient knows to call the clinic with any problems, questions or concerns. No barriers to learning was detected.    Heath Lark, Melinda [name] 9/2/20207:59 AM

## 2018-09-13 NOTE — Patient Instructions (Signed)
Places to have your son circumcised:                                                                      [facility]     [phone]   $480 while you are in hospital         [facility]              [phone]   $[phone] by 4 wks                      [facility]                     [phone]   $[phone] by 7 days [facility]                    [phone]   $[phone] by 4 wks [facility]             [phone]   $225 by 2 wks    These prices sometimes change but are roughly what you can expect to pay. Please call and confirm pricing.   Circumcision is considered an elective/non-medically necessary procedure. There are many reasons parents decide to have their sons circumsized. During the first year of life circumcised males have a reduced risk of urinary tract infections but after this year the rates between circumcised males and uncircumcised males are the same.  It is safe to have your son circumcised outside of the hospital and the places above perform them regularly.   Deciding about Circumcision in Baby Boys  (Up-to-date The Basics)  What is circumcision?   Circumcision is a surgery that removes the skin that covers the tip of the penis, called the "foreskin" Circumcision is usually done when a boy is between 1 and 10 days old. In the [place], circumcision is common. In some other countries, fewer boys are circumcised. Circumcision is a common tradition in some religions.  Should I have my baby boy circumcised?   There is no easy answer. Circumcision has some benefits. But it also has risks. After talking with your doctor, you will have to decide for yourself what is right for your family.  What are the benefits of circumcision?   Circumcised boys seem to have slightly lower rates of: ?Urinary tract infections ?Swelling of the opening at the tip of the penis Circumcised men seem to have slightly lower rates of: ?Urinary tract infections ?Swelling of the opening at the tip of the penis ?Penis cancer ?HIV  and other infections that you catch during sex ?Cervical cancer in the women they have sex with Even so, in the [place], the risks of these problems are small - even in boys and men who have not been circumcised. Plus, boys and men who are not circumcised can reduce these extra risks by: ?Cleaning their penis well ?Using condoms during sex  What are the risks of circumcision?  Risks include: ?Bleeding or infection from the surgery ?Damage to or amputation of the penis ?A chance that the doctor will cut off too much or not enough of the foreskin ?A chance that sex won't feel as good later in life Only about 1 out of every 200   circumcisions leads to problems. There is also a chance that your health insurance won't pay for circumcision.  How is circumcision done in baby boys?  First, the baby gets medicine for pain relief. This might be a cream on the skin or a shot into the base of the penis. Next, the doctor cleans the baby's penis well. Then he or she uses special tools to cut off the foreskin. Finally, the doctor wraps a bandage (called gauze) around the baby's penis. If you have your baby circumcised, his doctor or nurse will give you instructions on how to care for him after the surgery. It is important that you follow those instructions carefully.  

## 2018-09-13 NOTE — Assessment & Plan Note (Signed)
Her platelet count is stable with prednisone therapy She tolerated prednisone well We will continue similar dose until delivery She does not need prednisone taper after delivery I will schedule appointment within 2 weeks after delivery to follow on her platelet count In my opinion, her platelet count today is almost at 100,000 I would defer to anesthesiologist to deem whether it is safe for her to proceed with epidural with her current platelet count If this is not enough, she would need platelet transfusion to receive epidural

## 2018-09-13 NOTE — Assessment & Plan Note (Signed)
She has borderline iron deficiency and anemia secondary to pregnancy I do not recommend oral iron supplement right now but to defer until after delivery to minimize GI side effects

## 2018-09-14 ENCOUNTER — Telehealth (HOSPITAL_COMMUNITY): Payer: Self-pay | Admitting: *Deleted

## 2018-09-14 NOTE — Telephone Encounter (Signed)
Preadmission screen  

## 2018-09-15 ENCOUNTER — Telehealth (HOSPITAL_COMMUNITY): Payer: Self-pay | Admitting: *Deleted

## 2018-09-15 ENCOUNTER — Encounter (HOSPITAL_COMMUNITY): Payer: Self-pay | Admitting: *Deleted

## 2018-09-15 ENCOUNTER — Other Ambulatory Visit: Payer: Self-pay | Admitting: Hematology and Oncology

## 2018-09-15 DIAGNOSIS — D696 Thrombocytopenia, unspecified: Secondary | ICD-10-CM

## 2018-09-15 NOTE — Telephone Encounter (Signed)
Preadmission screen  

## 2018-09-19 ENCOUNTER — Inpatient Hospital Stay (HOSPITAL_BASED_OUTPATIENT_CLINIC_OR_DEPARTMENT_OTHER): Payer: Medicaid Other | Admitting: Hematology and Oncology

## 2018-09-19 ENCOUNTER — Encounter: Payer: Self-pay | Admitting: Hematology and Oncology

## 2018-09-19 ENCOUNTER — Telehealth: Payer: Self-pay | Admitting: Hematology and Oncology

## 2018-09-19 ENCOUNTER — Inpatient Hospital Stay: Payer: Medicaid Other

## 2018-09-19 ENCOUNTER — Other Ambulatory Visit: Payer: Self-pay

## 2018-09-19 DIAGNOSIS — D696 Thrombocytopenia, unspecified: Secondary | ICD-10-CM

## 2018-09-19 DIAGNOSIS — O99119 Other diseases of the blood and blood-forming organs and certain disorders involving the immune mechanism complicating pregnancy, unspecified trimester: Secondary | ICD-10-CM

## 2018-09-19 DIAGNOSIS — D509 Iron deficiency anemia, unspecified: Secondary | ICD-10-CM | POA: Diagnosis not present

## 2018-09-19 LAB — CBC WITH DIFFERENTIAL/PLATELET
Abs Immature Granulocytes: 0.65 10*3/uL — ABNORMAL HIGH (ref 0.00–0.07)
Basophils Absolute: 0 10*3/uL (ref 0.0–0.1)
Basophils Relative: 0 %
Eosinophils Absolute: 0 10*3/uL (ref 0.0–0.5)
Eosinophils Relative: 0 %
HCT: 32.2 % — ABNORMAL LOW (ref 36.0–46.0)
Hemoglobin: 10.1 g/dL — ABNORMAL LOW (ref 12.0–15.0)
Immature Granulocytes: 5 %
Lymphocytes Relative: 15 %
Lymphs Abs: 1.9 10*3/uL (ref 0.7–4.0)
MCH: 28.1 pg (ref 26.0–34.0)
MCHC: 31.4 g/dL (ref 30.0–36.0)
MCV: 89.7 fL (ref 80.0–100.0)
Monocytes Absolute: 0.8 10*3/uL (ref 0.1–1.0)
Monocytes Relative: 6 %
Neutro Abs: 10 10*3/uL — ABNORMAL HIGH (ref 1.7–7.7)
Neutrophils Relative %: 74 %
Platelets: 116 10*3/uL — ABNORMAL LOW (ref 150–400)
RBC: 3.59 MIL/uL — ABNORMAL LOW (ref 3.87–5.11)
RDW: 15.9 % — ABNORMAL HIGH (ref 11.5–15.5)
WBC: 13.4 10*3/uL — ABNORMAL HIGH (ref 4.0–10.5)
nRBC: 0 % (ref 0.0–0.2)

## 2018-09-19 LAB — COMPREHENSIVE METABOLIC PANEL
ALT: 28 U/L (ref 0–44)
AST: 17 U/L (ref 15–41)
Albumin: 2.9 g/dL — ABNORMAL LOW (ref 3.5–5.0)
Alkaline Phosphatase: 138 U/L — ABNORMAL HIGH (ref 38–126)
Anion gap: 9 (ref 5–15)
BUN: 8 mg/dL (ref 6–20)
CO2: 21 mmol/L — ABNORMAL LOW (ref 22–32)
Calcium: 8.8 mg/dL — ABNORMAL LOW (ref 8.9–10.3)
Chloride: 108 mmol/L (ref 98–111)
Creatinine, Ser: 0.75 mg/dL (ref 0.44–1.00)
GFR calc Af Amer: >60 mL/min (ref >60)
GFR calc non Af Amer: >60 mL/min (ref >60)
Glucose, Bld: 103 mg/dL — ABNORMAL HIGH (ref 70–99)
Potassium: 3.6 mmol/L (ref 3.5–5.1)
Sodium: 138 mmol/L (ref 135–145)
Total Bilirubin: 0.4 mg/dL (ref 0.3–1.2)
Total Protein: 6.2 g/dL — ABNORMAL LOW (ref 6.5–8.1)

## 2018-09-19 NOTE — Telephone Encounter (Signed)
Patient called office this morning stating she was having contractions about 6 mins apart. The contractions started yesterday about 30 mins apart and she lost her mucous plug. When she wiped, there was a pinkish like discharge. Pt stated she was only having period like cramping. Advised patient to be seen at MAU.  Derl Barrow, RN

## 2018-09-19 NOTE — Progress Notes (Signed)
[facility] OFFICE PROGRESS NOTE  Melinda [name], Stephen A, MD  ASSESSMENT & PLAN:  Thrombocytopenia affecting pregnancy [facility](HCC) The patient has excellent response to prednisone She is having contraction and might be delivered soon I will stop prednisone therapy; she does not need taper course There is no contraindication from my standpoint for her to proceed with epidural of cesarean section if indicated I plan to see her back again in 3 weeks for further follow-up  Iron deficiency anemia She has borderline iron deficiency After delivery, I would recommend oral iron supplement I will see her at the end of the month for further follow-up   No orders of the defined types were placed in this encounter.   INTERVAL HISTORY: Melinda [name] 33 y.o. female returns for further follow-up She is having active contraction  She tolerated prednisone well The patient denies any recent signs or symptoms of bleeding such as spontaneous epistaxis, hematuria or hematochezia.   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  ALLERGIES:  has No Known Allergies.  MEDICATIONS:  Current  Outpatient Medications  Medication Sig Dispense Refill  . Elastic Bandages & Supports (COMFORT FIT MATERNITY SUPP MED) MISC 1 Device by Does not apply route daily. 1 each 0  . Magnesium 200 MG TABS Take 2 tablets (400 mg total) by mouth at bedtime. 60 tablet 2  . Prenat-Fe Carbonyl-FA-Omega 3 (ONE-A-DAY WOMENS PRENATAL 1) 28-0.8-235 MG CAPS Take 1 capsule by mouth daily. 30 capsule 0  . valACYclovir (VALTREX) 500 MG tablet Take 1 tablet (500 mg total) by mouth 2 (two) times daily. 60 tablet 1   No current facility-administered medications for this visit.      REVIEW OF SYSTEMS:   Constitutional: Denies fevers, chills or night sweats Eyes: Denies blurriness of vision Ears, nose, mouth, throat, and face: Denies mucositis or sore throat Respiratory: Denies cough, dyspnea or wheezes Cardiovascular: Denies palpitation, chest discomfort or lower extremity swelling Gastrointestinal:  Denies nausea, heartburn or change in bowel habits Skin: Denies abnormal skin rashes Lymphatics: Denies new lymphadenopathy or easy bruising Neurological:Denies numbness, tingling or new weaknesses Behavioral/Psych: Mood is stable, no new changes  All other systems were reviewed with the patient and are negative.  PHYSICAL EXAMINATION: ECOG PERFORMANCE STATUS: 1 - Symptomatic but completely ambulatory  Vitals:   09/19/18 1056  BP: 123/74  Pulse: (!) 123  Resp: (!) 22  Temp: 99.1 F (37.3 C)  SpO2: 100%   Filed Weights   09/19/18 1056  Weight: 157 lb 12.8 oz (71.6 kg)    GENERAL:alert, no distress and comfortable Musculoskeletal:no cyanosis of digits and no clubbing  NEURO: alert & oriented x 3 with fluent speech, no focal motor/sensory deficits  LABORATORY DATA:  I have reviewed the data as listed     Component Value Date/Time   NA 138 09/19/2018 1030   NA 140 03/22/2013 1410   K 3.6 09/19/2018 1030   K 4.3 03/22/2013 1410   CL 108 09/19/2018 1030   CO2 21 (L) 09/19/2018 1030   CO2 20 (L)  03/22/2013 1410   GLUCOSE 103 (H) 09/19/2018 1030   GLUCOSE 90 03/22/2013 1410   BUN 8 09/19/2018 1030   BUN 12.3 03/22/2013 1410   CREATININE 0.75 09/19/2018 1030   CREATININE 0.68 09/05/2018 1022   CREATININE 0.9 03/22/2013 1410   CALCIUM 8.8 (L) 09/19/2018 1030   CALCIUM 9.2 03/22/2013 1410   PROT 6.2 (L) 09/19/2018 1030   PROT 7.2 03/22/2013 1410   ALBUMIN 2.9 (L) 09/19/2018 1030   ALBUMIN 3.9 03/22/2013 1410   AST 17 09/19/2018 1030   AST 16 09/05/2018 1022   AST 33 03/22/2013 1410   ALT 28 09/19/2018 1030   ALT 13 09/05/2018 1022   ALT 65 (H) 03/22/2013 1410   ALKPHOS 138 (H) 09/19/2018 1030   ALKPHOS 45 03/22/2013 1410   BILITOT 0.4 09/19/2018 1030   BILITOT 0.5 09/05/2018 1022   BILITOT 0.59 03/22/2013 1410   GFRNONAA >60 09/19/2018 1030   GFRNONAA >60 09/05/2018 1022   GFRAA >60 09/19/2018 1030   GFRAA >60 09/05/2018 1022    No results found for: SPEP, UPEP  Lab Results  Component Value Date   WBC 13.4 (H) 09/19/2018   NEUTROABS 10.0 (H) 09/19/2018   HGB 10.1 (L) 09/19/2018   HCT 32.2 (L) 09/19/2018   MCV 89.7 09/19/2018   PLT 116 (L) 09/19/2018      Chemistry      Component Value Date/Time   NA 138 09/19/2018 1030   NA 140 03/22/2013 1410   K 3.6 09/19/2018 1030   K 4.3 03/22/2013 1410   CL 108 09/19/2018 1030   CO2 21 (L) 09/19/2018 1030   CO2 20 (L) 03/22/2013 1410   BUN 8 09/19/2018 1030   BUN 12.3 03/22/2013 1410   CREATININE 0.75 09/19/2018 1030   CREATININE 0.68 09/05/2018 1022   CREATININE 0.9 03/22/2013 1410      Component Value Date/Time   CALCIUM 8.8 (L) 09/19/2018 1030   CALCIUM 9.2 03/22/2013 1410   ALKPHOS 138 (H) 09/19/2018 1030   ALKPHOS 45 03/22/2013 1410   AST 17 09/19/2018 1030   AST 16 09/05/2018 1022   AST 33 03/22/2013 1410   ALT 28 09/19/2018 1030   ALT 13 09/05/2018 1022   ALT 65 (H) 03/22/2013 1410   BILITOT 0.4 09/19/2018 1030   BILITOT 0.5 09/05/2018 1022   BILITOT 0.59 03/22/2013 1410       I spent 10  minutes counseling the patient face to face. The total time spent in the appointment was 15 minutes and more than 50% was on counseling.   All questions were answered. The patient knows to call the clinic with any problems, questions or concerns. No barriers to learning was detected.    Heath Lark, MD 9/8/202011:26 AM

## 2018-09-19 NOTE — Telephone Encounter (Signed)
I talk with patient regarding schedule  

## 2018-09-19 NOTE — Assessment & Plan Note (Signed)
The patient has excellent response to prednisone She is having contraction and might be delivered soon I will stop prednisone therapy; she does not need taper course There is no contraindication from my standpoint for her to proceed with epidural of cesarean section if indicated I plan to see her back again in 3 weeks for further follow-up

## 2018-09-19 NOTE — Assessment & Plan Note (Signed)
She has borderline iron deficiency After delivery, I would recommend oral iron supplement I will see her at the end of the month for further follow-up

## 2018-09-20 ENCOUNTER — Other Ambulatory Visit: Payer: Self-pay

## 2018-09-20 ENCOUNTER — Encounter (HOSPITAL_COMMUNITY)
Admission: AD | Disposition: A | Discharge status: Home / Self Care | Payer: Self-pay | Source: Home / Self Care | Attending: Obstetrics and Gynecology

## 2018-09-20 ENCOUNTER — Encounter (HOSPITAL_COMMUNITY): Payer: Self-pay

## 2018-09-20 ENCOUNTER — Inpatient Hospital Stay (HOSPITAL_COMMUNITY): Payer: Medicaid Other | Admitting: Anesthesiology

## 2018-09-20 ENCOUNTER — Inpatient Hospital Stay (HOSPITAL_COMMUNITY)
Admission: AD | Admit: 2018-09-20 | Discharge: 2018-09-23 | DRG: 787 | Disposition: A | Discharge status: Home / Self Care | Payer: Medicaid Other | Attending: Obstetrics and Gynecology | Admitting: Obstetrics and Gynecology

## 2018-09-20 DIAGNOSIS — D693 Immune thrombocytopenic purpura: Secondary | ICD-10-CM | POA: Diagnosis present

## 2018-09-20 DIAGNOSIS — O9902 Anemia complicating childbirth: Secondary | ICD-10-CM | POA: Diagnosis present

## 2018-09-20 DIAGNOSIS — O9832 Other infections with a predominantly sexual mode of transmission complicating childbirth: Secondary | ICD-10-CM | POA: Diagnosis present

## 2018-09-20 DIAGNOSIS — A6000 Herpesviral infection of urogenital system, unspecified: Secondary | ICD-10-CM | POA: Diagnosis present

## 2018-09-20 DIAGNOSIS — O4290 Premature rupture of membranes, unspecified as to length of time between rupture and onset of labor, unspecified weeks of gestation: Secondary | ICD-10-CM

## 2018-09-20 DIAGNOSIS — O26893 Other specified pregnancy related conditions, third trimester: Secondary | ICD-10-CM | POA: Diagnosis present

## 2018-09-20 DIAGNOSIS — Z3A39 39 weeks gestation of pregnancy: Secondary | ICD-10-CM

## 2018-09-20 DIAGNOSIS — Z20828 Contact with and (suspected) exposure to other viral communicable diseases: Secondary | ICD-10-CM | POA: Diagnosis present

## 2018-09-20 DIAGNOSIS — O99119 Other diseases of the blood and blood-forming organs and certain disorders involving the immune mechanism complicating pregnancy, unspecified trimester: Secondary | ICD-10-CM | POA: Diagnosis present

## 2018-09-20 DIAGNOSIS — O9912 Other diseases of the blood and blood-forming organs and certain disorders involving the immune mechanism complicating childbirth: Secondary | ICD-10-CM | POA: Diagnosis present

## 2018-09-20 DIAGNOSIS — D696 Thrombocytopenia, unspecified: Secondary | ICD-10-CM

## 2018-09-20 DIAGNOSIS — O98513 Other viral diseases complicating pregnancy, third trimester: Secondary | ICD-10-CM | POA: Diagnosis present

## 2018-09-20 DIAGNOSIS — B009 Herpesviral infection, unspecified: Secondary | ICD-10-CM | POA: Diagnosis present

## 2018-09-20 DIAGNOSIS — D509 Iron deficiency anemia, unspecified: Secondary | ICD-10-CM | POA: Diagnosis present

## 2018-09-20 LAB — CBC
HCT: 32.4 % — ABNORMAL LOW (ref 36.0–46.0)
HCT: 31.6 % — ABNORMAL LOW (ref 36.0–46.0)
Hemoglobin: 10.3 g/dL — ABNORMAL LOW (ref 12.0–15.0)
Hemoglobin: 9.9 g/dL — ABNORMAL LOW (ref 12.0–15.0)
MCH: 28.8 pg (ref 26.0–34.0)
MCH: 28.3 pg (ref 26.0–34.0)
MCHC: 31.8 g/dL (ref 30.0–36.0)
MCHC: 31.3 g/dL (ref 30.0–36.0)
MCV: 90.5 fL (ref 80.0–100.0)
MCV: 90.3 fL (ref 80.0–100.0)
Platelets: 109 10*3/uL — ABNORMAL LOW (ref 150–400)
Platelets: 89 10*3/uL — ABNORMAL LOW (ref 150–400)
RBC: 3.58 MIL/uL — ABNORMAL LOW (ref 3.87–5.11)
RBC: 3.5 MIL/uL — ABNORMAL LOW (ref 3.87–5.11)
RDW: 15.9 % — ABNORMAL HIGH (ref 11.5–15.5)
RDW: 16.3 % — ABNORMAL HIGH (ref 11.5–15.5)
WBC: 14.4 10*3/uL — ABNORMAL HIGH (ref 4.0–10.5)
WBC: 11.7 10*3/uL — ABNORMAL HIGH (ref 4.0–10.5)
nRBC: 0 % (ref 0.0–0.2)
nRBC: 0 % (ref 0.0–0.2)

## 2018-09-20 LAB — SARS CORONAVIRUS 2 BY RT PCR (HOSPITAL ORDER, PERFORMED IN ~~LOC~~ HOSPITAL LAB): SARS Coronavirus 2: NEGATIVE

## 2018-09-20 LAB — TYPE AND SCREEN
ABO/RH(D): O POS
Antibody Screen: NEGATIVE

## 2018-09-20 LAB — POCT FERN TEST: POCT Fern Test: POSITIVE

## 2018-09-20 LAB — ABO/RH: ABO/RH(D): O POS

## 2018-09-20 LAB — RPR: RPR Ser Ql: NONREACTIVE

## 2018-09-20 SURGERY — Surgical Case
Anesthesia: Spinal | Site: Abdomen | Wound class: Clean Contaminated

## 2018-09-20 MED ORDER — DEXAMETHASONE SODIUM PHOSPHATE 10 MG/ML IJ SOLN
INTRAMUSCULAR | Status: AC
  Filled 2018-09-20: qty 1

## 2018-09-20 MED ORDER — FENTANYL CITRATE (PF) 100 MCG/2ML IJ SOLN
100.0000 ug | INTRAMUSCULAR | Status: DC | PRN
  Administered 2018-09-20: 100 ug via INTRAVENOUS
  Filled 2018-09-20: qty 2

## 2018-09-20 MED ORDER — OXYTOCIN 40 UNITS IN NORMAL SALINE INFUSION - SIMPLE MED
1.0000 m[IU]/min | INTRAVENOUS | Status: DC
  Administered 2018-09-20: 12:00:00 2 m[IU]/min via INTRAVENOUS
  Administered 2018-09-20: 4 m[IU]/min via INTRAVENOUS

## 2018-09-20 MED ORDER — METHYLERGONOVINE MALEATE 0.2 MG/ML IJ SOLN
INTRAMUSCULAR | Status: DC | PRN
  Administered 2018-09-20: 0.2 mg via INTRAMUSCULAR

## 2018-09-20 MED ORDER — SCOPOLAMINE 1 MG/3DAYS TD PT72: 1.0000 | MEDICATED_PATCH | Freq: Once | TRANSDERMAL | Status: DC

## 2018-09-20 MED ORDER — OXYCODONE-ACETAMINOPHEN 5-325 MG PO TABS: 1.0000 | ORAL_TABLET | ORAL | Status: DC | PRN

## 2018-09-20 MED ORDER — CEFAZOLIN SODIUM-DEXTROSE 2-4 GM/100ML-% IV SOLN
2.0000 g | INTRAVENOUS | Status: AC
  Administered 2018-09-20: 2 g via INTRAVENOUS

## 2018-09-20 MED ORDER — MORPHINE SULFATE (PF) 0.5 MG/ML IJ SOLN
INTRAMUSCULAR | Status: AC
  Filled 2018-09-20: qty 10

## 2018-09-20 MED ORDER — FENTANYL CITRATE (PF) 100 MCG/2ML IJ SOLN
INTRAMUSCULAR | Status: DC | PRN
  Administered 2018-09-20: 15 ug via INTRATHECAL

## 2018-09-20 MED ORDER — NALBUPHINE HCL 10 MG/ML IJ SOLN
5.0000 mg | INTRAMUSCULAR | Status: DC | PRN
  Filled 2018-09-20: qty 0.5

## 2018-09-20 MED ORDER — ONDANSETRON HCL 4 MG/2ML IJ SOLN: 4.0000 mg | Freq: Four times a day (QID) | INTRAMUSCULAR | Status: DC | PRN

## 2018-09-20 MED ORDER — OXYTOCIN 40 UNITS IN NORMAL SALINE INFUSION - SIMPLE MED
2.5000 [IU]/h | INTRAVENOUS | Status: DC
  Filled 2018-09-20: qty 1000

## 2018-09-20 MED ORDER — OXYCODONE-ACETAMINOPHEN 5-325 MG PO TABS: 2.0000 | ORAL_TABLET | ORAL | Status: DC | PRN

## 2018-09-20 MED ORDER — IBUPROFEN 800 MG PO TABS: 800.0000 mg | ORAL_TABLET | Freq: Four times a day (QID) | ORAL | Status: DC

## 2018-09-20 MED ORDER — HYDROCODONE-ACETAMINOPHEN 5-325 MG PO TABS
1.0000 | ORAL_TABLET | ORAL | Status: DC | PRN
  Administered 2018-09-21: 2 via ORAL
  Administered 2018-09-21: 18:00:00 1 via ORAL
  Administered 2018-09-23: 2 via ORAL
  Administered 2018-09-23: 1 via ORAL
  Filled 2018-09-20: qty 2
  Filled 2018-09-20: qty 1
  Filled 2018-09-20 (×2): qty 2

## 2018-09-20 MED ORDER — MEPERIDINE HCL 25 MG/ML IJ SOLN: 6.2500 mg | INTRAMUSCULAR | Status: DC | PRN

## 2018-09-20 MED ORDER — OXYTOCIN 40 UNITS IN NORMAL SALINE INFUSION - SIMPLE MED
INTRAVENOUS | Status: AC
  Filled 2018-09-20: qty 1000

## 2018-09-20 MED ORDER — MEPERIDINE HCL 25 MG/ML IJ SOLN
INTRAMUSCULAR | Status: DC | PRN
  Administered 2018-09-20 (×2): 12.5 mg via INTRAVENOUS

## 2018-09-20 MED ORDER — TERBUTALINE SULFATE 1 MG/ML IJ SOLN
0.2500 mg | Freq: Once | INTRAMUSCULAR | Status: DC | PRN
  Filled 2018-09-20: qty 1

## 2018-09-20 MED ORDER — LACTATED RINGERS IV SOLN
INTRAVENOUS | Status: DC
  Administered 2018-09-21: 03:00:00 via INTRAVENOUS

## 2018-09-20 MED ORDER — ACETAMINOPHEN 325 MG PO TABS: 650.0000 mg | ORAL_TABLET | ORAL | Status: DC | PRN

## 2018-09-20 MED ORDER — SENNOSIDES-DOCUSATE SODIUM 8.6-50 MG PO TABS
2.0000 | ORAL_TABLET | ORAL | Status: DC
  Administered 2018-09-20 – 2018-09-22 (×3): 2 via ORAL
  Filled 2018-09-20 (×3): qty 2

## 2018-09-20 MED ORDER — NALBUPHINE HCL 10 MG/ML IJ SOLN
5.0000 mg | Freq: Once | INTRAMUSCULAR | Status: DC | PRN
  Filled 2018-09-20: qty 0.5

## 2018-09-20 MED ORDER — ACETAMINOPHEN 500 MG PO TABS
1000.0000 mg | ORAL_TABLET | Freq: Four times a day (QID) | ORAL | Status: AC
  Administered 2018-09-20 – 2018-09-21 (×3): 1000 mg via ORAL
  Filled 2018-09-20 (×4): qty 2

## 2018-09-20 MED ORDER — OXYTOCIN BOLUS FROM INFUSION: 500.0000 mL | Freq: Once | INTRAVENOUS | Status: DC

## 2018-09-20 MED ORDER — ONDANSETRON HCL 4 MG/2ML IJ SOLN: 4.0000 mg | Freq: Three times a day (TID) | INTRAMUSCULAR | Status: DC | PRN

## 2018-09-20 MED ORDER — DIPHENHYDRAMINE HCL 25 MG PO CAPS: 25.0000 mg | ORAL_CAPSULE | ORAL | Status: DC | PRN

## 2018-09-20 MED ORDER — SIMETHICONE 80 MG PO CHEW
80.0000 mg | CHEWABLE_TABLET | ORAL | Status: DC
  Administered 2018-09-20 – 2018-09-22 (×2): 80 mg via ORAL
  Filled 2018-09-20 (×3): qty 1

## 2018-09-20 MED ORDER — MEPERIDINE HCL 25 MG/ML IJ SOLN
INTRAMUSCULAR | Status: AC
  Filled 2018-09-20: qty 1

## 2018-09-20 MED ORDER — SODIUM CHLORIDE 0.9% FLUSH: 3.0000 mL | INTRAVENOUS | Status: DC | PRN

## 2018-09-20 MED ORDER — TERBUTALINE SULFATE 1 MG/ML IJ SOLN
0.2500 mg | Freq: Once | INTRAMUSCULAR | Status: AC | PRN
  Administered 2018-09-20: 0.25 mg via SUBCUTANEOUS

## 2018-09-20 MED ORDER — LACTATED RINGERS IV SOLN: INTRAVENOUS | Status: DC

## 2018-09-20 MED ORDER — LACTATED RINGERS IV SOLN
500.0000 mL | INTRAVENOUS | Status: DC | PRN
  Administered 2018-09-20: 500 mL via INTRAVENOUS

## 2018-09-20 MED ORDER — OXYTOCIN 40 UNITS IN NORMAL SALINE INFUSION - SIMPLE MED: 2.5000 [IU]/h | INTRAVENOUS | Status: DC

## 2018-09-20 MED ORDER — PROMETHAZINE HCL 25 MG/ML IJ SOLN: 6.2500 mg | INTRAMUSCULAR | Status: DC | PRN

## 2018-09-20 MED ORDER — DIPHENHYDRAMINE HCL 25 MG PO CAPS: 25.0000 mg | ORAL_CAPSULE | Freq: Four times a day (QID) | ORAL | Status: DC | PRN

## 2018-09-20 MED ORDER — SODIUM CHLORIDE 0.9 % IV SOLN
500.0000 mg | INTRAVENOUS | Status: AC
  Administered 2018-09-20: 17:00:00 500 mg via INTRAVENOUS

## 2018-09-20 MED ORDER — DIPHENHYDRAMINE HCL 50 MG/ML IJ SOLN: 12.5000 mg | INTRAMUSCULAR | Status: DC | PRN

## 2018-09-20 MED ORDER — LACTATED RINGERS IV SOLN: 500.0000 mL | INTRAVENOUS | Status: DC | PRN

## 2018-09-20 MED ORDER — SIMETHICONE 80 MG PO CHEW: 80.0000 mg | CHEWABLE_TABLET | ORAL | Status: DC | PRN

## 2018-09-20 MED ORDER — SOD CITRATE-CITRIC ACID 500-334 MG/5ML PO SOLN: 30.0000 mL | ORAL | Status: DC | PRN

## 2018-09-20 MED ORDER — WITCH HAZEL-GLYCERIN EX PADS: 1.0000 "application " | MEDICATED_PAD | CUTANEOUS | Status: DC | PRN

## 2018-09-20 MED ORDER — BUPIVACAINE IN DEXTROSE 0.75-8.25 % IT SOLN
INTRATHECAL | Status: DC | PRN
  Administered 2018-09-20: 1.8 mL via INTRATHECAL

## 2018-09-20 MED ORDER — OXYCODONE HCL 5 MG PO TABS: 5.0000 mg | ORAL_TABLET | Freq: Once | ORAL | Status: DC | PRN

## 2018-09-20 MED ORDER — FENTANYL-BUPIVACAINE-NACL 0.5-0.125-0.9 MG/250ML-% EP SOLN: 12.0000 mL/h | EPIDURAL | Status: DC | PRN

## 2018-09-20 MED ORDER — NALOXONE HCL 4 MG/10ML IJ SOLN
1.0000 ug/kg/h | INTRAVENOUS | Status: DC | PRN
  Filled 2018-09-20: qty 5

## 2018-09-20 MED ORDER — MORPHINE SULFATE (PF) 0.5 MG/ML IJ SOLN
INTRAMUSCULAR | Status: DC | PRN
  Administered 2018-09-20: .15 mg via INTRATHECAL

## 2018-09-20 MED ORDER — SIMETHICONE 80 MG PO CHEW
80.0000 mg | CHEWABLE_TABLET | Freq: Three times a day (TID) | ORAL | Status: DC
  Administered 2018-09-21 – 2018-09-23 (×7): 80 mg via ORAL
  Filled 2018-09-20 (×8): qty 1

## 2018-09-20 MED ORDER — COCONUT OIL OIL: 1.0000 "application " | TOPICAL_OIL | Status: DC | PRN

## 2018-09-20 MED ORDER — SODIUM CHLORIDE 0.9 % IV SOLN
INTRAVENOUS | Status: DC | PRN
  Administered 2018-09-20: 40 [IU] via INTRAVENOUS

## 2018-09-20 MED ORDER — EPHEDRINE 5 MG/ML INJ: 10.0000 mg | INTRAVENOUS | Status: DC | PRN

## 2018-09-20 MED ORDER — PHENYLEPHRINE HCL-NACL 20-0.9 MG/250ML-% IV SOLN
INTRAVENOUS | Status: AC
  Filled 2018-09-20: qty 250

## 2018-09-20 MED ORDER — LACTATED RINGERS AMNIOINFUSION
INTRAVENOUS | Status: DC
  Administered 2018-09-20: 14:00:00 via INTRAUTERINE

## 2018-09-20 MED ORDER — PHENYLEPHRINE 40 MCG/ML (10ML) SYRINGE FOR IV PUSH (FOR BLOOD PRESSURE SUPPORT): 80.0000 ug | PREFILLED_SYRINGE | INTRAVENOUS | Status: DC | PRN

## 2018-09-20 MED ORDER — FENTANYL CITRATE (PF) 100 MCG/2ML IJ SOLN
INTRAMUSCULAR | Status: AC
  Filled 2018-09-20: qty 2

## 2018-09-20 MED ORDER — FENTANYL CITRATE (PF) 100 MCG/2ML IJ SOLN: 100.0000 ug | INTRAMUSCULAR | Status: DC | PRN

## 2018-09-20 MED ORDER — SOD CITRATE-CITRIC ACID 500-334 MG/5ML PO SOLN
30.0000 mL | ORAL | Status: DC | PRN
  Administered 2018-09-20: 16:00:00 30 mL via ORAL
  Filled 2018-09-20: qty 30

## 2018-09-20 MED ORDER — SODIUM CHLORIDE 0.9 % IR SOLN
Status: DC | PRN
  Administered 2018-09-20: 1

## 2018-09-20 MED ORDER — HYDROMORPHONE HCL 1 MG/ML IJ SOLN: 0.2500 mg | INTRAMUSCULAR | Status: DC | PRN

## 2018-09-20 MED ORDER — LIDOCAINE HCL (PF) 1 % IJ SOLN: 30.0000 mL | INTRAMUSCULAR | Status: DC | PRN

## 2018-09-20 MED ORDER — STERILE WATER FOR IRRIGATION IR SOLN
Status: DC | PRN
  Administered 2018-09-20: 1

## 2018-09-20 MED ORDER — MISOPROSTOL 50MCG HALF TABLET: 50.0000 ug | ORAL_TABLET | ORAL | Status: DC | PRN

## 2018-09-20 MED ORDER — NALOXONE HCL 0.4 MG/ML IJ SOLN: 0.4000 mg | INTRAMUSCULAR | Status: DC | PRN

## 2018-09-20 MED ORDER — PRENATAL MULTIVITAMIN CH
1.0000 | ORAL_TABLET | Freq: Every day | ORAL | Status: DC
  Administered 2018-09-21 – 2018-09-23 (×3): 1 via ORAL
  Filled 2018-09-20 (×3): qty 1

## 2018-09-20 MED ORDER — TRANEXAMIC ACID-NACL 1000-0.7 MG/100ML-% IV SOLN
INTRAVENOUS | Status: DC | PRN
  Administered 2018-09-20: 1000 mg via INTRAVENOUS

## 2018-09-20 MED ORDER — MENTHOL 3 MG MT LOZG: 1.0000 | LOZENGE | OROMUCOSAL | Status: DC | PRN

## 2018-09-20 MED ORDER — TRANEXAMIC ACID-NACL 1000-0.7 MG/100ML-% IV SOLN
INTRAVENOUS | Status: AC
  Filled 2018-09-20: qty 100

## 2018-09-20 MED ORDER — LACTATED RINGERS IV SOLN: 500.0000 mL | Freq: Once | INTRAVENOUS | Status: DC

## 2018-09-20 MED ORDER — DEXAMETHASONE SODIUM PHOSPHATE 10 MG/ML IJ SOLN
INTRAMUSCULAR | Status: DC | PRN
  Administered 2018-09-20: 10 mg via INTRAVENOUS

## 2018-09-20 MED ORDER — METHYLERGONOVINE MALEATE 0.2 MG/ML IJ SOLN
INTRAMUSCULAR | Status: AC
  Filled 2018-09-20: qty 1

## 2018-09-20 MED ORDER — ONDANSETRON HCL 4 MG/2ML IJ SOLN
INTRAMUSCULAR | Status: AC
  Filled 2018-09-20: qty 2

## 2018-09-20 MED ORDER — OXYTOCIN 40 UNITS IN NORMAL SALINE INFUSION - SIMPLE MED: 2.5000 [IU]/h | INTRAVENOUS | Status: AC

## 2018-09-20 MED ORDER — INFLUENZA VAC SPLIT QUAD 0.5 ML IM SUSY: 0.5000 mL | PREFILLED_SYRINGE | INTRAMUSCULAR | Status: DC

## 2018-09-20 MED ORDER — KETOROLAC TROMETHAMINE 30 MG/ML IJ SOLN: 30.0000 mg | Freq: Four times a day (QID) | INTRAMUSCULAR | Status: DC

## 2018-09-20 MED ORDER — ONDANSETRON HCL 4 MG/2ML IJ SOLN
INTRAMUSCULAR | Status: DC | PRN
  Administered 2018-09-20: 4 mg via INTRAVENOUS

## 2018-09-20 MED ORDER — OXYTOCIN 40 UNITS IN NORMAL SALINE INFUSION - SIMPLE MED: 1.0000 m[IU]/min | INTRAVENOUS | Status: DC

## 2018-09-20 MED ORDER — SODIUM CHLORIDE 0.9 % IV SOLN
INTRAVENOUS | Status: DC | PRN
  Administered 2018-09-20: 17:00:00 via INTRAVENOUS

## 2018-09-20 MED ORDER — PHENYLEPHRINE HCL-NACL 20-0.9 MG/250ML-% IV SOLN
INTRAVENOUS | Status: DC | PRN
  Administered 2018-09-20: 60 ug/min via INTRAVENOUS

## 2018-09-20 MED ORDER — CEFAZOLIN SODIUM-DEXTROSE 2-4 GM/100ML-% IV SOLN
INTRAVENOUS | Status: AC
  Filled 2018-09-20: qty 100

## 2018-09-20 MED ORDER — DIBUCAINE (PERIANAL) 1 % EX OINT: 1.0000 "application " | TOPICAL_OINTMENT | CUTANEOUS | Status: DC | PRN

## 2018-09-20 MED ORDER — OXYCODONE HCL 5 MG/5ML PO SOLN: 5.0000 mg | Freq: Once | ORAL | Status: DC | PRN

## 2018-09-20 MED ORDER — LACTATED RINGERS IV SOLN
INTRAVENOUS | Status: DC
  Administered 2018-09-20 (×4): via INTRAVENOUS

## 2018-09-20 SURGICAL SUPPLY — 37 items
APL SKNCLS STERI-STRIP NONHPOA (GAUZE/BANDAGES/DRESSINGS) ×1
BENZOIN TINCTURE PRP APPL 2/3 (GAUZE/BANDAGES/DRESSINGS) ×3 IMPLANT
CHLORAPREP W/TINT 26ML (MISCELLANEOUS) ×3 IMPLANT
CLAMP CORD UMBIL (MISCELLANEOUS) IMPLANT
CLOSURE WOUND 1/2 X4 (GAUZE/BANDAGES/DRESSINGS) ×1
CLOTH BEACON ORANGE TIMEOUT ST (SAFETY) ×3 IMPLANT
DRSG OPSITE POSTOP 4X10 (GAUZE/BANDAGES/DRESSINGS) ×3 IMPLANT
ELECT REM PT RETURN 9FT ADLT (ELECTROSURGICAL) ×3
ELECTRODE REM PT RTRN 9FT ADLT (ELECTROSURGICAL) ×1 IMPLANT
EXTRACTOR VACUUM M CUP 4 TUBE (SUCTIONS) IMPLANT
EXTRACTOR VACUUM M CUP 4' TUBE (SUCTIONS)
GLOVE BIOGEL PI IND STRL 7.0 (GLOVE) ×2 IMPLANT
GLOVE BIOGEL PI IND STRL 7.5 (GLOVE) ×2 IMPLANT
GLOVE BIOGEL PI INDICATOR 7.0 (GLOVE) ×4
GLOVE BIOGEL PI INDICATOR 7.5 (GLOVE) ×4
GLOVE ECLIPSE 7.5 STRL STRAW (GLOVE) ×3 IMPLANT
GOWN STRL REUS W/TWL LRG LVL3 (GOWN DISPOSABLE) ×9 IMPLANT
KIT ABG SYR 3ML LUER SLIP (SYRINGE) IMPLANT
NDL HYPO 25X5/8 SAFETYGLIDE (NEEDLE) IMPLANT
NEEDLE HYPO 25X5/8 SAFETYGLIDE (NEEDLE) IMPLANT
NS IRRIG 1000ML POUR BTL (IV SOLUTION) ×3 IMPLANT
PACK C SECTION WH (CUSTOM PROCEDURE TRAY) ×3 IMPLANT
PAD ABD 7.5X8 STRL (GAUZE/BANDAGES/DRESSINGS) ×2 IMPLANT
PAD OB MATERNITY 4.3X12.25 (PERSONAL CARE ITEMS) ×3 IMPLANT
PENCIL SMOKE EVAC W/HOLSTER (ELECTROSURGICAL) ×3 IMPLANT
RTRCTR C-SECT PINK 25CM LRG (MISCELLANEOUS) ×3 IMPLANT
SPONGE GAUZE 4X4 12PLY STER LF (GAUZE/BANDAGES/DRESSINGS) ×4 IMPLANT
STRIP CLOSURE SKIN 1/2X4 (GAUZE/BANDAGES/DRESSINGS) ×2 IMPLANT
SUT VIC AB 0 CT1 36 (SUTURE) ×3 IMPLANT
SUT VIC AB 0 CTX 36 (SUTURE) ×6
SUT VIC AB 0 CTX36XBRD ANBCTRL (SUTURE) ×2 IMPLANT
SUT VIC AB 2-0 CT1 27 (SUTURE) ×3
SUT VIC AB 2-0 CT1 TAPERPNT 27 (SUTURE) ×1 IMPLANT
SUT VIC AB 4-0 KS 27 (SUTURE) ×3 IMPLANT
TOWEL OR 17X24 6PK STRL BLUE (TOWEL DISPOSABLE) ×3 IMPLANT
TRAY FOLEY W/BAG SLVR 14FR LF (SET/KITS/TRAYS/PACK) ×3 IMPLANT
WATER STERILE IRR 1000ML POUR (IV SOLUTION) ×3 IMPLANT

## 2018-09-20 NOTE — Progress Notes (Signed)
Melinda Tayloris a 33 y.o.G1P0 at [redacted]w[redacted]d admitted for rupture of membranes,early labor  Subjective: Feeling painful contractions   Objective: BP 128/75   Pulse (!) 104   Temp (P) 98.8 F (37.1 C) (Oral)   Resp (P) 20   Ht 5\' 2"  (1.575 m)   Wt 70.9 kg   LMP 12/25/2017   SpO2 97%   BMI 28.57 kg/m  No intake/output data recorded. No intake/output data recorded.  FHT:  FHR: 145 bpm, variability: moderate,  accelerations:  Present,  decelerations:  Present variable decels  UC:   regular, every 2-6 minutes SVE:   Dilation: 7.5 Effacement (%): 70 Station: 0 Exam by:: Jaquese Irving/sowder  Labs: Lab Results  Component Value Date   WBC 14.4 (H) 09/20/2018   HGB 10.3 (L) 09/20/2018   HCT 32.4 (L) 09/20/2018   MCV 90.5 09/20/2018   PLT 109 (L) 09/20/2018    Assessment / Plan: Melinda Tayloris a 33 y.o.G1P0 at [redacted]w[redacted]d admitted for rupture of membranes,early labor  Labor: Not progressing adequately. Uterine contractions not frequent enough. Inserted IUPC at 12:30. Started pitocin at 12:30. Monitor uterine contractions. Fetal Wellbeing:  Category II closely monitoring fetal HR. May need amnioinfusion if baby has recurrent decels Pain Control:  Labor support without medications I/D:  n/a Anticipated MOD:  NSVD, progress to csection if maternal/fetal indication  Lattie Haw MD 09/20/2018, 1:13 PM

## 2018-09-20 NOTE — Progress Notes (Signed)
Kensey Tayloris a 33 y.o.G1P0 at [redacted]w[redacted]d admitted for rupture of membranes,early labor  Subjective: Feeling contractions and pressure  Objective: BP 125/71   Pulse (!) 106   Temp 98.8 F (37.1 C) (Oral)   Resp 18   Ht 5\' 2"  (1.575 m)   Wt 70.9 kg   LMP 12/25/2017   SpO2 97%   BMI 28.57 kg/m  No intake/output data recorded. No intake/output data recorded.  FHT:  FHR: 145 bpm, variability: moderate,  accelerations:  Present,  decelerations:  Present variable decels present UC:   regular, every 2-6 minutes SVE:   Dilation: 8 Effacement (%): 70 Station: 0 Exam by:: Ezariah Nace  Labs: Lab Results  Component Value Date   WBC 14.4 (H) 09/20/2018   HGB 10.3 (L) 09/20/2018   HCT 32.4 (L) 09/20/2018   MCV 90.5 09/20/2018   PLT 109 (L) 09/20/2018    Assessment / Plan: Antha Tayloris a 33 y.o.G1P0 at [redacted]w[redacted]d admitted for rupture of membranes,early labor  Labor: Progressing normally some variable decels. Not enough for IUP placement or start amnioinfusion. Continue current care Fetal Wellbeing:  Category II closely monitoring fetal HR  Pain Control:  Labor support without medications I/D:  n/a gbs negative  Anticipated MOD:  NSVD, progress to csection if maternal/fetal indication   Lattie Haw MD  09/20/2018, 10:29 AM

## 2018-09-20 NOTE — Anesthesia Procedure Notes (Signed)
Spinal  Patient location during procedure: OR Start time: 09/20/2018 4:10 PM End time: 09/20/2018 4:20 PM Staffing Anesthesiologist: Pervis Hocking, DO Performed: anesthesiologist  Preanesthetic Checklist Completed: patient identified, site marked, surgical consent, pre-op evaluation, timeout performed, IV checked, risks and benefits discussed and monitors and equipment checked Spinal Block Patient position: sitting Prep: site prepped and draped and DuraPrep Patient monitoring: heart rate, cardiac monitor, continuous pulse ox and blood pressure Approach: midline Location: L3-4 Injection technique: single-shot Needle Needle type: Sprotte  Needle gauge: 24 G Needle length: 9 cm Assessment Sensory level: T4 Additional Notes Functioning IV was confirmed and monitors were applied. Sterile prep and drape, including hand hygiene and sterile gloves were used. The patient was positioned and the spine was prepped. The skin was anesthetized with lidocaine.  Free flow of clear CSF was obtained prior to injecting local anesthetic into the CSF.  The spinal needle aspirated freely following injection.  The needle was carefully withdrawn.  The patient tolerated the procedure well.

## 2018-09-20 NOTE — Lactation Note (Signed)
This note was copied from a baby's chart. Lactation Consultation Note  Patient Name: Melinda [name] [id number] Date: 09/20/2018 Reason for consult: Initial assessment   P1 mom.  Infant swaddled in blankets cueing when [place] entered room.  Mom states infant fed well earlier for 10 minutes on one side.  LC offered assistance with feeding on the other side.  Mom accepted offer.  [employer] worked with mom to hand exp.  Drops seen on nipple.  Mom was very excited.    [place] unswaddled infant and placed infant STS.  Infant became gagging.  LC patted back and infant began crying.  Small amount of clear mucous suctioned from corner of mouth.  LC reviewed bulb use with mom and grandmother.  Upon observation of oral anatomy, LC noticed infant gums were had raised with white bumps over top and bottom gumlines.       Infant placed STS.  LC reviewed bf basics.  Mom was told to bf at least 8-12 times in 24 hours, stomach size of infant reviewed, and importance of hand expression.  When STS, infant had a bm, LC changed diaper and mom requested for infant to be swaddled.    Infant swaddled and put in bassinet.  LC encouraged mom to call out for assistance with next feed.  Lactation brochure and services reviewed with mom.     Maternal Data Has patient been taught Hand Expression?: Yes Does the patient have breastfeeding experience prior to this delivery?: No  Feeding Feeding Type: Breast Fed  LATCH Score Latch: Repeated attempts needed to sustain latch, nipple held in mouth throughout feeding, stimulation needed to elicit sucking reflex.  Audible Swallowing: A few with stimulation  Type of Nipple: Everted at rest and after stimulation  Comfort (Breast/Nipple): Soft / non-tender  Hold (Positioning): Assistance needed to correctly position infant at breast and maintain latch.  LATCH Score: 7  Interventions Interventions: Breast feeding basics reviewed;Skin to skin;Hand express  Lactation Tools Discussed/Used     Consult Status Consult Status: Follow-up Date: 09/21/18 Follow-up type: In-patient    Ferne Coe [facility] 09/20/2018, 10:44 PM

## 2018-09-20 NOTE — Progress Notes (Signed)
Patient ID: Melinda [name], female   DOB: May 24, 1985, 33 y.o.   MRN: [id number] Melinda [name] is a 33 y.o. G1P0 at [redacted]w[redacted]d admitted for rupture of membranes, early labor  Subjective: Doing well  Objective: BP 122/77   Pulse (!) 115   Temp 98.8 F (37.1 C) (Oral)   Resp 18   Ht 5\' 2"  (1.575 m)   Wt 70.9 kg   LMP 12/25/2017   SpO2 97%   BMI 28.57 kg/m  No intake/output data recorded.  FHT:  FHR: 150 bpm, variability: moderate,  accelerations:  Present,  decelerations:  Absent UC:   q 2-29mins  SVE:   Dilation: 7 Effacement (%): 80 Station: -3 Exam by:: s seagraves rn   Labs: Lab Results  Component Value Date   WBC 14.4 (H) 09/20/2018   HGB 10.3 (L) 09/20/2018   HCT 32.4 (L) 09/20/2018   MCV 90.5 09/20/2018   PLT 109 (L) 09/20/2018    Assessment / Plan: Spontaneous labor, progressing normally  Labor: Progressing normally Fetal Wellbeing:  Category I Pain Control:  Labor support without medications Pre-eclampsia: n/a I/D:  GBS neg Anticipated MOD:  NSVD  Roma Schanz CNM, WHNP-BC 09/20/2018, 5:42 AM

## 2018-09-20 NOTE — MAU Note (Signed)
PT SAYS UC'S STRONG SINCE 0030. [place].  VE LAST WEEK - CLOSED .   DENIES  ANY S/S OF AN OUTBREAK OF HSV   LAST OUTBREAK  WAS 5 YEARS AGO.  TAKES  VALTREX . DENIES MRSA.  GBS- NEG

## 2018-09-20 NOTE — Transfer of Care (Signed)
Immediate Anesthesia Transfer of Care Note  Patient: Melinda [name]  Procedure(s) Performed: CESAREAN SECTION (N/A Abdomen)  Patient Location: PACU  Anesthesia Type:Spinal  Level of Consciousness: awake  Airway & Oxygen Therapy: Patient Spontanous Breathing  Post-op Assessment: Report given to RN  Post vital signs: Reviewed and stable  Last Vitals:  Vitals Value Taken Time  BP 145/117 09/20/18 1741  Temp    Pulse 102 09/20/18 1742  Resp 6 09/20/18 1742  SpO2 100 % 09/20/18 1742  Vitals shown include unvalidated device data.  Last Pain:  Vitals:   09/20/18 1533  TempSrc:   PainSc: 0-No pain         Complications: No apparent anesthesia complications

## 2018-09-20 NOTE — Op Note (Signed)
Cesarean Section Operative Report  Melinda [name]  09/20/2018  Indications: NRFHT   Pre-operative Diagnosis: fetal intolerance to labor.   Post-operative Diagnosis: Same   Surgeon: Surgeon(s) and Role:    * Wouk, Ailene Rud, MD - Primary    * Dione Plover, Annice Needy, MD - Fellow   Assistants: Lattie Haw, MD - Resident  Anesthesia: spinal    Estimated Blood Loss: 965 ml  Total IV Fluids: 1300 ml LR  Urine Output:: 400 ml clear urine  Specimens: placenta to pathology  Findings: Viable female infant in cephalic presentation; Apgars 8 and 9; weight pending;  clear amniotic fluid; intact placenta with three vessel cord; normal uterus, fallopian tubes and ovaries bilaterally.  Baby condition / location:  Couplet care / Skin to Skin   Complications: no complications  Indications: Melinda [name] is a 33 y.o. G1P1001 with an IUP [redacted]w[redacted]d presenting in Latent Labor on 09/20/2018. Patient had a labor course significant for: patient arrived at 5cm after SROM at home for clear fluid. She was managed expectantly and progressed to 7 cm, but then had AROM of a forebag. Over the course of several hours her cervix remained unchanged and pitocin was initiated. After a short interval patient developed recurrent deep prolonged variable decelerations. An amnioinfusion was started, she was given terbutaline, and a bolus was given, however her tracing continued to have repeated decelerations and her cervix remained unchanged, at which point she was offered and accepted a primary cesarean for NRFHT.  The risks, benefits, complications, treatment options, and exected outcomes were discussed with the patient . The patient dwith the proposed plan, giving informed consent. identified as Melinda [name] and the procedure verified as C-Section Delivery.  Procedure Details:  The patient was taken back to the operative suite where spinal anesthesia was placed.  A time out was held and the above information confirmed.    After induction of anesthesia, the patient was draped and prepped in the usual sterile manner and placed in a dorsal supine position with a leftward tilt. A Pfannenstiel incision was made and carried down through the subcutaneous tissue to the fascia. Fascial incision was made and sharply extended transversely. The fascia was separated from the underlying rectus tissue superiorly and inferiorly. The peritoneum was identified and bluntly entered and extended longitudinally. An Alexis retractor was placed. A low transverse uterine incision was made and extended bluntly. Delivered from cephalic presentation was a viable infant with Apgars and weight as above.  After waiting 60 seconds for delayed cord cutting, the umbilical cord was clamped and cut cord blood was obtained for evaluation. Cord ph was not sent. The placenta was removed Intact and appeared normal. The uterine outline, tubes and ovaries appeared normal. The uterine incision was closed with running locked sutures of 0Vicryl with an imbricating layer of the same.   Hemostasis was observed. The peritoneum was closed with 2-0 Vicryl. The rectus muscles were examined and hemostasis observed. The fascia was then reapproximated with running sutures of 0Vicryl. The skin was closed with 4-0Vicryl.   Instrument, sponge, and needle counts were correct prior the abdominal closure and were correct at the conclusion of the case.     Disposition: PACU - hemodynamically stable.   Maternal Condition: stable       Signed: Annice Needy [facility] 09/20/2018 6:01 PM

## 2018-09-20 NOTE — Progress Notes (Addendum)
Spoke w/ and evaluated patient. 33 yo G1 @ 39+6, preg c/b hsv no lesions on valtres, itp with plts above 100, here w/ sol. Ruptured now for about 15 hours. Labor progressed to about 7 cm and has been there for at least 10 hours. S/p pitocin which was discontinued for recurrent prolonged late decelerations. Has had terbutaline as well. Has been off pit for about 1.5 hours with persistent category 2 strip (tachycardia, intermittent variable and late decels). Discussed with patient risks/benefits if expectant mgmt versus proceeding to cesarean section and patient desires cesarean section. Anesthesiology ok with proceeding with spinal. Will order ancef/azithromcin. Afebrile, no uterine tenderness or foul smelling lochia - holding on triple I antibiotics.   The risks of cesarean section were discussed with the patient including but were not limited to: bleeding which may require transfusion or reoperation; infection which may require antibiotics; injury to bowel, bladder, ureters or other surrounding organs; injury to the fetus; need for additional procedures including hysterectomy in the event of a life-threatening hemorrhage; placental abnormalities wth subsequent pregnancies, incisional problems, thromboembolic phenomenon and other postoperative/anesthesia complications.

## 2018-09-20 NOTE — Progress Notes (Signed)
LABOR PROGRESS NOTE  Melinda [name] is a 33 y.o. G1P0 at [redacted]w[redacted]d  admitted for spontaneous labor.  Subjective: Feeling better now that contractions have spaced  Objective: BP (!) 130/96   Pulse (!) 200   Temp 98.5 F (36.9 C) (Oral)   Resp 20   Ht 5\' 2"  (1.575 m)   Wt 70.9 kg   LMP 12/25/2017   SpO2 100%   BMI 28.57 kg/m  or  Vitals:   09/20/18 1442 09/20/18 1456 09/20/18 1501 09/20/18 1505  BP: (!) 114/58   (!) 130/96  Pulse: 98   (!) 200  Resp:  20    Temp:  98.5 F (36.9 C)    TempSrc:  Oral    SpO2:  99% 100%   Weight:      Height:         Dilation: 7 Effacement (%): 90 Cervical Position: Middle Station: -2 Presentation: Vertex Exam by:: Recie Cirrincione FHT: baseline rate 165, moderate varibility, +acel, variable/prolonged decel Toco: weakly q3-5 min  Labs: Lab Results  Component Value Date   WBC 14.4 (H) 09/20/2018   HGB 10.3 (L) 09/20/2018   HCT 32.4 (L) 09/20/2018   MCV 90.5 09/20/2018   PLT 109 (L) 09/20/2018    Patient Active Problem List   Diagnosis Date Noted  . Normal labor 09/20/2018  . Post-dates pregnancy 09/20/2018  . Herpes simplex infection in mother during third trimester of pregnancy 08/31/2018  . Uterine size date discrepancy pregnancy, third trimester 08/24/2018  . Thrombocytopenia affecting pregnancy ([place]) 06/17/2018  . Supervision of normal first pregnancy, antepartum 06/15/2018  . Herpes simplex 09/11/2013  . 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 Weight loss, non-intentional   . Early satiety   . Night sweat   . Thrombocytopenia ([place])   . Anemia   . Iron deficiency anemia   . CHICKENPOX, HX OF 04/10/2007    Assessment / Plan: 33 y.o. G1P0 at [redacted]w[redacted]d here for spontaneous labor.  Labor: no progression since 5 AM despite AROM. Attempted to augment with pitocin however tracing developed recurrent deep, prolonged variables and began to lose variability prompting amnioinfusion, 500cc bolus for patient, and terbutaline. Discussion had with  patient at bedside with Dr. Si Raider and myself, reviewed options for proceeding to cesarean immediately given persistently Cat II tracing vs allowing baby to recover and placing epidural so that cesarean could be performed quickly in event of baby not tolerating restarting labor. At present she wishes to get epidural and continue to attempt epidural, will restart pitocin once epidural is in place and monitor closely.  Fetal Wellbeing:  Cat II for mildly increased baseline/fetal tachycardia, however tracing has good variability and accels which are reassuring, cont to monitor.  Pain Control:  Planning epidural Anticipated MOD:  SVD  Augustin Coupe, MD/[facility] OB Fellow  09/20/2018, 3:44 PM

## 2018-09-20 NOTE — Anesthesia Preprocedure Evaluation (Signed)
Anesthesia Evaluation  Patient identified by MRN, date of birth, ID band Patient awake    Reviewed: Allergy & Precautions, NPO status , Patient's Chart, lab work & pertinent test results  Airway Mallampati: II  TM Distance: >3 FB Neck ROM: Full    Dental no notable dental hx. (+) Dental Advisory Given   Pulmonary neg pulmonary ROS,    Pulmonary exam normal breath sounds clear to auscultation       Cardiovascular negative cardio ROS Normal cardiovascular exam Rhythm:Regular Rate:Normal     Neuro/Psych negative neurological ROS  negative psych ROS   GI/Hepatic negative GI ROS, Neg liver ROS,   Endo/Other  negative endocrine ROS  Renal/GU negative Renal ROS  negative genitourinary   Musculoskeletal negative musculoskeletal ROS (+)   Abdominal   Peds negative pediatric ROS (+)  Hematology  (+) anemia , ITP previously on steroids with good response, plt 109 this morning   Anesthesia Other Findings Category 2 fetal tracing all day, failure to progress  Reproductive/Obstetrics (+) Pregnancy                             Anesthesia Physical Anesthesia Plan  ASA: III  Anesthesia Plan: Spinal   Post-op Pain Management:    Induction:   PONV Risk Score and Plan: 2 and Ondansetron and Dexamethasone  Airway Management Planned: Nasal Cannula and Natural Airway  Additional Equipment: None  Intra-op Plan:   Post-operative Plan:   Informed Consent: I have reviewed the patients History and Physical, chart, labs and discussed the procedure including the risks, benefits and alternatives for the proposed anesthesia with the patient or authorized representative who has indicated his/her understanding and acceptance.       Plan Discussed with: CRNA  Anesthesia Plan Comments:         Anesthesia Quick Evaluation

## 2018-09-20 NOTE — Progress Notes (Signed)
Patient ID: Melinda [name], female   DOB: 1985/02/12, 33 y.o.   MRN: [id number] Melinda [name] is a 33 y.o. G1P0 at [redacted]w[redacted]d admitted for rupture of membranes, early labor  Subjective: Doing well, uc's increasing in intensity, feeling some pressure  Objective: BP 115/61   Pulse (!) 106   Temp 98.5 F (36.9 C)   Resp 20   Ht 5\' 2"  (1.575 m)   Wt 70.9 kg   LMP 12/25/2017   SpO2 97%   BMI 28.57 kg/m  No intake/output data recorded.  FHT:  FHR: 140 bpm, variability: moderate,  accelerations:  Present,  decelerations:  Absent UC:   q 2-52mins  SVE:   Dilation: 7 Effacement (%): 80 Station: -2 Exam by:: kbooker, cnm  AROM bulging forebag, light MSF  Labs: Lab Results  Component Value Date   WBC 14.4 (H) 09/20/2018   HGB 10.3 (L) 09/20/2018   HCT 32.4 (L) 09/20/2018   MCV 90.5 09/20/2018   PLT 109 (L) 09/20/2018    Assessment / Plan: Spontaneous labor, progressing normally, forebag now AROM'd  Labor: Progressing normally Fetal Wellbeing:  Category I Pain Control:  Labor support without medications Pre-eclampsia: n/a I/D:  GBS neg Anticipated MOD:  NSVD  Roma Schanz CNM, WHNP-BC 09/20/2018, 7:29 AM

## 2018-09-20 NOTE — Discharge Summary (Signed)
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 yo G1P1001 at 354w6das admitted in Latent Labor on 09/20/2018. Patient had a labor course significant for: patient arrived at 5cm after SROM at home for clear fluid. She was managed expectantly and progressed to 7 cm, but then had AROM of a forebag. Over the course of several hours her cervix remained unchanged and pitocin was initiated. After a short interval patient developed recurrent deep prolonged variable decelerations. An amnioinfusion was started, she was given terbutaline, and a bolus was given, however her tracing continued to have repeated decelerations and her cervix remained unchanged, at which point she was offered and accepted a primary cesarean for NRFHT. Membrane Rupture Time/Date: 2:30 AM ,09/20/2018   The patient went for cesarean section due to Non-Reassuring FHR, and delivered a Viable infant,09/20/2018  Details of operation can be found in separate operative note. Patient had an uncomplicated postpartum course.  She is ambulating,tolerating a regular diet, passing flatus, and urinating well.  Patient is discharged home in stable condition 09/23/18. Delivery time: 4:46 PM    Magnesium Sulfate  received: No BMZ received: No Rhophylac:N/A MMR:N/A Transfusion:No  Physical exam  Vitals:   09/22/18 0557 09/22/18 1415 09/22/18 2215 09/23/18 0655  BP: 122/84 118/82 111/75 124/87  Pulse: 89 88 63 79  Resp: _0 Temp: 98.6 F (37 C) 98.4 F (36.9 C)  98.4 F (36.9 C)  TempSrc: Oral Oral  Oral  SpO2: 100%   100%  Weight:      Height:       General: alert, cooperative and no distress Lochia: appropriate Uterine Fundus: firm Incision: dressing is clean and intact, but middle portion of dressing has reddish-brown blood, no signs of infection. MeJulianne HandlerCNM, will check incision again immediately prior to discharge. DVT Evaluation: No evidence of DVT seen on physical exam. No cords or calf tenderness. No significant calf/ankle edema. Labs: Per Dr. DaRosana Hoespt OK ot be discharged home with platelets of 80, will repeat platelets at C/S incision check in 1-2weeks. Lab Results  Component Value Date   WBC 6.6 09/23/2018   HGB 9.2 (L) 09/23/2018   HCT 28.9 (L) 09/23/2018   MCV 90.9 09/23/2018   PLT 80 (L) 09/23/2018   CMP Latest Ref Rng & Units 09/19/2018  Glucose 70 -  99 mg/dL 103(H)  BUN 6 - 20 mg/dL 8  Creatinine 0.44 - 1.00 mg/dL 0.75  Sodium 135 - 145 mmol/L 138  Potassium 3.5 - 5.1 mmol/L 3.6  Chloride 98 - 111 mmol/L 108  CO2 22 - 32 mmol/L 21(L)  Calcium 8.9 - 10.3 mg/dL 8.8(L)  Total Protein 6.5 - 8.1 g/dL 6.2(L)  Total Bilirubin 0.3 - 1.2 mg/dL 0.4  Alkaline Phos 38 - 126 U/L 138(H)  AST 15 - 41 U/L 17  ALT 0 - 44 U/L 28    Discharge instruction: per After Visit Summary and "Baby and Me Booklet".  After visit meds:  Allergies as of 09/23/2018   No Known Allergies     Medication List    STOP taking these medications   Comfort Fit Maternity Supp Med Misc   Magnesium 200 MG Tabs   predniSONE 20 MG tablet Commonly known as: DELTASONE     TAKE these medications   acetaminophen 325 MG tablet Commonly known as: TYLENOL Take 2 tablets (650  mg total) by mouth every 4 (four) hours as needed for up to 30 doses for mild pain.   ferrous sulfate 325 (65 FE) MG tablet Take 1 tablet (325 mg total) by mouth daily.   One-A-Day Womens Prenatal 1 28-0.8-235 MG Caps Take 1 capsule by mouth daily.   oxyCODONE-acetaminophen 5-325 MG tablet Commonly known as: Percocet Take 1-2 tablets by mouth every 6 (six) hours as needed for up to 8 doses for severe pain.   senna-docusate 8.6-50 MG tablet Commonly known as: Senokot-S Take 2 tablets by mouth daily. Start taking on: September 24, 2018   valACYclovir 500 MG tablet Commonly known as: Valtrex Take 1 tablet (500 mg total) by mouth 2 (two) times daily.       Diet: routine diet  Activity: Advance as tolerated. Pelvic rest for 6 weeks.   Outpatient follow up:2 weeks Follow up Appt: Future Appointments  Date Time Provider [place]  09/26/2018  8:40 AM [facility] 1 [facility] None  10/02/2018  9:15 AM [place]  10/06/2018  9:30 AM Jorje Guild, NP [facility] None  10/09/2018 10:30 AM [facility] LAB 4 [facility] None  10/09/2018 11:00 AM Heath Lark, MD [facility] None  10/13/2018  2:00 PM Sueanne Margarita, MD [facility]  11/02/2018 10:10 AM Laury Deep, CNM [facility] None   Follow up Visit: Follow-up Information    [place] Follow up.   Specialty: Obstetrics and Gynecology Why: Needs visit with MD in 1-2weeks for incision check and repeat CBC to check platelets. Needs visit in 4-6weeks for regular postpartum visit with MD. Contact information: [place] [phone]          Please schedule this patient for Postpartum visit in: 2 weeks with the following provider: MD needs platelets checked at this visit For C/S patients schedule nurse incision check in weeks 2 weeks: yes  High risk pregnancy complicated by: ITP on prednisone  Delivery mode: CS   Anticipated Birth Control: POPs  PP Procedures needed: Incision check  Schedule Integrated BH visit: yes     Newborn Data: Live born female  Birth Weight:   APGAR: ,   Newborn Delivery   Birth date/time: 09/20/2018 16:46:00 Delivery type: C-Section, Low Transverse Trial of labor: Yes C-section categorization: Primary      Baby Feeding: Bottle and Breast Disposition:home with mother  Julianne Handler, CNM to check patient's incision immediately prior to  discharge and to enter discharge order for patient, if incision appears normal.  09/23/2018 Clarisa Fling, NP

## 2018-09-20 NOTE — Progress Notes (Signed)
Melinda [name] is a 33 y.o. G1P0 at [redacted]w[redacted]d admitted for rupture of membranes, early labor  Subjective: Doing well, feeling contractions. Declined epidural at present.   Objective: BP 116/76   Pulse (!) 101   Temp 98.5 F (36.9 C)   Resp 18   Ht 5\' 2"  (1.575 m)   Wt 70.9 kg   LMP 12/25/2017   SpO2 97%   BMI 28.57 kg/m  No intake/output data recorded. No intake/output data recorded.  FHT:  FHR: 140 bpm, variability: moderate,  accelerations:  Present,  decelerations:  Present some variable decels  UC:   regular, every 1.5-3 minutes SVE:   Dilation: 7 Effacement (%): 80 Station: -2 Exam by:: kbooker, cnm  Labs: Lab Results  Component Value Date   WBC 14.4 (H) 09/20/2018   HGB 10.3 (L) 09/20/2018   HCT 32.4 (L) 09/20/2018   MCV 90.5 09/20/2018   PLT 109 (L) 09/20/2018    Assessment / Plan: Melinda [name] is a 33 y.o. G1P0 at [redacted]w[redacted]d admitted for rupture of membranes, early labor  Labor: Spontaneous labor, progressing normal. S/p forebag AROM. Continue current plan  Fetal Wellbeing:  Category II closely monitoring fetal HR Pain Control:  Labor support without medications I/D:  n/a Anticipated MOD:  NSVD anticipated. Progress to c-section if maternal/fetal indication   Lattie Haw MD PGY-1, [facility]  09/20/2018, 8:52 AM

## 2018-09-20 NOTE — H&P (Signed)
Melinda [name] is a 33 y.o. G1P0 female at [redacted]w[redacted]d by 26wk u/s, presenting w/ ROM celar fluid @ 0230 and early labor.   Reports active fetal movement, contractions: regular all night, tried to sleep through them but continued getting worse, vaginal bleeding: small amt show, membranes: ruptured.  Initiated prenatal care at [place] at 28.1 wks.   Most recent u/s 8/19 @ 36.6wks, EFW 3697g/8lb2oz/97%.   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 Anemia   . Chickenpox   . Early satiety   . Herpes genitalia   . HPV (human papilloma virus) anogenital infection   . Iron deficiency anemia   . Night sweat   . Scarring, keloid    external genitalia  . Thrombocytopenia ([place])   . Vaginal Pap smear, abnormal   . Weight loss, non-intentional     Past Surgical History: Past Surgical History:  Procedure Laterality Date  . WISDOM TOOTH EXTRACTION      Obstetrical History: OB History    Gravida  1   Para      Term      Preterm      AB      Living        SAB      TAB      Ectopic      Multiple      Live Births              Social History: Social History   Socioeconomic History  . Marital status: Single    Spouse name: Not on file  . Number of children: 0  . Years of education: Not on file  . 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 Financial resource strain: Somewhat hard  . Food insecurity     Worry: Sometimes true    Inability: Sometimes true  . Transportation needs    Medical: No    Non-medical: No  Tobacco Use  . Smoking status: Never Smoker  . Smokeless tobacco: Never Used  Substance and Sexual Activity  . Alcohol use: No    Alcohol/week: 0.0 standard drinks  . Drug use: No  . Sexual activity: Not Currently    Birth control/protection: None  Lifestyle  . Physical activity    Days per week: 0 days    Minutes per session: 0 min  . Stress: To some extent  Relationships  . Social Herbalist on phone: Once a week    Gets together: Once a week    Attends religious service: More than 4 times per year    Active member of club or organization: No    Attends meetings of clubs or organizations: Never    Relationship status: Never married  Other Topics Concern  . Not on file  Social History Narrative  . Not on file    Family History: Family History  Problem Relation Age of Onset  . Hypertension Maternal Grandmother   .  Diabetes Maternal Grandmother   . Hypertension Mother   . Other Father        Leukopenia.  . Hypertension Maternal Uncle     Allergies: No Known Allergies  Medications Prior to Admission  Medication Sig Dispense Refill Last Dose  . Elastic Bandages & Supports (COMFORT FIT MATERNITY SUPP MED) MISC 1 Device by Does not apply route daily. 1 each 0   . Magnesium 200 MG TABS Take 2 tablets (400 mg total) by mouth at bedtime. 60 tablet 2   . Prenat-Fe Carbonyl-FA-Omega 3 (ONE-A-DAY WOMENS PRENATAL 1) 28-0.8-235 MG CAPS Take 1 capsule by mouth daily. 30 capsule 0   . valACYclovir (VALTREX) 500 MG tablet Take 1 tablet (500 mg total) by mouth 2 (two) times daily. 60 tablet 1     Review of Systems  Pertinent pos/neg as indicated in HPI  Blood pressure 122/77, pulse (!) 115, temperature 98.8 F (37.1 C), temperature source Oral, resp. rate 18, height 5\' 2"  (1.575 m), weight 70.9 kg, last menstrual period 12/25/2017, SpO2 97 %. General  appearance: alert, cooperative and no distress Lungs: clear to auscultation bilaterally Heart: regular rate and rhythm Abdomen: gravid, soft, non-tender Extremities: tr edema DTR's 2+  Fetal monitoring: FHR: 150 bpm, variability: moderate,  Accelerations: Present,  decelerations:  Absent Uterine activity: q 2-674mins Dilation: 5 Effacement (%): 60, 70 Station: -2 Exam by:: Adah Perlhandra Phillips RN Presentation: cephalic  [place] ~9lbs   Prenatal labs: ABO, Rh: --/--/O POS, O POS Performed at [facility], [address] [place], [place] [address]  [id number](09/09 0240) Antibody: NEG (09/09 0240) Rubella: 1.96 (06/05 0817) RPR: Non Reactive (06/05 0817)  HBsAg: Negative (06/05 0817)  HIV: Non Reactive (06/05 0817)  GBS:   Neg 2hr GTT: 72/83/73  Prenatal Transfer Tool  Maternal Diabetes: No Genetic Screening: normal NIPS, indeterminate allele for Fragile X Maternal Ultrasounds/Referrals: Normal Fetal Ultrasounds or other Referrals:  None Maternal Substance Abuse:  No Significant Maternal Medications:  Meds include: Other: valtrex Significant Maternal Lab Results: Group B Strep negative  Results for orders placed or performed during the hospital encounter of 09/20/18 (from the past 24 hour(s))  CBC   Collection Time: 09/20/18  2:40 AM  Result Value Ref Range   WBC 14.4 (H) 4.0 - 10.5 K/uL   RBC 3.58 (L) 3.87 - 5.11 MIL/uL   Hemoglobin 10.3 (L) 12.0 - 15.0 g/dL   HCT 52.832.4 (L) 41.336.0 - 24.446.0 %   MCV 90.5 80.0 - 100.0 fL   MCH 28.8 26.0 - 34.0 pg   MCHC 31.8 30.0 - 36.0 g/dL   RDW 01.015.9 (H) 27.211.5 - 53.615.5 %   Platelets 109 (L) 150 - 400 K/uL   nRBC 0.0 0.0 - 0.2 %  Type and screen [facility]   Collection Time: 09/20/18  2:40 AM  Result Value Ref Range   ABO/RH(D) O POS    Antibody Screen NEG    Sample Expiration      09/23/2018,2359 Performed at [facility], [address] [place], [place] [address]   ABO/Rh   Collection Time: 09/20/18  2:40 AM   Result Value Ref Range   ABO/RH(D)      O POS Performed at [facility], [address] [place], [place] [address]   POCT fern test   Collection Time: 09/20/18  2:48 AM  Result Value Ref Range   POCT Fern Test Positive = ruptured amniotic membanes   Results for orders placed  or performed in visit on 09/19/18 (from the past 24 hour(s))  CBC with Differential/Platelet   Collection Time: 09/19/18 10:30 AM  Result Value Ref Range   WBC 13.4 (H) 4.0 - 10.5 K/uL   RBC 3.59 (L) 3.87 - 5.11 MIL/uL   Hemoglobin 10.1 (L) 12.0 - 15.0 g/dL   HCT 60.6 (L) 30.1 - 60.1 %   MCV 89.7 80.0 - 100.0 fL   MCH 28.1 26.0 - 34.0 pg   MCHC 31.4 30.0 - 36.0 g/dL   RDW 09.3 (H) 23.5 - 57.3 %   Platelets 116 (L) 150 - 400 K/uL   nRBC 0.0 0.0 - 0.2 %   Neutrophils Relative % 74 %   Neutro Abs 10.0 (H) 1.7 - 7.7 K/uL   Lymphocytes Relative 15 %   Lymphs Abs 1.9 0.7 - 4.0 K/uL   Monocytes Relative 6 %   Monocytes Absolute 0.8 0.1 - 1.0 K/uL   Eosinophils Relative 0 %   Eosinophils Absolute 0.0 0.0 - 0.5 K/uL   Basophils Relative 0 %   Basophils Absolute 0.0 0.0 - 0.1 K/uL   Immature Granulocytes 5 %   Abs Immature Granulocytes 0.65 (H) 0.00 - 0.07 K/uL  Comprehensive metabolic panel   Collection Time: 09/19/18 10:30 AM  Result Value Ref Range   Sodium 138 135 - 145 mmol/L   Potassium 3.6 3.5 - 5.1 mmol/L   Chloride 108 98 - 111 mmol/L   CO2 21 (L) 22 - 32 mmol/L   Glucose, Bld 103 (H) 70 - 99 mg/dL   BUN 8 6 - 20 mg/dL   Creatinine, Ser 2.20 0.44 - 1.00 mg/dL   Calcium 8.8 (L) 8.9 - 10.3 mg/dL   Total Protein 6.2 (L) 6.5 - 8.1 g/dL   Albumin 2.9 (L) 3.5 - 5.0 g/dL   AST 17 15 - 41 U/L   ALT 28 0 - 44 U/L   Alkaline Phosphatase 138 (H) 38 - 126 U/L   Total Bilirubin 0.4 0.3 - 1.2 mg/dL   GFR calc non Af Amer >60 >60 mL/min   GFR calc Af Amer >60 >60 mL/min   Anion gap 9 5 - 15     Assessment:  [redacted]w[redacted]d SIUP  G1P0  SROM w/ early labor  Cat 1 FHR  GBS  Neg  Chronic ITP, prednisone  d/c'd yesterday  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

## 2018-09-21 ENCOUNTER — Encounter (HOSPITAL_COMMUNITY): Payer: Self-pay | Admitting: Obstetrics and Gynecology

## 2018-09-21 LAB — CBC
HCT: 29.1 % — ABNORMAL LOW (ref 36.0–46.0)
Hemoglobin: 9 g/dL — ABNORMAL LOW (ref 12.0–15.0)
MCH: 28 pg (ref 26.0–34.0)
MCHC: 30.9 g/dL (ref 30.0–36.0)
MCV: 90.4 fL (ref 80.0–100.0)
Platelets: 85 10*3/uL — ABNORMAL LOW (ref 150–400)
RBC: 3.22 MIL/uL — ABNORMAL LOW (ref 3.87–5.11)
RDW: 15.9 % — ABNORMAL HIGH (ref 11.5–15.5)
WBC: 14.5 10*3/uL — ABNORMAL HIGH (ref 4.0–10.5)
nRBC: 0 % (ref 0.0–0.2)

## 2018-09-21 MED ORDER — FERROUS FUMARATE 324 (106 FE) MG PO TABS
1.0000 | ORAL_TABLET | Freq: Every day | ORAL | Status: DC
  Administered 2018-09-21 – 2018-09-23 (×3): 106 mg via ORAL
  Filled 2018-09-21 (×3): qty 1

## 2018-09-21 NOTE — Anesthesia Postprocedure Evaluation (Signed)
Anesthesia Post Note  Patient: Melinda [name]  Procedure(s) Performed: CESAREAN SECTION (N/A Abdomen)     Anesthesia Post Evaluation  Last Vitals:  Vitals:   09/21/18 0603 09/21/18 0945  BP: 111/79 126/82  Pulse: 88 85  Resp: 18 20  Temp: 36.9 C 37.3 C  SpO2: 100% 100%    Last Pain:  Vitals:   09/21/18 1140  TempSrc:   PainSc: [place]

## 2018-09-21 NOTE — Lactation Note (Signed)
This note was copied from a baby's chart. Lactation Consultation Note  Patient Name: Melinda [name] Date: 09/21/2018 Reason for consult: Follow-up assessment;Difficult latch;Primapara;1st time breastfeeding;Term  Baby is 20 hours old  LC reviewed doc flow sheets and only had eaten 10 mins in the last 22 hours 10 mins , otherwise attempts.  11-7 am sized mom for a Nipple Shield and mom mentioned baby has been sleepy. Voided large x 1 and 5 stools. LC offered to assist to latch, changed diaper and it was dry, placed baby STS on the left breast , attempted to latch STS and not successful.  LC with gloved finger massaged baby's gum lines, and proceeded to  'get the baby to suck on a gloved finger. After several attempts baby latched  On the left breast / football/ #20 NS ( as resizing ) and baby fed for 10 mins with a few swallows and small amount of colostrum in the NS.  Mom had to get up to the bathroom. After getting back to bed, attempted to  Latch on the right breast due to the areola being more compressible than the left. Baby latched, but the baby's upper lip was noted to be rolled upward and appeared uncomfortable.  LC released the suction, applied the Nipple Shield, and baby latched with depth, and lips more natural positioned and appeared more comfortable.  Baby fed for 20 mins , swallows noted, increased with breast compressions, and small amount colostrum in the nipple shield after baby released. Nipple appeared well rounded. Baby content and satisfied after  Feeding and fell asleep.  Per mom both latches were comfortable except some cramping on the 1st breast.  LC recommended to use the #20 NS for latching due to the baby's recessed chin, also high palate. The Football position until baby is more consistent with latching.  After feeding 1st breast / offer 2nd breast , and post pump both breast for 10 -20 mins for milk to work with for an [place].   LC discussed with mom if  the baby doesn't continue to pick up with feedings this pm, supplementing is indicated due to baby only feeding 10 mins since birth and then this consult.  LC also stressed this to the [facility] caring for the baby.  LC reviewed the use of shells between feedings except when sleeping, DEBP and application of the Nipple Shield.   Mom is a 1st time mom and will need reassurance and assistance with feedings.    Maternal Data Has patient been taught Hand Expression?: Yes Does the patient have breastfeeding experience prior to this delivery?: No  Feeding Feeding Type: Breast Fed  LATCH Score Latch: Grasps breast easily, tongue down, lips flanged, rhythmical sucking.  Audible Swallowing: Spontaneous and intermittent  Type of Nipple: Everted at rest and after stimulation  Comfort (Breast/Nipple): Soft / non-tender  Hold (Positioning): Assistance needed to correctly position infant at breast and maintain latch.  LATCH Score: 9  Interventions Interventions: Breast feeding basics reviewed;Assisted with latch;Skin to skin;Breast massage;Breast compression;Adjust position;Support pillows;Position options  Lactation Tools Discussed/Used Tools: Shells;Pump Nipple shield size: 20 Shell Type: Inverted Breast pump type: Double-Electric Breast Pump WIC Program: Yes Pump Review: Setup, frequency, and cleaning;Milk Storage Initiated by:: MAI / reviewed   Consult Status Consult Status: Follow-up Date: 09/22/18 Follow-up type: In-patient    [place] 09/21/2018, 4:06 PM

## 2018-09-21 NOTE — Lactation Note (Signed)
This note was copied from a baby's chart. Lactation Consultation Note RN called for latch assistance d/t baby not interested. Baby will not open mouth. Mom holding baby STS. Baby fussy but will not latch. Baby abd. Slightly distended, has been spitting and gagging at times. Noted baby has significant recessed chin. Appears to be over bite. Gums has thick ridges and cyst. Palate high.  Mom has short shaft compressible nipples. Assessed suck w/gloved finger. Baby bites w/no suckle.  Mom needs NS for latching d/t high palate and chomping.  Fitted and taught application #16 NS. Mom demonstrated application. Baby will not latch. Encouraged mom to call when baby has interest in feeding. Try q 3 hrs. Mom shown how to use DEBP & how to disassemble, clean, & reassemble parts. Mom knows to pump q3h for 15-20 min. Mom pumped approx. 10 min. Then baby was finished w/bath so mom held baby STS. Mom encouraged to do skin-to-skin. Mom encouraged to waken baby for feeds if baby hasn't cued in 3 hrs. Mom encouraged to feed baby 8-12 times/24 hours and with feeding cues.  Gave mom shells to wear today to assist in everting nipples. Encouraged to call for assistance w/latching.  Patient Name: Melinda [name] XWRUE'A Date: 09/21/2018 Reason for consult: Mother's request;Difficult latch;Primapara;Term   Maternal Data Has patient been taught Hand Expression?: Yes Does the patient have breastfeeding experience prior to this delivery?: No  Feeding Feeding Type: Breast Fed  LATCH Score Latch: Too sleepy or reluctant, no latch achieved, no sucking elicited.  Audible Swallowing: None  Type of Nipple: Everted at rest and after stimulation(short shaft)  Comfort (Breast/Nipple): Soft / non-tender  Hold (Positioning): Full assist, staff holds infant at breast  LATCH Score: 4  Interventions Interventions: Breast feeding basics reviewed;Adjust position;DEBP;Assisted with latch;Support pillows;Skin to  skin;Position options;Breast massage;Expressed milk;Hand express;Pre-pump if needed;Shells;Breast compression  Lactation Tools Discussed/Used Tools: Shells;Pump;Nipple Shields Nipple shield size: 24;20 Shell Type: Inverted Breast pump type: Double-Electric Breast Pump Pump Review: Setup, frequency, and cleaning;Milk Storage Initiated by:: Allayne Stack RN IBCLC Date initiated:: 09/21/18   Consult Status Consult Status: Follow-up Date: 09/21/18 Follow-up type: In-patient    Theodoro Kalata 09/21/2018, 5:34 AM

## 2018-09-21 NOTE — Progress Notes (Signed)
CSW initially received consult for [facility] after 28 weeks. Per chart review, [name] appeared to have had her initial prenatal care visit at 26 weeks. CSW noted in chart review, that [name] was experiencing some anxiety surrounding the birth process and post-partum period. CSW met with [name] to offer support and complete assessment.    [name] resting in bed with infant asleep in bassinet and MGM present at bedside, when CSW entered the room. CSW introduced self and explained reason for visit to which [name] was understanding. [name] acknowledged meeting with Biagio Borg Social Worker with [facility], during her prenatal care. [name] reported she was actually feeling good and appeared surprised by this. [name] stated the labor process went well up until her emergency c-section but reported she was handling it well. [name] denied any previous mental health history but was receptive to education. Per [name], she has already discussed much of this with Roselyn Reef and has a follow up appointment with her in 2 weeks. CSW provided education regarding the baby blues period vs. perinatal mood disorders, discussed treatment and gave resources for mental health follow up if concerns arise.  CSW recommends self-evaluation during the postpartum time period using the New Mom Checklist from Postpartum Progress and encouraged [name] to contact a medical professional if symptoms are noted at any time. [name] did not appear to be displaying any acute mental health symptoms and denied any SI, HI or DV. [name] reported primary support as her mother.   [name] and MGM confirmed having all essential items for infant once discharged and reported infant would be sleeping in a bassinet once home. CSW provided review of Sudden Infant Death Syndrome (SIDS) precautions and safe sleeping habits.    CSW identifies no further need for intervention and no barriers to discharge at this time.  Melinda [name], [place]  [facility] [employer] [phone]

## 2018-09-21 NOTE — Progress Notes (Signed)
Postpartum Day 1: Cesarean Delivery due to NRFHT  Subjective: She is doing well since c-section. She endorses lower abdominal pain and stinging at the incisional site with movement that she rates as a 5/10. Claims pain is well managed with tylenol and ibuprofen. Patient noted pink tinged lochia with flow equivalent to a normal menstrual cycle. She has not noticed any clots. Her diet is progressing well and she is able to intake solids and liquids. Her foley catheter was removed this morning and she has yet to void since. She has not had bowel movements since her procedure. Denies light headedness, headache, visual changes, shortness of breath, chest pain or abdominal cramps.  For contraceptive, tentative plan is to use oral contraceptives in 6 weeks.   She plans to breast and bottle feed. Baby is having some issue latching. Lactation nurse signed on to assist in further care. Current plan is for outpatient circumcision.   Objective: Vital signs in last 24 hours: Temp:  [98.2 F (36.8 C)-99.3 F (37.4 C)] 98.5 F (36.9 C) (09/10 0603) Pulse Rate:  [74-200] 88 (09/10 0603) Resp:  [11-28] 18 (09/10 0603) BP: (111-140)/(58-98) 111/79 (09/10 0603) SpO2:  [92 %-100 %] 100 % (09/10 8466)  Physical Exam:  General: alert, cooperative, appears stated age and no distress  Cardio: RRR. Normal S1, S2. No M/R/G  Pulm: CTAB, no wheezes or rhonci Abd: Soft abdomen, NABS, tenderness to deep palpation Lochia: appropriate Uterine Fundus: firm Incision: healing well, no significant erythema seen through honeycomb  DVT Evaluation: No evidence of DVT seen on physical exam. No significant calf/ankle edema. SCDs on.  Recent Labs    09/20/18 1536 09/21/18 0528  HGB 9.9* 9.0*  HCT 31.6* 29.1*   Assessment/Plan: POD#1 Status post Cesarean section. Doing well postoperatively.  Continue current care: - encourage ambulation and incentive spirometry use  - patient plans to bottle/breast feed  - social  work consult pending   Marijean Bravo Brinson 09/21/2018, 8:44 AM

## 2018-09-22 MED ORDER — ACETAMINOPHEN 325 MG PO TABS
650.0000 mg | ORAL_TABLET | ORAL | Status: DC | PRN
  Administered 2018-09-22 (×2): 650 mg via ORAL
  Filled 2018-09-22 (×2): qty 2

## 2018-09-22 MED ORDER — INFLUENZA VAC SPLIT QUAD 0.5 ML IM SUSY
0.5000 mL | PREFILLED_SYRINGE | Freq: Once | INTRAMUSCULAR | Status: DC
  Filled 2018-09-22: qty 0.5

## 2018-09-22 NOTE — Lactation Note (Signed)
This note was copied from a baby's chart. Lactation Consultation Note  Patient Name: Melinda [name] VZCHY'I Date: 09/22/2018 Reason for consult: Follow-up assessment;Infant weight loss  Baby is 64 hours old latching has been difficult due to a significant recessed chin and over bite/ high palate and baby not opening his mouth wide.  Per mom the baby was up all night and cluster fed , bottle x 1 due to not being satisfied.  Baby latched when [facility] entered and noted the baby to latch well with The NS #20 and per mom comfortable. After the baby released the nipple was noted to be almost to the inner part of the NS.  LC recommended resizing and instilling formula due to no EBM being available. LC resized with the #24 NS / instill 83ml of the formula and baby latched with depth and wider mouth and stayed in a good rhythmic pattern  With swallows. After baby finished  LC showed mom and grandmother how to PACE feed. Baby PACE fed well and mom able to repeat demo.  LC recommended an artifical nipple due to baby needing  assistance to open wider and finger feeding would narrow the sucking pattern.   LC plan due to weight loss and DL:  Breast shells between feedings except when sleeping.  Breast massage, hand express, pre-pump if needed and  Apply the #24 NS and instill the EBM or formula in the top for appetizer.  Latch with firm support  And feed the baby 15 -20 mins / 30 mins max and supplement PACE feeding with purple ( slow flow ) nipple 25 -30 ml due to weight loss.  After breast feeding and PACE feed  Post pump both breast for 15 - 20 mins both breast and save milk for the next feeding.   LC reviewed the [place] plan wit the [place] and orientee      Maternal Data Has patient been taught Hand Expression?: Yes  Feeding Feeding Type: Formula Nipple Type: Extra Slow Flow  LATCH Score Latch: Grasps breast easily, tongue down, lips flanged, rhythmical sucking.  Audible  Swallowing: Spontaneous and intermittent  Type of Nipple: Everted at rest and after stimulation  Comfort (Breast/Nipple): Soft / non-tender  Hold (Positioning): Assistance needed to correctly position infant at breast and maintain latch.  LATCH Score: 9  Interventions Interventions: Breast feeding basics reviewed;DEBP  Lactation Tools Discussed/Used Tools: Pump;Shells(LC set up the DEBP for mom and the #24 F is still s good fit) Nipple shield size: 20;24(switched up to thr #24 NS , better fit) Shell Type: Inverted Breast pump type: Double-Electric Breast Pump   Consult Status Consult Status: Follow-up Date: 09/23/18 Follow-up type: In-patient    [place] 09/22/2018, 12:02 PM

## 2018-09-22 NOTE — Lactation Note (Signed)
This note was copied from a baby's chart. Lactation Consultation Note Baby 33 hrs old. Mom asked for formula d/t feeling like baby isn't getting enough. Mom stated she is to exhausted to pump.  LC f/u w/mom to check on feedings. Mom stated she was feeling better since she finally got some sleep and not feeling so anxious. Mom stated that is still plans to BF, feels the baby needs to be supplemented until her milk comes in. Understands the need for stimulation/pumping. LC w/f/u w/mom again today for feeding. Mom knows to call for assistance.  Patient Name: Melinda [name] BULAG'T Date: 09/22/2018     Maternal Data    Feeding Feeding Type: Bottle Fed - Formula Nipple Type: Slow - flow  LATCH Score                   Interventions    Lactation Tools Discussed/Used     Consult Status      Telesha Deguzman G 09/22/2018, 4:22 AM

## 2018-09-22 NOTE — Progress Notes (Signed)
Post Partum Day 2 for pLTCS for NRFHT. Hx of chronic ITP-prednisolone   Subjective: Abdo cramps well controlled with analgesia. Did not have a good day yesterday as baby did not latch onto breast and pt reported low mood. Slept through most of the day yesterday  and her mother took care of the baby. Feels adequately rested today and will try to mobilise more today. Lochia is appropriate, passed 1 X small clot. Changed yellow pads X 3.  Desires OP circ for baby. Contraception: would like pills. Pt is happy to go home tomorrow.   Denies headache, visual changes, chest pain, shortness of breath, fevers or RUQ pain.   Objective: Blood pressure 122/84, pulse 89, temperature 98.6 F (37 C), temperature source Oral, resp. rate 18, height 5\' 2"  (1.575 m), weight 70.9 kg, last menstrual period 12/25/2017, SpO2 100 %, unknown if currently breastfeeding.  Physical Exam:  General: alert, cooperative and appears stated age  Cardiac: RRR, no rubs or gallops Pulm: chest clear on ausc, no crackles or wheeze, no resp distress GI: abdo soft, mildly tender in lower adomen, no guarding, bowel sounds present  Lochia: appropriate Uterine Fundus: firm Incision: no significant drainage, no dehiscence DVT Evaluation: No cords or calf tenderness. No significant calf/ankle edema.  Recent Labs    09/20/18 1536 09/21/18 0528  HGB 9.9* 9.0*  HCT 31.6* 29.1*    Assessment/Plan: Post Partum Day 2 for pLTCS for NRFHT.  Continue current care: Plan for discharge tomorrow  Encourage mobilization Lactation specialist consult Contraception: contraceptive pill   LOS: 2 days   Melinda [name] 09/22/2018, 7:59 AM

## 2018-09-23 LAB — CBC
HCT: 28.9 % — ABNORMAL LOW (ref 36.0–46.0)
Hemoglobin: 9.2 g/dL — ABNORMAL LOW (ref 12.0–15.0)
MCH: 28.9 pg (ref 26.0–34.0)
MCHC: 31.8 g/dL (ref 30.0–36.0)
MCV: 90.9 fL (ref 80.0–100.0)
Platelets: 80 10*3/uL — ABNORMAL LOW (ref 150–400)
RBC: 3.18 MIL/uL — ABNORMAL LOW (ref 3.87–5.11)
RDW: 16.6 % — ABNORMAL HIGH (ref 11.5–15.5)
WBC: 6.6 10*3/uL (ref 4.0–10.5)
nRBC: 0 % (ref 0.0–0.2)

## 2018-09-23 MED ORDER — FERROUS SULFATE 325 (65 FE) MG PO TABS: 325.0000 mg | ORAL_TABLET | Freq: Every day | ORAL | 3 refills | Status: DC

## 2018-09-23 MED ORDER — ACETAMINOPHEN 325 MG PO TABS: 650.0000 mg | ORAL_TABLET | ORAL | 1 refills | Status: AC | PRN

## 2018-09-23 MED ORDER — SENNOSIDES-DOCUSATE SODIUM 8.6-50 MG PO TABS: 2.0000 | ORAL_TABLET | ORAL | 0 refills | Status: AC

## 2018-09-23 MED ORDER — OXYCODONE-ACETAMINOPHEN 5-325 MG PO TABS: 1.0000 | ORAL_TABLET | Freq: Four times a day (QID) | ORAL | 0 refills | Status: DC | PRN

## 2018-09-23 NOTE — Lactation Note (Signed)
This note was copied from a baby's chart. Lactation Consultation Note  Patient Name: Melinda [name] ZOXWR'U Date: 09/23/2018 Reason for consult: Follow-up assessment;Term;Primapara;1st time breastfeeding  P1 mother whose infant is now 24 hours old.  Mother has been breast/bottle feeding.  Baby has been formula feeding since 1100 yesterday morning.  She wishes to continue breast/bottle feeding.  Mother has a DEBP set up at bedside but has not pumped consistently.  Her breasts do not feel heavier or fuller this morning at 64 hours post delivery.  Encouraged mother to place baby at the breast with every feed.  She can continue to supplement with formula until she obtains EBM.  Also encouraged her to use the DEBP after every feeding to help stimulate breasts and increase milk supply.  Hand expression also emphasized as beneficial for milk production.      Baby was not showing feeding cues but it has been 3 hours since he formula fed 45 mls.  Offered to attempt to latch and mother accepted.  He was awake and calm.  Unswaddled him and placed him STS with mother for a few minutes to awaken.  Assisted to latch to the left breast in the football hold but he was not interested in initiating sucking.  Breast compressions demonstrated.  Mother was not able to hand express any colostrum drops at this time.  Placed him back STS and mother will observe for feeding cues.  Spoke with mother and grandmother about making an OP lactation consult as needed.  Mother is a [facility] participant but also has Multimedia programmer.  Reviewed the support group information and emphasized that these two services could be very beneficial for mother.  She has a DEBP for home use.    Engorgement prevention/treatment reviewed.  Pump given with instructions for use.  Mother will call for any further questions prior to discharge.   Maternal Data Formula Feeding for Exclusion: Yes Reason for exclusion: Mother's choice to formula and  breast feed on admission Has patient been taught Hand Expression?: Yes Does the patient have breastfeeding experience prior to this delivery?: No  Feeding Feeding Type: Breast Fed Nipple Type: Extra Slow Flow  LATCH Score Latch: Too sleepy or reluctant, no latch achieved, no sucking elicited.  Audible Swallowing: None  Type of Nipple: Everted at rest and after stimulation(short shafted)  Comfort (Breast/Nipple): Soft / non-tender  Hold (Positioning): Assistance needed to correctly position infant at breast and maintain latch.  LATCH Score: 5  Interventions Interventions: Breast feeding basics reviewed;Assisted with latch;Skin to skin;Breast massage;Hand express;Hand pump;Shells;Position options;Support pillows;Adjust position;DEBP  Lactation Tools Discussed/Used Tools: Pump;Nipple Shields Breast pump type: Double-Electric Breast Pump;Manual WIC Program: Yes Pump Review: Setup, frequency, and cleaning(Manual pump) Initiated by:: [name] Date initiated:: 09/23/18   Consult Status Consult Status: Complete Date: 09/23/18 Follow-up type: Call as needed    Hong Timm R Marilyne Haseley 09/23/2018, 9:11 AM

## 2018-09-23 NOTE — Discharge Instructions (Signed)
No lifting greater than 10lbs. No driving for 4 weeks Remove dressing on Day 6.    Postpartum Care After Cesarean Delivery This sheet gives you information about how to care for yourself from the time you deliver your baby to up to 6-12 weeks after delivery (postpartum period). Your health care provider may also give you more specific instructions. If you have problems or questions, contact your health care provider. Follow these instructions at home: Medicines  Take over-the-counter and prescription medicines only as told by your health care provider.  If you were prescribed an antibiotic medicine, take it as told by your health care provider. Do not stop taking the antibiotic even if you start to feel better.  Ask your health care provider if the medicine prescribed to you: ? Requires you to avoid driving or using heavy machinery. ? Can cause constipation. You may need to take actions to prevent or treat constipation, such as:  Drink enough fluid to keep your urine pale yellow.  Take over-the-counter or prescription medicines.  Eat foods that are high in fiber, such as beans, whole grains, and fresh fruits and vegetables.  Limit foods that are high in fat and processed sugars, such as fried or sweet foods. Activity  Gradually return to your normal activities as told by your health care provider.  Avoid activities that take a lot of effort and energy (are strenuous) until approved by your health care provider. Walking at a slow to moderate pace is usually safe. Ask your health care provider what activities are safe for you. ? Do not lift anything that is heavier than your baby or 10 lb (4.5 kg) as told by your health care provider. ? Do not vacuum, climb stairs, or drive a car for as long as told by your health care provider.  If possible, have someone help you at home until you are able to do your usual activities yourself.  Rest as much as possible. Try to rest or take naps  while your baby is sleeping. Vaginal bleeding  It is normal to have vaginal bleeding (lochia) after delivery. Wear a sanitary pad to absorb vaginal bleeding and discharge. ? During the first week after delivery, the amount and appearance of lochia is often similar to a menstrual period. ? Over the next few weeks, it will gradually decrease to a dry, yellow-brown discharge. ? For most women, lochia stops completely by 4-6 weeks after delivery. Vaginal bleeding can vary from woman to woman.  Change your sanitary pads frequently. Watch for any changes in your flow, such as: ? A sudden increase in volume. ? A change in color. ? Large blood clots.  If you pass a blood clot, save it and call your health care provider to discuss. Do not flush blood clots down the toilet before you get instructions from your health care provider.  Do not use tampons or douches until your health care provider says this is safe.  If you are not breastfeeding, your period should return 6-8 weeks after delivery. If you are breastfeeding, your period may return anytime between 8 weeks after delivery and the time that you stop breastfeeding. Perineal care   If your C-section (Cesarean section) was unplanned, and you were allowed to labor and push before delivery, you may have pain, swelling, and discomfort of the tissue between your vaginal opening and your anus (perineum). You may also have an incision in the tissue (episiotomy) or the tissue may have torn during delivery. Follow these  instructions as told by your health care provider: ? Keep your perineum clean and dry as told by your health care provider. Use medicated pads and pain-relieving sprays and creams as directed. ? If you have an episiotomy or vaginal tear, check the area every day for signs of infection. Check for:  Redness, swelling, or pain.  Fluid or blood.  Warmth.  Pus or a bad smell. ? You may be given a squirt bottle to use instead of wiping to  clean the perineum area after you go to the bathroom. As you start healing, you may use the squirt bottle before wiping yourself. Make sure to wipe gently. ? To relieve pain caused by an episiotomy, vaginal tear, or hemorrhoids, try taking a warm sitz bath 2-3 times a day. A sitz bath is a warm water bath that is taken while you are sitting down. The water should only come up to your hips and should cover your buttocks. Breast care  Within the first few days after delivery, your breasts may feel heavy, full, and uncomfortable (breast engorgement). You may also have milk leaking from your breasts. Your health care provider can suggest ways to help relieve breast discomfort. Breast engorgement should go away within a few days.  If you are breastfeeding: ? Wear a bra that supports your breasts and fits you well. ? Keep your nipples clean and dry. Apply creams and ointments as told by your health care provider. ? You may need to use breast pads to absorb milk leakage. ? You may have uterine contractions every time you breastfeed for several weeks after delivery. Uterine contractions help your uterus return to its normal size. ? If you have any problems with breastfeeding, work with your health care provider or a lactation consultant.  If you are not breastfeeding: ? Avoid touching your breasts as this can make your breasts produce more milk. ? Wear a well-fitting bra and use cold packs to help with swelling. ? Do not squeeze out (express) milk. This causes you to make more milk. Intimacy and sexuality  Ask your health care provider when you can engage in sexual activity. This may depend on your: ? Risk of infection. ? Healing rate. ? Comfort and desire to engage in sexual activity.  You are able to get pregnant after delivery, even if you have not had your period. If desired, talk with your health care provider about methods of family planning or birth control (contraception). Lifestyle  Do  not use any products that contain nicotine or tobacco, such as cigarettes, e-cigarettes, and chewing tobacco. If you need help quitting, ask your health care provider.  Do not drink alcohol, especially if you are breastfeeding. Eating and drinking   Drink enough fluid to keep your urine pale yellow.  Eat high-fiber foods every day. These may help prevent or relieve constipation. High-fiber foods include: ? Whole grain cereals and breads. ? Brown rice. ? Beans. ? Fresh fruits and vegetables.  Take your prenatal vitamins until your postpartum checkup or until your health care provider tells you it is okay to stop. General instructions  Keep all follow-up visits for you and your baby as told by your health care provider. Most women visit their health care provider for a postpartum checkup within the first 3-6 weeks after delivery. Contact a health care provider if you:  Feel unable to cope with the changes that a new baby brings to your life, and these feelings do not go away.  Feel unusually   sad or worried.  Have breasts that are painful, hard, or turn red.  Have a fever.  Have trouble holding urine or keeping urine from leaking.  Have little or no interest in activities you used to enjoy.  Have not breastfed at all and you have not had a menstrual period for 12 weeks after delivery.  Have stopped breastfeeding and you have not had a menstrual period for 12 weeks after you stopped breastfeeding.  Have questions about caring for yourself or your baby.  Pass a blood clot from your vagina. Get help right away if you:  Have chest pain.  Have difficulty breathing.  Have sudden, severe leg pain.  Have severe pain or cramping in your abdomen.  Bleed from your vagina so much that you fill more than one sanitary pad in one hour. Bleeding should not be heavier than your heaviest period.  Develop a severe headache.  Faint.  Have blurred vision or spots in your  vision.  Have a bad-smelling vaginal discharge.  Have thoughts about hurting yourself or your baby. If you ever feel like you may hurt yourself or others, or have thoughts about taking your own life, get help right away. You can go to your nearest emergency department or call:  Your local emergency services (911 in the [place]  A suicide crisis helpline, such as the [place] at 574-381-0001. This is open 24 hours a day. Summary  The period of time from when you deliver your baby to up to 6-12 weeks after delivery is called the postpartum period.  Gradually return to your normal activities as told by your health care provider.  Keep all follow-up visits for you and your baby as told by your health care provider. This information is not intended to replace advice given to you by your health care provider. Make sure you discuss any questions you have with your health care provider. Document Released: 12/26/1999 Document Revised: 08/17/2017 Document Reviewed: 08/17/2017 Elsevier Patient Education  2020 [employer].

## 2018-09-25 ENCOUNTER — Other Ambulatory Visit: Payer: Medicaid Other

## 2018-09-26 ENCOUNTER — Other Ambulatory Visit (HOSPITAL_COMMUNITY)
Admission: RE | Admit: 2018-09-26 | Discharge: 2018-09-26 | Disposition: A | Discharge status: Home / Self Care | Payer: Medicaid Other | Source: Ambulatory Visit | Attending: Family Medicine | Admitting: Family Medicine

## 2018-09-28 ENCOUNTER — Inpatient Hospital Stay (HOSPITAL_COMMUNITY): Payer: Medicaid Other

## 2018-09-29 NOTE — BH Specialist Note (Signed)
Integrated Behavioral Health via Telemedicine Video Visit  09/29/2018 Melinda [name] [id number]  Number of [place] visits: 2 Session Start time: 9:24  Session End time: 9:35 Total time: 15 minutes  Referring Provider: Clayton Lefort, MD Type of Visit: Video Patient/Family location: Home [facility] Provider location: [facility] All persons participating in visit: Patient Melinda [name] and [place]   Confirmed patient's address: Yes  Confirmed patient's phone number: Yes  Any changes to demographics: No   Confirmed patient's insurance: Yes  Any changes to patient's insurance: No   Discussed confidentiality: Yes   I connected with Melinda [name]' by a video enabled telemedicine application and verified that I am speaking with the correct person using two identifiers.     I discussed the limitations of evaluation and management by telemedicine and the availability of in person appointments.  I discussed that the purpose of this visit is to provide behavioral health care while limiting exposure to the novel coronavirus.   Discussed there is a possibility of technology failure and discussed alternative modes of communication if that failure occurs.  I discussed that engaging in this video visit, they consent to the provision of behavioral healthcare and the services will be billed under their insurance.  Patient and/or legal guardian expressed understanding and consented to video visit: Yes   PRESENTING CONCERNS: Patient and/or family reports the following symptoms/concerns: Pt states she is feeling much better than her last visit, birth went well, she is feeling supported by friends and family, is sleeping and eating well, and taking iron pills as prescribed.  Duration of problem: -; Severity of problem: n/a  STRENGTHS (Protective Factors/Coping Skills): Strong social support; positive outlook  GOALS ADDRESSED: Patient will: 1.  Maintain reduction of  symptoms of: anxiety  2.  Increase knowledge and/or ability of: healthy habits  3.  Demonstrate ability to: Increase healthy adjustment to current life circumstances  INTERVENTIONS: Interventions utilized:  Psychoeducation and/or Health Education and Link to [employer] Standardized Assessments completed: GAD-7 and PHQ 9  ASSESSMENT: Patient currently experiencing History of anxiety  Patient may benefit from psychoeducation and brief therapeutic interventions regarding maintaining reduction of symptoms of anxiety .  PLAN: 1. Follow up with behavioral health clinician on : As needed 2. Behavioral recommendations:  -Continue taking iron pills as prescribed -Continue sleeping when baby sleeps -Consider registering for and attending at least one new mom online support group at either [email or web address].com and/or [email or web address] 3. Referral(s): [place] (In Clinic)  I discussed the assessment and treatment plan with the patient and/or parent/guardian. They were provided an opportunity to ask questions and all were answered. They agreed with the plan and demonstrated an understanding of the instructions.   They were advised to call back or seek an in-person evaluation if the symptoms worsen or if the condition fails to improve as anticipated.  Jamie C McMannes  .

## 2018-10-02 ENCOUNTER — Other Ambulatory Visit: Payer: Self-pay

## 2018-10-02 ENCOUNTER — Ambulatory Visit: Payer: Medicaid Other | Admitting: Clinical

## 2018-10-02 DIAGNOSIS — Z8659 Personal history of other mental and behavioral disorders: Secondary | ICD-10-CM

## 2018-10-05 ENCOUNTER — Ambulatory Visit: Payer: Medicaid Other | Admitting: Obstetrics and Gynecology

## 2018-10-06 ENCOUNTER — Ambulatory Visit (INDEPENDENT_AMBULATORY_CARE_PROVIDER_SITE_OTHER): Payer: Medicaid Other | Admitting: Student

## 2018-10-06 ENCOUNTER — Other Ambulatory Visit: Payer: Self-pay

## 2018-10-06 VITALS — BP 130/89 | HR 86 | Temp 98.9°F | Wt 131.8 lb

## 2018-10-06 DIAGNOSIS — D696 Thrombocytopenia, unspecified: Secondary | ICD-10-CM

## 2018-10-06 DIAGNOSIS — Z4889 Encounter for other specified surgical aftercare: Secondary | ICD-10-CM | POA: Insufficient documentation

## 2018-10-06 NOTE — Patient Instructions (Signed)

## 2018-10-06 NOTE — Progress Notes (Signed)
S: Melinda [name] is at 2 weeks s/p cesarean section here for incision check. Has no complaints. C/section for failure to progress & fetal distress. 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 States she completed her steroid course.    O: BP 130/89 (BP Location: Right Arm, Patient Position: Sitting, Cuff Size: Normal)   Pulse 86   Temp 98.9 F (37.2 C) (Oral)   Wt 131 lb 12.8 oz (59.8 kg)   BMI 24.11 kg/m  Physical Exam Vitals signs reviewed.  Constitutional:      General: She is not in acute distress.    Appearance: Normal appearance.  Abdominal:     Palpations: Abdomen is soft.     Tenderness: There is no abdominal tenderness.     Comments: Incision intact. No drainage. No signs of infection. Steri strips in place.   Neurological:     Mental Status: She is alert.  Psychiatric:        Mood and Affect: Mood normal.        Behavior: Behavior normal.     A/P: 1. Encounter for post surgical wound check -incision looks good -instructed to remove steri strips -has PP visit already scheduled  2. Thrombocytopenia ([place]) -pt complete steroids as prescribed - CBC  Jorje Guild, NP

## 2018-10-07 LAB — CBC
Hematocrit: 35.1 % (ref 34.0–46.6)
Hemoglobin: 11.5 g/dL (ref 11.1–15.9)
MCH: 27.6 pg (ref 26.6–33.0)
MCHC: 32.8 g/dL (ref 31.5–35.7)
MCV: 84 fL (ref 79–97)
Platelets: 120 10*3/uL — ABNORMAL LOW (ref 150–450)
RBC: 4.16 x10E6/uL (ref 3.77–5.28)
RDW: 15.7 % — ABNORMAL HIGH (ref 11.7–15.4)
WBC: 2.5 10*3/uL — CL (ref 3.4–10.8)

## 2018-10-09 ENCOUNTER — Encounter: Payer: Self-pay | Admitting: Hematology and Oncology

## 2018-10-09 ENCOUNTER — Inpatient Hospital Stay: Payer: Medicaid Other | Admitting: Hematology and Oncology

## 2018-10-09 ENCOUNTER — Inpatient Hospital Stay: Payer: Medicaid Other

## 2018-10-10 ENCOUNTER — Encounter: Payer: Self-pay | Admitting: Hematology and Oncology

## 2018-10-13 ENCOUNTER — Ambulatory Visit: Payer: Medicaid Other | Admitting: Cardiology

## 2018-11-02 ENCOUNTER — Other Ambulatory Visit: Payer: Self-pay

## 2018-11-02 ENCOUNTER — Encounter: Payer: Self-pay | Admitting: Obstetrics and Gynecology

## 2018-11-02 ENCOUNTER — Telehealth (INDEPENDENT_AMBULATORY_CARE_PROVIDER_SITE_OTHER): Payer: Medicaid Other | Admitting: Obstetrics and Gynecology

## 2018-11-02 DIAGNOSIS — D696 Thrombocytopenia, unspecified: Secondary | ICD-10-CM

## 2018-11-02 DIAGNOSIS — Z30011 Encounter for initial prescription of contraceptive pills: Secondary | ICD-10-CM

## 2018-11-02 DIAGNOSIS — Z8659 Personal history of other mental and behavioral disorders: Secondary | ICD-10-CM

## 2018-11-02 DIAGNOSIS — D709 Neutropenia, unspecified: Secondary | ICD-10-CM | POA: Insufficient documentation

## 2018-11-02 MED ORDER — NORGESTIM-ETH ESTRAD TRIPHASIC 0.18/0.215/0.25 MG-25 MCG PO TABS: 1.0000 | ORAL_TABLET | Freq: Every day | ORAL | 11 refills | Status: DC

## 2018-11-02 NOTE — Progress Notes (Signed)
MY CHART VIDEO POSTPARTUM VISIT ENCOUNTER NOTE  I connected with@ on 11/04/18 at 10:10 AM EDT by My Chart video at home and verified that I am speaking with the correct person using two identifiers.   I discussed the limitations, risks, security and privacy concerns of performing an evaluation and management service by My Chart video and the availability of in person appointments. I also discussed with the patient that there may be a patient responsible charge related to this service. The patient expressed understanding and agreed to proceed.  Appointment Date: 11/04/2018  OBGYN Clinic: [facility]  Chief Complaint:  Postpartum Visit  History of Present Illness: Melinda [name] is a 33 y.o. African-American G1P1001 (Patient's last menstrual period was 10/31/2018 (exact date).), seen for the above chief complaint. Her past medical history is significant for late to care, gestational thrombocytopenia, anxiety    She is s/p primary cesarean section on 09/20/2018 at 39.6 weeks; she was discharged to home on POD#3. Pregnancy complicated by late to care, gestational thrombocytopenia, anxiety. Baby "Legend" is doing well.  Complains of none today  Vaginal bleeding or discharge: Yes  Mode of feeding infant: Bottle Intercourse: No  Contraception: patient desires pills PP depression s/s: Yes .  Any bowel or bladder issues: No  Pap smear: Needs to be done - not done at [facility] d/t no insurance -- referral made to free cervical cancer screening clinic, unable to be seen there until 10/2018. 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  Review of Systems: Her 12 point review of systems is negative or as noted in the History of Present Illness.  Patient Active Problem List   Diagnosis Date Noted  . Neutropenia (HCC) 11/02/2018  . Delivery of pregnancy by cesarean section 10/06/2018  . Encounter for post surgical wound check 10/06/2018  . Herpes simplex infection in mother  during third trimester of pregnancy 08/31/2018  . Uterine size date discrepancy pregnancy, third trimester 08/24/2018  . Thrombocytopenia affecting pregnancy (HCC) 06/17/2018  . Supervision of normal first pregnancy, antepartum 06/15/2018  . Cervical high risk HPV (human papillomavirus) test positive 02/24/2017  . Herpes simplex 09/11/2013  . 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 Thrombocytopenia (HCC)   . Anemia   . Iron deficiency anemia   . CHICKENPOX, HX OF 04/10/2007    Medications Aracelia Asaro had no medications administered during this visit. Current Outpatient Medications  Medication Sig Dispense Refill  . ferrous sulfate 325 (65 FE) MG tablet Take 1 tablet (325 mg total) by mouth daily. 30 tablet 3  . Prenat-Fe Carbonyl-FA-Omega 3 (ONE-A-DAY WOMENS PRENATAL 1) 28-0.8-235 MG CAPS Take 1 capsule by mouth daily. 30 capsule 0  . valACYclovir (VALTREX) 500 MG tablet Take 1 tablet (500 mg total) by mouth 2 (two) times daily. 60 tablet 1  . acetaminophen (TYLENOL) 325 MG tablet Take 2 tablets (650 mg total) by mouth every 4 (four) hours as needed for up to 30 doses for mild pain. (Patient not taking: Reported on 11/02/2018) 30 tablet 1  . Norgestimate-Ethinyl Estradiol Triphasic 0.18/0.215/0.25 MG-25 MCG tab Take 1 tablet by mouth daily. 1 Package 11  . oxyCODONE-acetaminophen (PERCOCET) 5-325 MG tablet Take 1-2 tablets by mouth every 6 (six) hours as needed for up to 8 doses for severe pain. (Patient not taking: Reported on 11/02/2018) 8 tablet 0   No current facility-administered medications for this visit.     Allergies Patient has no known allergies.  Physical Exam:  General:  Alert, oriented  and cooperative.   Mental Status: Normal mood and affect perceived. Normal judgment and thought content.  Rest of physical exam deferred due to type of encounter  PP Depression Screening:   Edinburgh Postnatal Depression Scale - 11/02/18 1027      Edinburgh Postnatal Depression Scale:   In the Past 7 Days   I have been able to laugh and see the funny side of things.  0    I have looked forward with enjoyment to things.  0    I have blamed myself unnecessarily when things went wrong.  1    I have been anxious or worried for no good reason.  2    I have felt scared or panicky for no good reason.  1    Things have been getting on top of me.  1    I have been so unhappy that I have had difficulty sleeping.  0    I have felt sad or miserable.  1    I have been so unhappy that I have been crying.  1    The thought of harming myself has occurred to me.  0    Edinburgh Postnatal Depression Scale Total  7       Assessment/Plan:Patient is a 33 y.o. G1P1001 who is 6 weeks postpartum from a primary cesarean section.  She is doing well.   Encounter for postpartum visit - Needs pap test in 1 week  History of anxiety - Patient "feeling fine" with a good support system in place - Offered to have [employer] f/u -- declined at this time - Reassurance given that she can call to schedule a f/u appt with Roselyn Reef at any time she needs  Thrombocytopenia ([place]) - Missed f/u appt with hematologist 2 wks ago; not rescheduled - Advised to call to reschedule that appt ASAP - Plan repeat CBC in 1 week, if not seen by hematologist next week  Neutropenia, unspecified type ([place]) - Plan repeat CBC in 1 week, if not seen by hematologist next week   RTC 1 week for pap and CBC  I discussed the assessment and treatment plan with the patient. The patient was provided an opportunity to ask questions and all were answered. The patient agreed with the plan and demonstrated an understanding of the instructions.   The patient was advised to call back or seek an in-person evaluation/go to the ED for any concerning postpartum symptoms.  I provided 10 minutes of non-face-to-face time during this encounter. There was 5 minutes of chart review time spent prior to this encounter. Total time spent = 15  minutes.    Laury Deep, [place] [facility] [place]

## 2018-11-03 ENCOUNTER — Encounter: Payer: Self-pay | Admitting: Hematology and Oncology

## 2018-11-06 ENCOUNTER — Ambulatory Visit: Payer: Medicaid Other

## 2019-01-25 ENCOUNTER — Encounter (HOSPITAL_COMMUNITY): Payer: Self-pay | Admitting: Family Medicine

## 2020-12-31 ENCOUNTER — Telehealth: Payer: Self-pay | Admitting: Physician Assistant

## 2020-12-31 NOTE — Telephone Encounter (Signed)
Scheduled appt per 12/21 referral. Pt is aware of appt date and time. Pt is aware to arrive 15 mins prior to appt.  °

## 2021-01-20 ENCOUNTER — Telehealth: Payer: Self-pay | Admitting: Pharmacy Technician

## 2021-01-20 ENCOUNTER — Inpatient Hospital Stay: Payer: Medicaid Other | Attending: Physician Assistant | Admitting: Physician Assistant

## 2021-01-20 ENCOUNTER — Encounter: Payer: Self-pay | Admitting: Physician Assistant

## 2021-01-20 ENCOUNTER — Other Ambulatory Visit: Payer: Self-pay

## 2021-01-20 ENCOUNTER — Inpatient Hospital Stay: Payer: Medicaid Other

## 2021-01-20 VITALS — BP 133/60 | HR 92 | Temp 97.5°F | Resp 17 | Wt 139.4 lb

## 2021-01-20 DIAGNOSIS — N920 Excessive and frequent menstruation with regular cycle: Secondary | ICD-10-CM | POA: Diagnosis present

## 2021-01-20 DIAGNOSIS — D500 Iron deficiency anemia secondary to blood loss (chronic): Secondary | ICD-10-CM | POA: Insufficient documentation

## 2021-01-20 DIAGNOSIS — Z79899 Other long term (current) drug therapy: Secondary | ICD-10-CM | POA: Insufficient documentation

## 2021-01-20 DIAGNOSIS — R0602 Shortness of breath: Secondary | ICD-10-CM | POA: Insufficient documentation

## 2021-01-20 DIAGNOSIS — D696 Thrombocytopenia, unspecified: Secondary | ICD-10-CM

## 2021-01-20 LAB — RETIC PANEL
Immature Retic Fract: 18.7 % — ABNORMAL HIGH (ref 2.3–15.9)
RBC.: 3.96 MIL/uL (ref 3.87–5.11)
Retic Count, Absolute: 51.9 10*3/uL (ref 19.0–186.0)
Retic Ct Pct: 1.3 % (ref 0.4–3.1)
Reticulocyte Hemoglobin: 25.3 pg — ABNORMAL LOW (ref >27.9)

## 2021-01-20 LAB — CBC WITH DIFFERENTIAL (CANCER CENTER ONLY)
Abs Immature Granulocytes: 0.01 10*3/uL (ref 0.00–0.07)
Basophils Absolute: 0 10*3/uL (ref 0.0–0.1)
Basophils Relative: 1 %
Eosinophils Absolute: 0.1 10*3/uL (ref 0.0–0.5)
Eosinophils Relative: 2 %
HCT: 32.7 % — ABNORMAL LOW (ref 36.0–46.0)
Hemoglobin: 9.8 g/dL — ABNORMAL LOW (ref 12.0–15.0)
Immature Granulocytes: 0 %
Lymphocytes Relative: 42 %
Lymphs Abs: 1.7 10*3/uL (ref 0.7–4.0)
MCH: 24.3 pg — ABNORMAL LOW (ref 26.0–34.0)
MCHC: 30 g/dL (ref 30.0–36.0)
MCV: 80.9 fL (ref 80.0–100.0)
Monocytes Absolute: 0.3 10*3/uL (ref 0.1–1.0)
Monocytes Relative: 8 %
Neutro Abs: 1.9 10*3/uL (ref 1.7–7.7)
Neutrophils Relative %: 47 %
Platelet Count: 118 10*3/uL — ABNORMAL LOW (ref 150–400)
RBC: 4.04 MIL/uL (ref 3.87–5.11)
RDW: 17.5 % — ABNORMAL HIGH (ref 11.5–15.5)
WBC Count: 4 10*3/uL (ref 4.0–10.5)
nRBC: 0 % (ref 0.0–0.2)

## 2021-01-20 LAB — CMP (CANCER CENTER ONLY)
ALT: 17 U/L (ref 0–44)
AST: 14 U/L — ABNORMAL LOW (ref 15–41)
Albumin: 4 g/dL (ref 3.5–5.0)
Alkaline Phosphatase: 51 U/L (ref 38–126)
Anion gap: 6 (ref 5–15)
BUN: 12 mg/dL (ref 6–20)
CO2: 24 mmol/L (ref 22–32)
Calcium: 9.1 mg/dL (ref 8.9–10.3)
Chloride: 107 mmol/L (ref 98–111)
Creatinine: 0.78 mg/dL (ref 0.44–1.00)
GFR, Estimated: >60 mL/min (ref >60)
Glucose, Bld: 81 mg/dL (ref 70–99)
Potassium: 3.9 mmol/L (ref 3.5–5.1)
Sodium: 137 mmol/L (ref 135–145)
Total Bilirubin: 0.4 mg/dL (ref 0.3–1.2)
Total Protein: 7.2 g/dL (ref 6.5–8.1)

## 2021-01-20 LAB — IRON AND IRON BINDING CAPACITY (CC-WL,HP ONLY)
Iron: 23 ug/dL — ABNORMAL LOW (ref 28–170)
Saturation Ratios: 5 % — ABNORMAL LOW (ref 10.4–31.8)
TIBC: 505 ug/dL — ABNORMAL HIGH (ref 250–450)
UIBC: 482 ug/dL — ABNORMAL HIGH (ref 148–442)

## 2021-01-20 LAB — FOLATE: Folate: 13.7 ng/mL (ref >5.9)

## 2021-01-20 LAB — FERRITIN: Ferritin: 11 ng/mL (ref 11–307)

## 2021-01-20 LAB — HEPATITIS B SURFACE ANTIGEN: Hepatitis B Surface Ag: NONREACTIVE

## 2021-01-20 LAB — IMMATURE PLATELET FRACTION: Immature Platelet Fraction: 20.4 % — ABNORMAL HIGH (ref 1.2–8.6)

## 2021-01-20 LAB — HEPATITIS B CORE ANTIBODY, TOTAL: Hep B Core Total Ab: NONREACTIVE

## 2021-01-20 LAB — HEPATITIS B SURFACE ANTIBODY,QUALITATIVE: Hep B S Ab: REACTIVE — AB

## 2021-01-20 LAB — HEPATITIS C ANTIBODY: HCV Ab: NONREACTIVE

## 2021-01-20 LAB — VITAMIN B12: Vitamin B-12: 478 pg/mL (ref 180–914)

## 2021-01-20 NOTE — Progress Notes (Signed)
[place] [phone]   Fax:([phone]) [place] NOTE  Patient Care Team: Laurey Morale, MD as PCP - General Lorenso Courier Verita Lamb, MD as Consulting Physician (Hematology and Oncology)   CHIEF COMPLAINTS/PURPOSE OF CONSULTATION:  Iron deficiency anemia and thrombocytopenia  Hematological History:  #Iron deficiency anemia: -Patient was seen by [facility] Hematology team from 2104-2020 with Dr. Lamonte Sakai and then Dr. Alvy Bimler.  -07/27/2012: Received IV feraheme 1020 mg dose x 1. Developed hives several days later.  -Currently on oral iron 325 mg once daily  #Thrombocytopenia, likely ITP: ---Received steroid therapy during her pregnancy in 2020.    HISTORY OF PRESENTING ILLNESS:  Melinda [name] 36 y.o. female presents to clinic to reestablish care in the hematology department for iron deficiency anemia and thrombocytopenia. She is unaccompanied for this visit.   On exam today, Melinda [name] reports energy levels have been depleted.  She is able to do her daily activities but requires frequent resting.  Patient denies any dietary restrictions.  She denies any appetite changes or weight loss.  She ports intermittent episodes of nausea without any vomiting episodes.  Patient continues to have chronic heavy menstrual bleeding that occurs once a month.  The menstrual cycle lasts approximately 7 days with 3 to 4 days of heavy bleeding.  During the days of heavy bleeding, patient needs to change her pads every 30 to 60 minutes. She currently takes iron pills once a day for several months and recently resumed oral birth control pills last month. Her bowel habits are regular without diarrhea or constipation.  She reports shortness of breath mainly with exertion but not at rest.  She denies fevers, chills, night sweats, chest pain or cough.  She has no other complaints.  Rest of the 10 point ROS is below.  MEDICAL HISTORY:  Past Medical History:  Diagnosis Date   Anemia     Chickenpox    Early satiety    Herpes genitalia    HPV (human papilloma virus) anogenital infection    Iron deficiency anemia    Night sweat    Scarring, keloid    external genitalia   Thrombocytopenia (HCC)    Vaginal Pap smear, abnormal    Weight loss, non-intentional     SURGICAL HISTORY: Past Surgical History:  Procedure Laterality Date   CESAREAN SECTION N/A 09/20/2018   Procedure: CESAREAN SECTION;  Surgeon: Gwynne Edinger, MD;  Location: [facility] LD ORS;  Service: Obstetrics;  Laterality: N/A;   WISDOM TOOTH EXTRACTION      SOCIAL HISTORY: Social History   Socioeconomic History   Marital status: Single    Spouse name: Not on file   Number of children: 0   Years of education: Not on file   Highest education level: Master's degree (e.g., MA, MS, MEng, MEd, MSW, MBA)  Occupational History    Employer: [employer]    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[employer]; Pharmacist, hospital  Tobacco Use   Smoking status: Never   Smokeless tobacco: Never  Vaping Use   Vaping Use: Never used  Substance and Sexual Activity   Alcohol use: No    Alcohol/week: 0.0 standard drinks   Drug use: No   Sexual activity: Not Currently    Birth control/protection: None  Other Topics Concern   Not on file  Social History Narrative   Not on file   Social Determinants of Health   Financial Resource Strain: Not on file  Food Insecurity: Not on file  Transportation  Needs: Not on file  Physical Activity: Not on file  Stress: Not on file  Social Connections: Not on file  Intimate Partner Violence: Not on file    FAMILY HISTORY: Family History  Problem Relation Age of Onset   Hypertension Maternal Grandmother    Diabetes Maternal Grandmother    Hypertension Mother    Other Father        Leukopenia.   Hypertension Maternal Uncle     ALLERGIES:  has No Known Allergies.  MEDICATIONS:  Current Outpatient Medications  Medication Sig Dispense Refill   acetaminophen (TYLENOL) 325 MG tablet  Take 2 tablets (650 mg total) by mouth every 4 (four) hours as needed for up to 30 doses for mild pain. 30 tablet 1   Norgestimate-Ethinyl Estradiol Triphasic 0.18/0.215/0.25 MG-25 MCG tab Take 1 tablet by mouth daily. 1 Package 11   Prenat-Fe Carbonyl-FA-Omega 3 (ONE-A-DAY WOMENS PRENATAL 1) 28-0.8-235 MG CAPS Take 1 capsule by mouth daily. 30 capsule 0   valACYclovir (VALTREX) 500 MG tablet Take 1 tablet (500 mg total) by mouth 2 (two) times daily. 60 tablet 1   ferrous sulfate 325 (65 FE) MG tablet Take 1 tablet (325 mg total) by mouth daily. 30 tablet 3   oxyCODONE-acetaminophen (PERCOCET) 5-325 MG tablet Take 1-2 tablets by mouth every 6 (six) hours as needed for up to 8 doses for severe pain. (Patient not taking: Reported on 11/02/2018) 8 tablet 0   No current facility-administered medications for this visit.    REVIEW OF SYSTEMS:   Constitutional: ( - ) fevers, ( - )  chills , ( - ) night sweats Eyes: ( - ) blurriness of vision, ( - ) double vision, ( - ) watery eyes Ears, nose, mouth, throat, and face: ( - ) mucositis, ( - ) sore throat Respiratory: ( - ) cough, ( + ) dyspnea, ( - ) wheezes Cardiovascular: ( - ) palpitation, ( - ) chest discomfort, ( - ) lower extremity swelling Gastrointestinal:  ( + ) nausea, ( - ) heartburn, ( - ) change in bowel habits Skin: ( - ) abnormal skin rashes Lymphatics: ( - ) new lymphadenopathy, ( - ) easy bruising Neurological: ( - ) numbness, ( - ) tingling, ( - ) new weaknesses Behavioral/Psych: ( - ) mood change, ( - ) new changes  All other systems were reviewed with the patient and are negative.  PHYSICAL EXAMINATION: ECOG PERFORMANCE STATUS: 1 - Symptomatic but completely ambulatory  Vitals:   01/20/21 1118  BP: 133/60  Pulse: 92  Resp: 17  Temp: (!) 97.5 F (36.4 C)  SpO2: 100%   Filed Weights   01/20/21 1118  Weight: 139 lb 7 oz (63.2 kg)    GENERAL: well appearing female in NAD  SKIN: skin color, texture, turgor are normal, no  rashes or significant lesions EYES: conjunctiva are pink and non-injected, sclera clear OROPHARYNX: no exudate, no erythema; lips, buccal mucosa, and tongue normal  NECK: supple, non-tender LYMPH:  no palpable lymphadenopathy in the cervical or supraclavicular lymph nodes.  LUNGS: clear to auscultation and percussion with normal breathing effort HEART: regular rate & rhythm and no murmurs and no lower extremity edema ABDOMEN: soft, non-tender, non-distended, normal bowel sounds Musculoskeletal: no cyanosis of digits and no clubbing  PSYCH: alert & oriented x 3, fluent speech NEURO: no focal motor/sensory deficits  LABORATORY DATA:  I have reviewed the data as listed CBC Latest Ref Rng & Units 10/06/2018 09/23/2018 09/21/2018  WBC 3.4 -  10.8 x10E3/uL 2.5(LL) 6.6 14.5(H)  Hemoglobin 11.1 - 15.9 g/dL 11.5 9.2(L) 9.0(L)  Hematocrit 34.0 - 46.6 % 35.1 28.9(L) 29.1(L)  Platelets 150 - 450 x10E3/uL 120(L) 80(L) 85(L)    CMP Latest Ref Rng & Units 09/19/2018 09/05/2018 05/10/2018  Glucose 70 - 99 mg/dL 103(H) 90 78  BUN 6 - 20 mg/dL 8 7 11   Creatinine 0.44 - 1.00 mg/dL 0.75 0.68 0.59  Sodium 135 - 145 mmol/L 138 136 137  Potassium 3.5 - 5.1 mmol/L 3.6 3.7 3.6  Chloride 98 - 111 mmol/L 108 108 110  CO2 22 - 32 mmol/L 21(L) 19(L) 19(L)  Calcium 8.9 - 10.3 mg/dL 8.8(L) 8.4(L) 7.7(L)  Total Protein 6.5 - 8.1 g/dL 6.2(L) 5.9(L) 5.2(L)  Total Bilirubin 0.3 - 1.2 mg/dL 0.4 0.5 0.3  Alkaline Phos 38 - 126 U/L 138(H) 132(H) 42  AST 15 - 41 U/L 17 16 16   ALT 0 - 44 U/L 28 13 14    ASSESSMENT & PLAN Melinda [name] is a 36 y.o. 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   #Iron deficiency anemia 2/2 heavy menstrual bleeding: --Currently on ferrous sulfate 325 mg once daily. Recommend to continue with a source of vitamin C. --Provided list of iron rich foods to incorporate into diet. --Patient is scheduled for a consultation with gynecologist next  week to discuss methods to improvement menstrual bleeding --Currently on oral contraceptive that she started last month --Labs today to check CBC, CMP, iron and TIBC, ferritin and retic panel --If there is persistent iron deficiency anemia with Hgb <10, recommend to proceed with IV iron infusions. Try to arrange for IV monoferric 1000 mg x 1 dose at [employer] [facility] infusion.   #Thrombocytopenia, likely ITP: --Patient received steroids during her pregnancy in 2020 with improvement of platelet counts. Otherwise, she has not received additional therapy --Labs today to check nutritional deficiencies, immature platelet fraction and hepatitis serologies.  --Patient denies signs of bleeding except for menstrual cycle.   Follow up: --RTC in 3 months with repeat labs.   Orders Placed This Encounter  Procedures   CBC with Differential ([place] Only)    Standing Status:   Future    Number of Occurrences:   1    Standing Expiration Date:   01/20/2022   Ferritin    Standing Status:   Future    Number of Occurrences:   1    Standing Expiration Date:   01/20/2022   Iron and Iron Binding Capacity ([facility] only)    Standing Status:   Future    Number of Occurrences:   1    Standing Expiration Date:   01/20/2022   Retic Panel    Standing Status:   Future    Number of Occurrences:   1    Standing Expiration Date:   01/20/2022   Immature Platelet Fraction    Standing Status:   Future    Number of Occurrences:   1    Standing Expiration Date:   01/20/2022   Vitamin B12    Standing Status:   Future    Number of Occurrences:   1    Standing Expiration Date:   01/20/2022   Methylmalonic acid, serum    Standing Status:   Future    Number of Occurrences:   1    Standing Expiration Date:   01/20/2022   Folate, Serum    Standing Status:   Future  Number of Occurrences:   1    Standing Expiration Date:   01/20/2022   Hepatitis C antibody    Standing Status:   Future    Number of Occurrences:   1     Standing Expiration Date:   01/20/2022   Hepatitis B surface antigen    Standing Status:   Future    Number of Occurrences:   1    Standing Expiration Date:   01/20/2022   Hepatitis B surface antibody    Standing Status:   Future    Number of Occurrences:   1    Standing Expiration Date:   01/20/2022   Hepatitis B core antibody, total    Standing Status:   Future    Number of Occurrences:   1    Standing Expiration Date:   01/20/2022   CMP ([place] only)    Standing Status:   Future    Number of Occurrences:   1    Standing Expiration Date:   01/20/2022    All questions were answered. The patient knows to call the clinic with any problems, questions or concerns.  I have spent a total of 45 minutes minutes of face-to-face and non-face-to-face time, preparing to see the patient, obtaining and/or reviewing separately obtained history, performing a medically appropriate examination, counseling and educating the patient, ordering tests/medications, documenting clinical information in the electronic health record, and care coordination.   Dede Query, PA-C Department of Hematology/Oncology [place] at [facility] Phone: [phone]  Patient was seen with Dr. Lorenso Courier.   I have read the above note and personally examined the patient. I agree with the assessment and plan as noted above.  Briefly Melinda [name] is a 36 year old female who presents for evaluation of iron deficiency anemia and possible ITP. She has thrombocytopenia in 2020 and received steroids while under the care of Dr. Alvy Bimler. Her platelets increased from 73 on 06/16/2018 up to 116 on 09/20/2018. At last check the patient had continued thrombocytopenia. Additionally she has iron deficiency anemia 2/2 to GYN bleeding. If iron levels/Hgb are failing to improve on PO iron therapy will proceed with IV iron treatments. Plan for RTC in 3 months with labs or sooner if IV iron therapy is required. No  indication for treatment of ITP at this time.    Ledell Peoples, MD Department of Hematology/Oncology [place] at [facility] Phone: [phone] Pager: [phone] Email: [email or web address]

## 2021-01-20 NOTE — Telephone Encounter (Signed)
Dr. Mariane Baumgarten,  Lorain Childes note: Auth Submission: no auth needed Payer: [employer] MEDICAID Medication & CPT/J Code(s) submitted: MONOFERRIC (W0981) Route of submission (phone, fax, portal): PHONE Auth type: Buy/Bill Units/visits requested: 1 Reference number: [id number] Approval from: 01/20/21 to 04/20/21   Patient will be scheduled as soon as possible.

## 2021-01-21 ENCOUNTER — Telehealth: Payer: Self-pay | Admitting: Physician Assistant

## 2021-01-21 NOTE — Telephone Encounter (Signed)
Scheduled per 1/10 los, pt has been called and confirmed

## 2021-01-23 ENCOUNTER — Encounter: Payer: Self-pay | Admitting: Physician Assistant

## 2021-01-23 ENCOUNTER — Telehealth: Payer: Self-pay | Admitting: Physician Assistant

## 2021-01-24 LAB — METHYLMALONIC ACID, SERUM: Methylmalonic Acid, Quantitative: 117 nmol/L (ref 0–378)

## 2021-01-26 ENCOUNTER — Encounter: Payer: Self-pay | Admitting: Physician Assistant

## 2021-01-26 NOTE — Telephone Encounter (Signed)
I called Melinda [name] to review the lab results from 1/101/2023. Results indiciate iron deficiency anemia and mild thrombocytopenia. Since patient is currently on iron pills, we will arrange for IV monoferric 1000 mg x 1 dose, scheduled on 01/30/2021.   Regarding her thrombocytopenia, the workup ruled out nutritional deficiencies, hepatitis B and C and HIV. Increased immature platelet fraction suggest ITP as underlying cause. Recommend to monitor and gave strict precautions for bleeding.   We will see patient back in 3 months with repeat labs. She expressed understanding and satisfaction with the plan provided.

## 2021-01-28 IMAGING — US US MFM OB FOLLOW UP
1 series · 13 of 28 positions shown · non-contrast
Comparison: none

[Series 1: us mfm ob follow up · 13 of 63 slices shown]
[im 3/63]
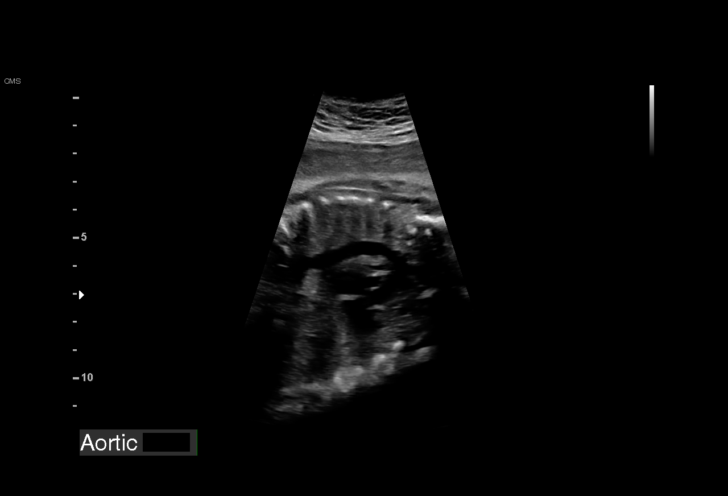
[im 7/63]
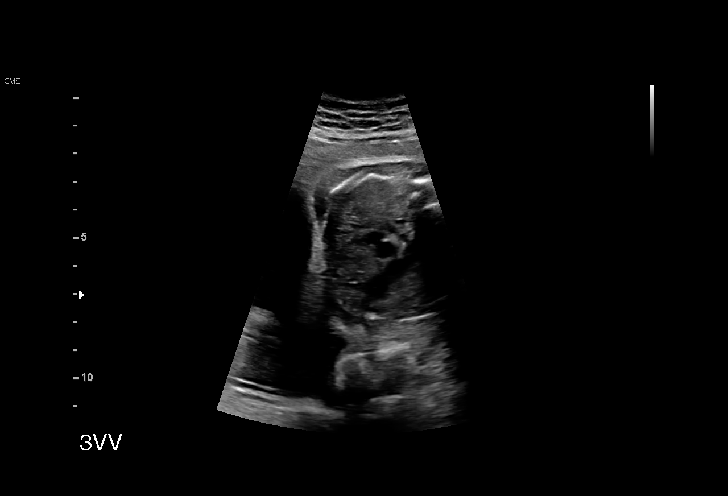
[im 12/63]
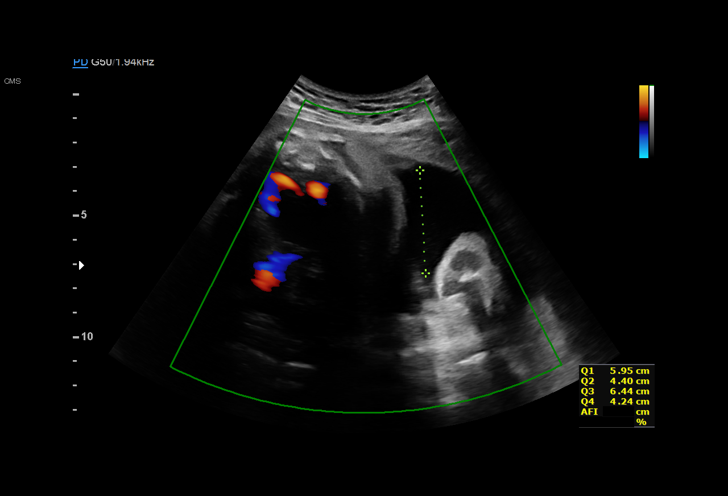
[im 17/63]
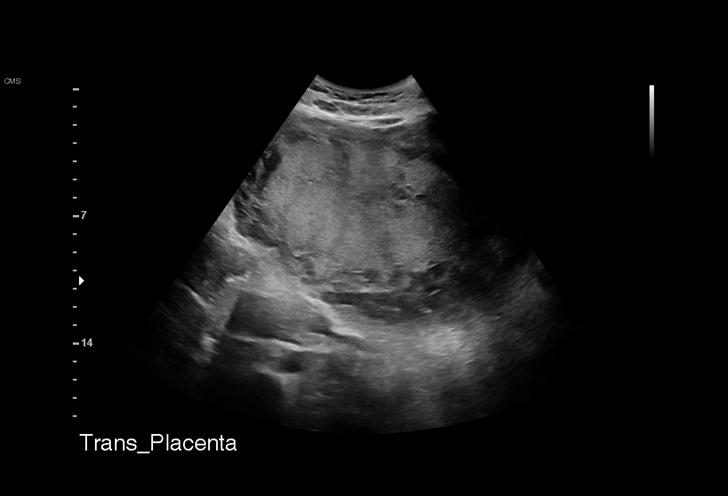
[im 21/63]
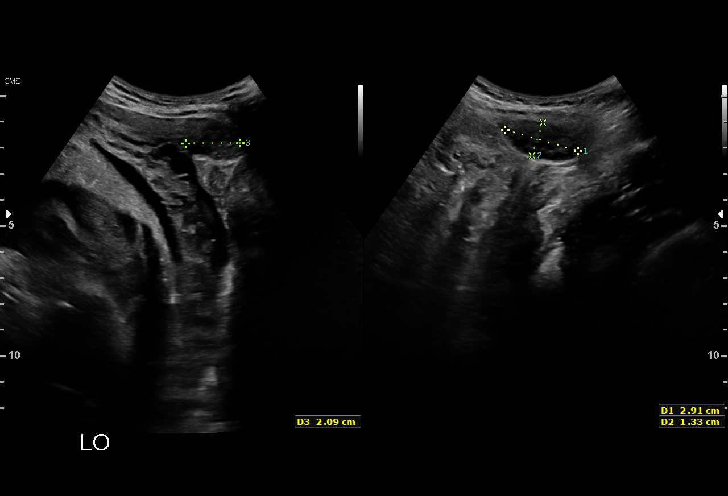
[im 26/63]
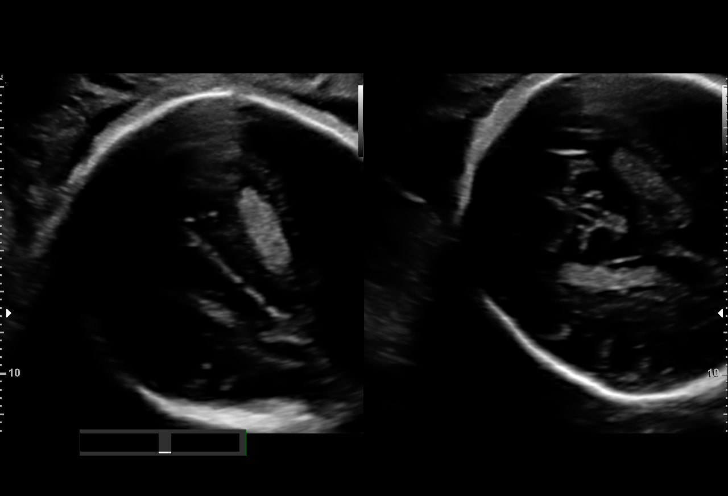
[im 33/63]
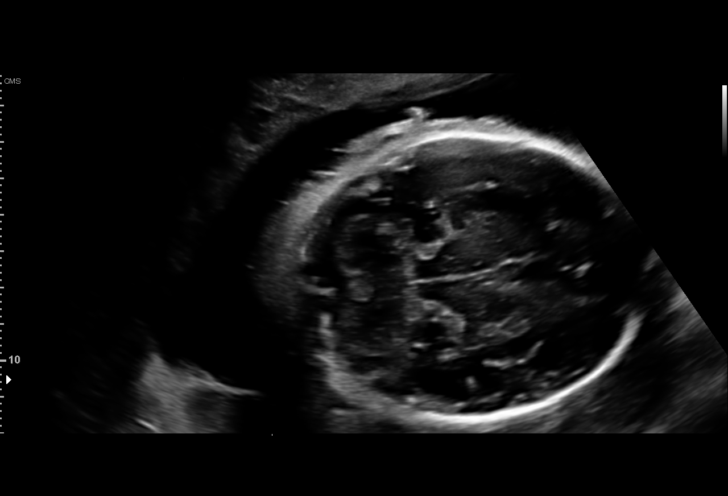
[im 37/63]
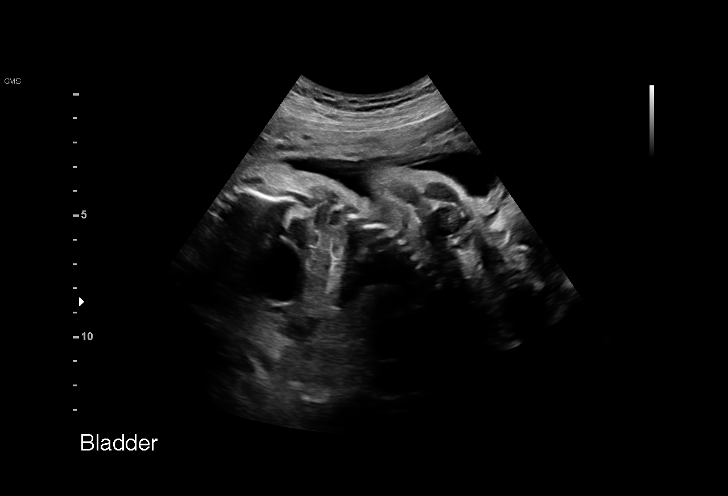
[im 42/63]
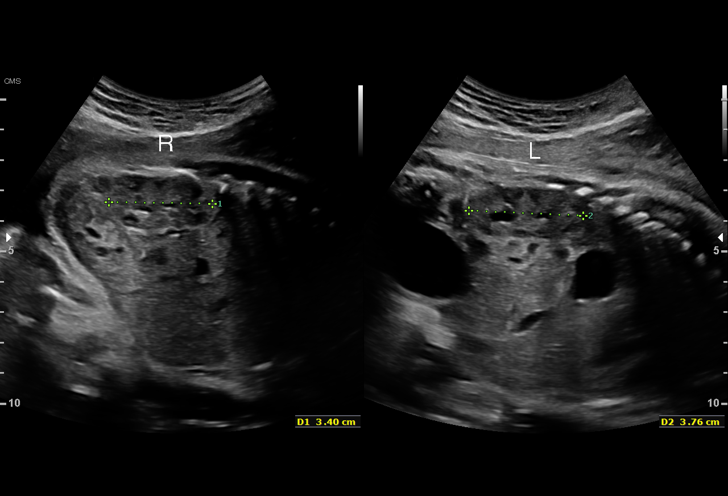
[im 46/63]
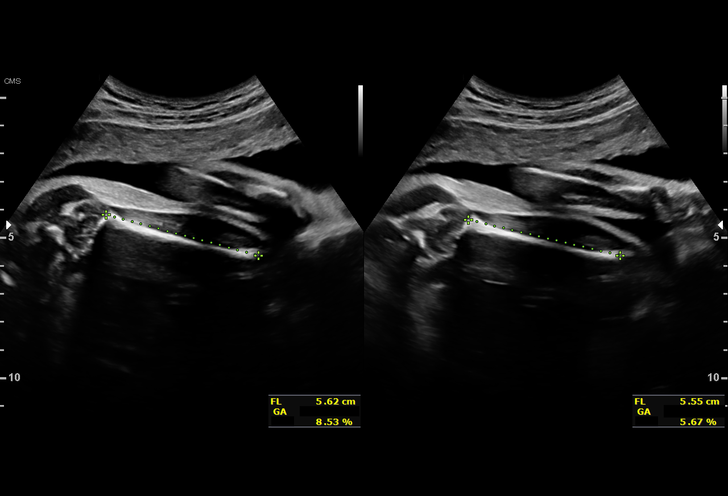
[im 51/63]
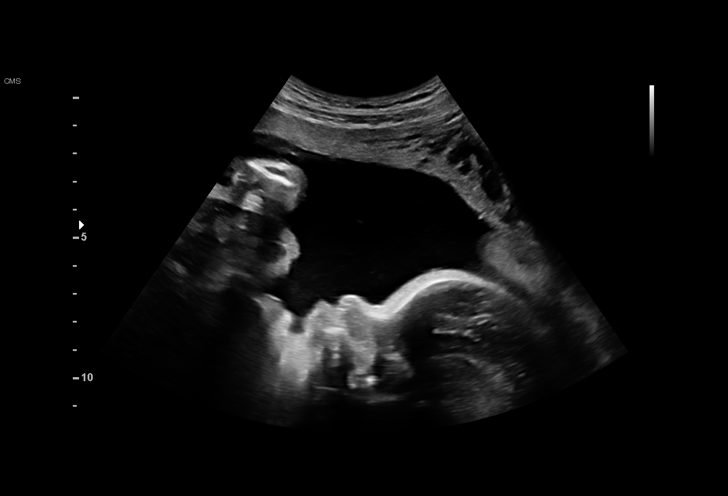
[im 56/63]
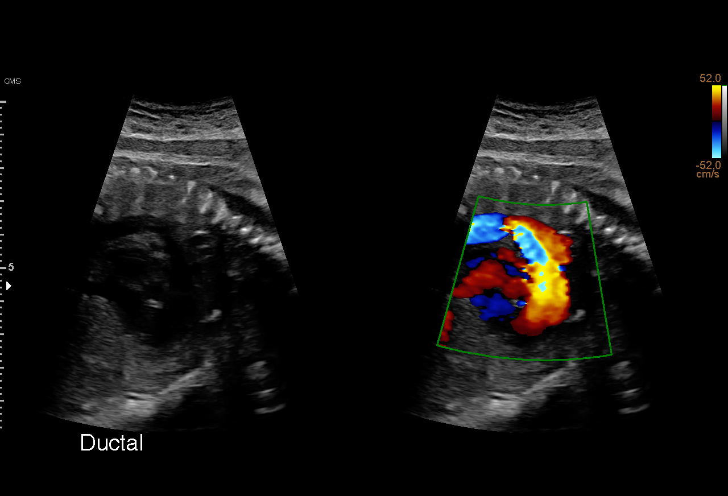
[im 60/63]
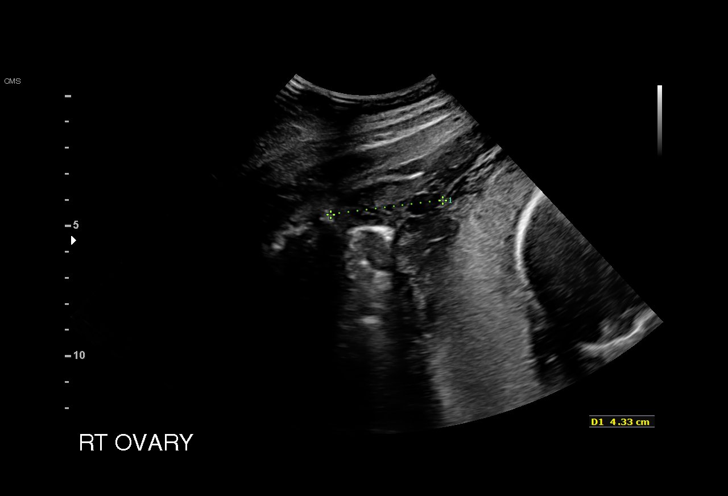

[13 of 28 positions shown; findings below may reference images not displayed]

MFATIH
 ----------------------------------------------------------------------

 ----------------------------------------------------------------------
Indications

  Late to prenatal care, third trimester
  Insufficient Prenatal Care
  Antenatal follow-up for nonvisualized fetal
  anatomy
  30 weeks gestation of pregnancy
 ----------------------------------------------------------------------
Fetal Evaluation

 Num Of Fetuses:          1
 Fetal Heart Rate(bpm):   171
 Cardiac Activity:        Observed
 Presentation:            Cephalic
 Placenta:                Posterior
 P. Cord Insertion:       Visualized

 Amniotic Fluid
 AFI FV:      Within normal limits

 AFI Sum(cm)     %Tile       Largest Pocket(cm)
 21.03           82

 RUQ(cm)       RLQ(cm)       LUQ(cm)        LLQ(cm)

Biometry

 BPD:        80  mm     G. Age:  32w 1d         77  %    CI:        71.61   %    70 - 86
                                                         FL/HC:       18.7  %    19.3 -
 HC:       301   mm     G. Age:  33w 3d         82  %    HC/AC:       1.11       0.96 -
 AC:      270.3  mm     G. Age:  31w 1d         55  %    FL/BPD:      70.3  %    71 - 87
 FL:       56.2  mm     G. Age:  29w 4d          9  %    FL/AC:       20.8  %    20 - 24
 HUM:      50.2  mm     G. Age:  29w 3d         22  %

 Est. FW:    4666   gm   3 lb 11 oz      40  %
OB History

 Gravidity:    1         Term:   0        Prem:   0        SAB:   0
 TOP:          0       Ectopic:  0        Living: 0
Gestational Age

 LMP:           29w 3d        Date:  12/25/17                 EDD:   10/01/18
 U/S Today:     31w 4d                                        EDD:   09/16/18
 Best:          30w 6d     Det. By:  U/S  (06/21/18)          EDD:   09/21/18
Anatomy

 Cranium:               Appears normal         Aortic Arch:            Appears normal
 Cavum:                 Appears normal         Ductal Arch:            Appears normal
 Ventricles:            Appears normal         Diaphragm:              Previously seen
 Choroid Plexus:        Appears normal         Stomach:                Appears normal, left
                                                                       sided
 Cerebellum:            Appears normal         Abdomen:                Appears normal
 Posterior Fossa:       Appears normal         Abdominal Wall:         Previously seen
 Nuchal Fold:           Not applicable (>20    Cord Vessels:           Previously seen
                        wks GA)
 Face:                  Orbits and profile     Kidneys:                Appear normal
                        previously seen
 Lips:                  Appears normal         Bladder:                Appears normal
 Thoracic:              Appears normal         Spine:                  Previously seen
 Heart:                 Appears normal         Upper Extremities:      Previously seen
                        (4CH, axis, and
                        situs)
 RVOT:                  Previously seen        Lower Extremities:      Previously seen
 LVOT:                  Previously seen

 Other:  Nasal bone visualized. Technically difficult due to advanced
         gestational age.
Cervix Uterus Adnexa

 Cervix
 Normal appearance by transabdominal scan.

 Uterus
 No abnormality visualized.

 Left Ovary
 Within normal limits.

 Right Ovary
 Within normal limits.

 Cul De Sac
 No free fluid seen.

 Adnexa
 No abnormality visualized.
Impression

 Late prenatal care. Patient returned for fetal growth
 assessment. She does not have gestational diabetes.

 Amniotic fluid is normal and good fetal activity is seen. Fetal
 growth is appropriate for gestational age. Good interval
 growth is seen. Cardiac anatomy appears normal.
Recommendations

 Follow-up scans as clinically indicated.
                 Tiger, Keyli

## 2021-01-29 DIAGNOSIS — N920 Excessive and frequent menstruation with regular cycle: Secondary | ICD-10-CM | POA: Diagnosis present

## 2021-01-30 ENCOUNTER — Ambulatory Visit (INDEPENDENT_AMBULATORY_CARE_PROVIDER_SITE_OTHER): Payer: Medicaid Other

## 2021-01-30 ENCOUNTER — Other Ambulatory Visit: Payer: Self-pay

## 2021-01-30 VITALS — BP 149/85 | HR 85 | Temp 98.4°F | Resp 18 | Ht 61.0 in | Wt 140.2 lb

## 2021-01-30 DIAGNOSIS — D500 Iron deficiency anemia secondary to blood loss (chronic): Secondary | ICD-10-CM | POA: Diagnosis not present

## 2021-01-30 MED ORDER — SODIUM CHLORIDE 0.9 % IV SOLN: Freq: Once | INTRAVENOUS | Status: DC | PRN

## 2021-01-30 MED ORDER — SODIUM CHLORIDE 0.9 % IV SOLN
1000.0000 mg | Freq: Once | INTRAVENOUS | Status: AC
  Administered 2021-01-30: 1000 mg via INTRAVENOUS
  Filled 2021-01-30: qty 10

## 2021-01-30 MED ORDER — METHYLPREDNISOLONE SODIUM SUCC 125 MG IJ SOLR: 125.0000 mg | Freq: Once | INTRAMUSCULAR | Status: DC | PRN

## 2021-01-30 MED ORDER — ALBUTEROL SULFATE HFA 108 (90 BASE) MCG/ACT IN AERS: 2.0000 | INHALATION_SPRAY | Freq: Once | RESPIRATORY_TRACT | Status: DC | PRN

## 2021-01-30 MED ORDER — DIPHENHYDRAMINE HCL 50 MG/ML IJ SOLN: 50.0000 mg | Freq: Once | INTRAMUSCULAR | Status: DC | PRN

## 2021-01-30 MED ORDER — EPINEPHRINE 0.3 MG/0.3ML IJ SOAJ: 0.3000 mg | Freq: Once | INTRAMUSCULAR | Status: DC | PRN

## 2021-01-30 MED ORDER — FAMOTIDINE IN NACL 20-0.9 MG/50ML-% IV SOLN: 20.0000 mg | Freq: Once | INTRAVENOUS | Status: DC | PRN

## 2021-01-30 NOTE — Progress Notes (Signed)
Diagnosis: Iron Deficiency Anemia  Provider:  Chilton Greathouse, MD  Procedure: Infusion  IV Type: Peripheral, IV Location: L Antecubital  Monoferric, Dose: 1000 mg  Infusion Start Time: 0857  Infusion Stop Time: 0920  Post Infusion IV Care: Observation period completed and Peripheral IV Discontinued  Discharge: Condition: Good, Destination: Home . AVS provided to patient.   Performed by:  Jerzi Tigert, Lyman Speller, LPN

## 2021-03-05 ENCOUNTER — Encounter: Payer: Self-pay | Admitting: Emergency Medicine

## 2021-03-05 ENCOUNTER — Ambulatory Visit
Admission: EM | Admit: 2021-03-05 | Discharge: 2021-03-05 | Disposition: A | Discharge status: Home / Self Care | Payer: Medicaid Other | Attending: Physician Assistant | Admitting: Physician Assistant

## 2021-03-05 DIAGNOSIS — K047 Periapical abscess without sinus: Secondary | ICD-10-CM | POA: Diagnosis not present

## 2021-03-05 MED ORDER — AMOXICILLIN 500 MG PO CAPS: 500.0000 mg | ORAL_CAPSULE | Freq: Three times a day (TID) | ORAL | 0 refills | Status: DC

## 2021-03-05 NOTE — ED Triage Notes (Signed)
Triaged by provider  

## 2021-03-05 NOTE — ED Provider Notes (Signed)
[place]    CSN: [id number] Arrival date & time: 03/05/21  1936      History   Chief Complaint Chief Complaint  Patient presents with   Dental Pain    HPI Melinda [name] is a 36 y.o. female.   Patient here today for evaluation of possible dental abscess. She reports pain to her right lower molars. Cold and hot foods and liquids seem to exacerbate pain. She reports no pain to the tooth itself, only to surrounding gum area. She has felt some feverish and chills. She has tried OTC meds with temporary relief but no resolution.   The history is provided by the patient.   Past Medical History:  Diagnosis Date   Anemia    Chickenpox    Early satiety    Herpes genitalia    HPV (human papilloma virus) anogenital infection    Iron deficiency anemia    Night sweat    Scarring, keloid    external genitalia   Thrombocytopenia (HCC)    Vaginal Pap smear, abnormal    Weight loss, non-intentional     Patient Active Problem List   Diagnosis Date Noted   Iron deficiency anemia due to chronic blood loss 01/20/2021   Neutropenia ([place]) 11/02/2018   Delivery of pregnancy by cesarean section 10/06/2018   Encounter for post surgical wound check 10/06/2018   Herpes simplex infection in mother during third trimester of pregnancy 08/31/2018   Uterine size date discrepancy pregnancy, third trimester 08/24/2018   Thrombocytopenia affecting pregnancy ([place]) 06/17/2018   Supervision of normal first pregnancy, antepartum 06/15/2018   Cervical high risk HPV (human papillomavirus) test positive 02/24/2017   Herpes simplex 09/11/2013   Multinodular goiter 07/27/2012   Thrombocytopenia (HCC)    Anemia    Iron deficiency anemia    CHICKENPOX, HX OF 04/10/2007    Past Surgical History:  Procedure Laterality Date   CESAREAN SECTION N/A 09/20/2018   Procedure: CESAREAN SECTION;  Surgeon: Gwynne Edinger, MD;  Location: [facility];  Service: Obstetrics;  Laterality: N/A;   WISDOM  TOOTH EXTRACTION      OB History     Gravida  1   Para  1   Term  1   Preterm      AB      Living  1      SAB      IAB      Ectopic      Multiple  0   Live Births  1            Home Medications    Prior to Admission medications   Medication Sig Start Date End Date Taking? Authorizing Provider  amoxicillin (AMOXIL) 500 MG capsule Take 1 capsule (500 mg total) by mouth 3 (three) times daily. 03/05/21  Yes Francene Finders, PA-C  acetaminophen (TYLENOL) 325 MG tablet Take 2 tablets (650 mg total) by mouth every 4 (four) hours as needed for up to 30 doses for mild pain. 09/23/18   Nugent, Gerrie Nordmann, NP  ferrous sulfate 325 (65 FE) MG tablet Take 1 tablet (325 mg total) by mouth daily. 09/23/18 09/23/19  Nugent, Gerrie Nordmann, NP  Norgestimate-Ethinyl Estradiol Triphasic 0.18/0.215/0.25 MG-25 MCG tab Take 1 tablet by mouth daily. 11/02/18   Laury Deep, CNM  oxyCODONE-acetaminophen (PERCOCET) 5-325 MG tablet Take 1-2 tablets by mouth every 6 (six) hours as needed for up to 8 doses for severe pain. Patient not taking: Reported on 11/02/2018 09/23/18  Nugent, Gerrie Nordmann, NP  Prenat-Fe Carbonyl-FA-Omega 3 (ONE-A-DAY WOMENS PRENATAL 1) 28-0.8-235 MG CAPS Take 1 capsule by mouth daily. 06/16/18   Laury Deep, CNM  valACYclovir (VALTREX) 500 MG tablet Take 1 tablet (500 mg total) by mouth 2 (two) times daily. 08/24/18   Laury Deep, CNM    Family History Family History  Problem Relation Age of Onset   Hypertension Maternal Grandmother    Diabetes Maternal Grandmother    Hypertension Mother    Other Father        Leukopenia.   Hypertension Maternal Uncle     Social History Social History   Tobacco Use   Smoking status: Never   Smokeless tobacco: Never  Vaping Use   Vaping Use: Never used  Substance Use Topics   Alcohol use: No    Alcohol/week: 0.0 standard drinks   Drug use: No     Allergies   Patient has no known allergies.   Review of Systems Review  of Systems  Constitutional:  Positive for chills and fever.  HENT:  Positive for dental problem. Negative for facial swelling.   Eyes:  Negative for discharge and redness.  Gastrointestinal:  Negative for abdominal pain, nausea and vomiting.    Physical Exam Triage Vital Signs ED Triage Vitals  Enc Vitals Group     BP      Pulse      Resp      Temp      Temp src      SpO2      Weight      Height      Head Circumference      Peak Flow      Pain Score      Pain Loc      Pain Edu?      Excl. in [place]?    No data found.  Updated Vital Signs BP 111/78 (BP Location: Left Arm)    Pulse 79    Temp 98.1 F (36.7 C) (Oral)    Resp 18    SpO2 98%     Physical Exam Vitals and nursing note reviewed.  Constitutional:      General: She is not in acute distress.    Appearance: Normal appearance. She is not ill-appearing.  HENT:     Head: Normocephalic and atraumatic.     Mouth/Throat:     Comments: Erythema and swelling noted to posterior lower right gums Eyes:     Conjunctiva/sclera: Conjunctivae normal.  Cardiovascular:     Rate and Rhythm: Normal rate.  Pulmonary:     Effort: Pulmonary effort is normal.  Neurological:     Mental Status: She is alert.  Psychiatric:        Mood and Affect: Mood normal.        Behavior: Behavior normal.        Thought Content: Thought content normal.     UC Treatments / Results  Labs (all labs ordered are listed, but only abnormal results are displayed) Labs Reviewed - No data to display  EKG   Radiology No results found.  Procedures Procedures (including critical care time)  Medications Ordered in UC Medications - No data to display  Initial Impression / Assessment and Plan / UC Course  I have reviewed the triage vital signs and the nursing notes.  Pertinent labs & imaging results that were available during my care of the patient were reviewed by me and considered in my medical decision making (see chart  for details).     Will treat to cover dental abscess with amoxicillin. Recommended tylenol and ibuprofen if needed for pain. Encouraged follow up with dentist if no resolution.   Final Clinical Impressions(s) / UC Diagnoses   Final diagnoses:  Abscess, dental   Discharge Instructions   None    ED Prescriptions     Medication Sig Dispense Auth. Provider   amoxicillin (AMOXIL) 500 MG capsule Take 1 capsule (500 mg total) by mouth 3 (three) times daily. 21 capsule Francene Finders, PA-C      PDMP not reviewed this encounter.   Francene Finders, PA-C 03/06/21 [phone]

## 2021-03-11 IMAGING — US US MFM OB FOLLOW UP
1 series · 14 of 28 positions shown · non-contrast
Comparison: none

[Series 1: us mfm ob follow up · 29 acquisitions, 14 frames shown]
[im 2/29]
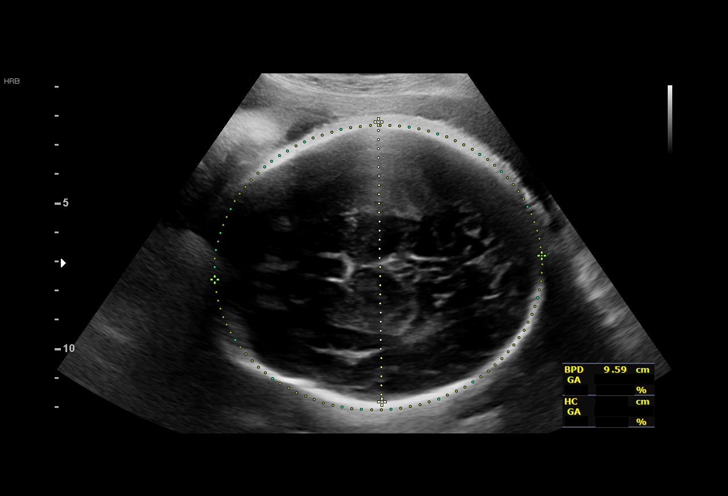
[im 4/29]
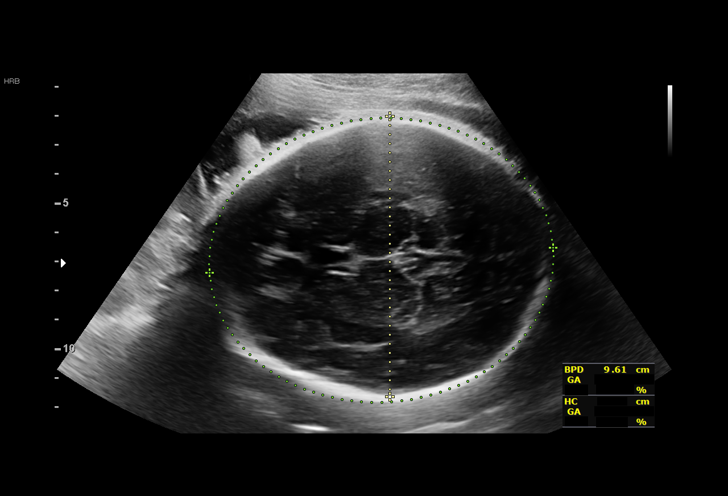
[im 6/29]
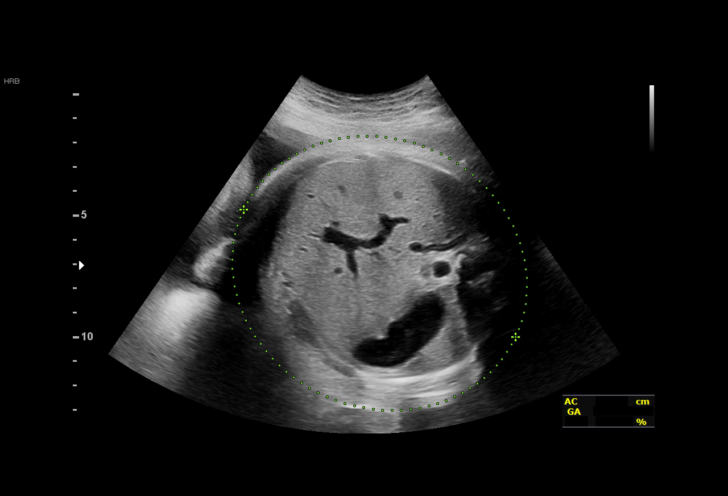
[im 8/29]
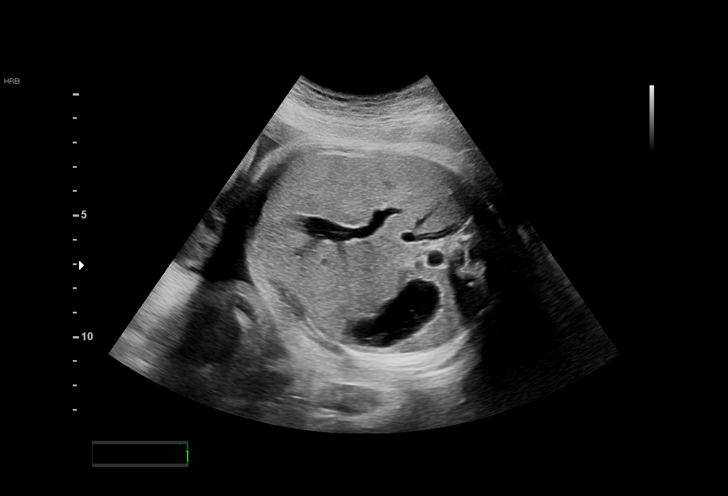
[im 10/29]
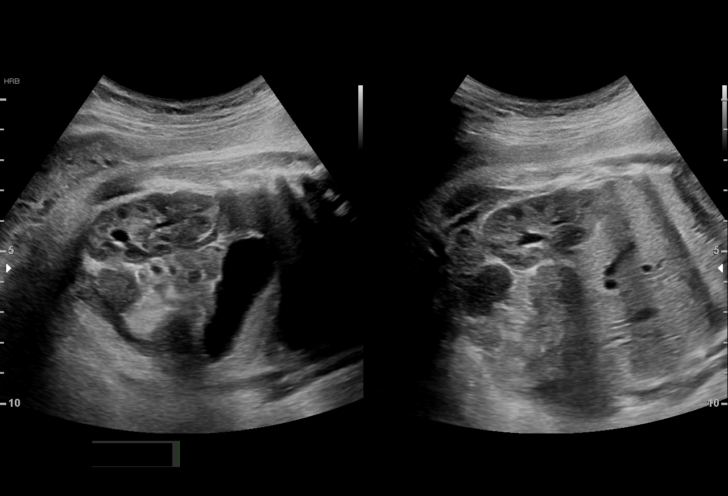
[im 12/29]
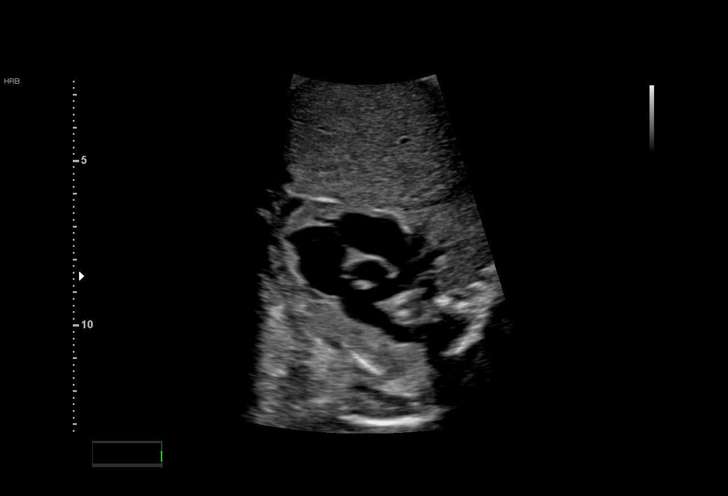
[im 14/29]
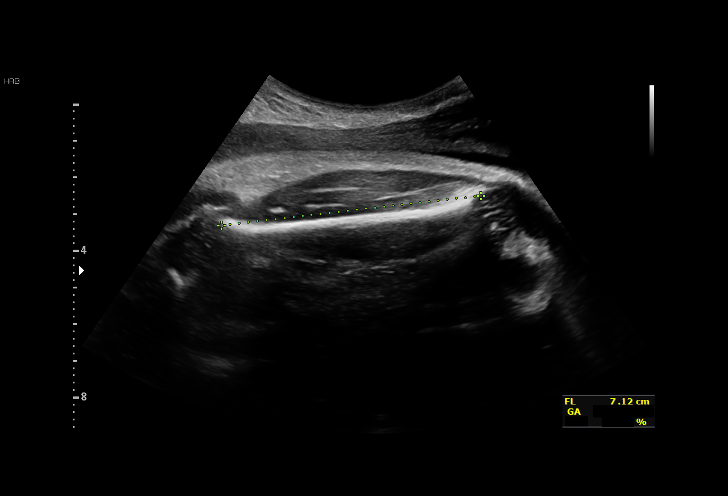
[im 16/29]
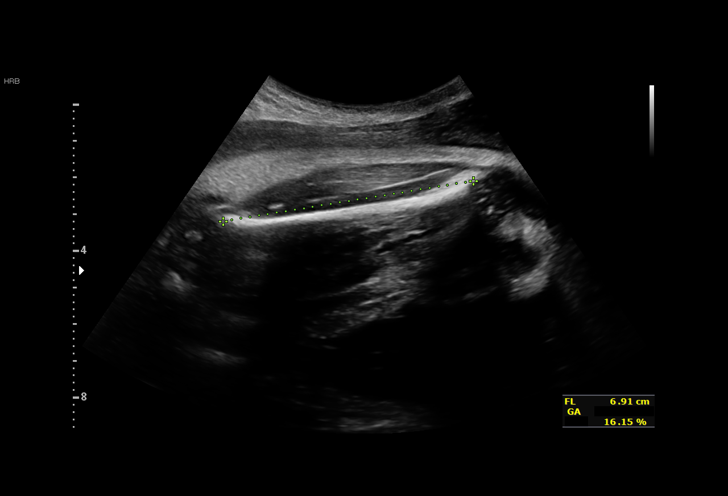
[im 18/29]
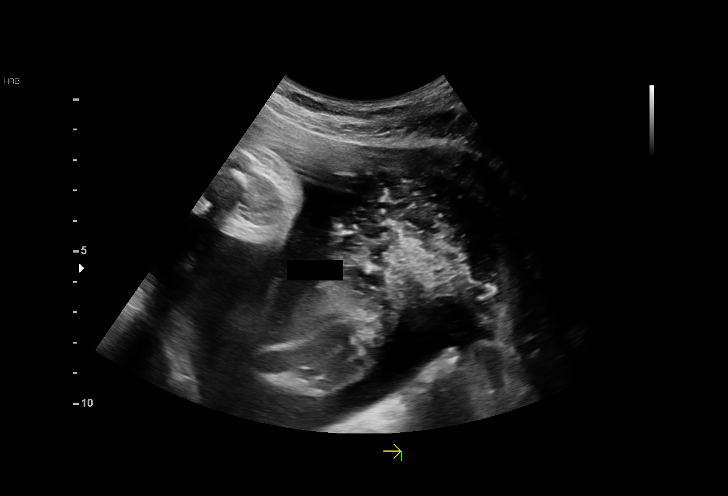
[im 20/29]
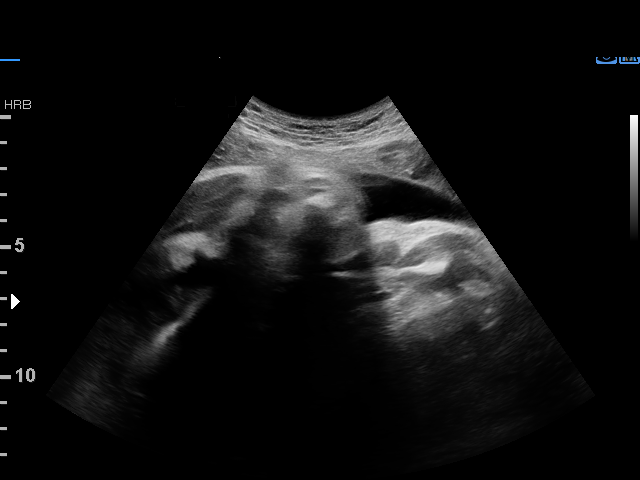
[im 22/29]
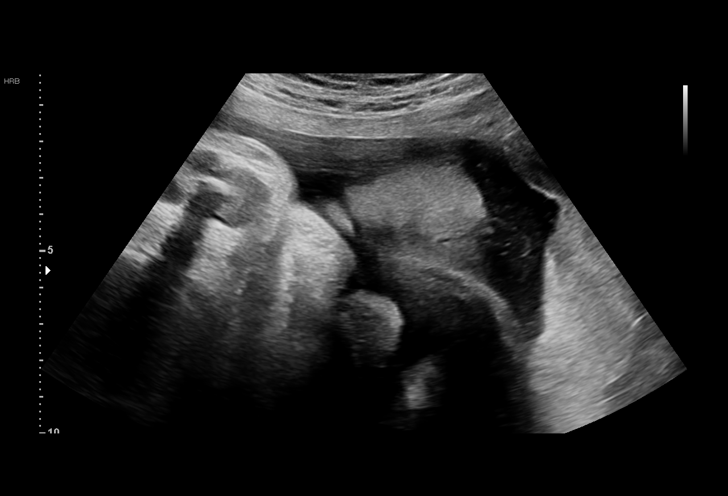
[im 24/29]
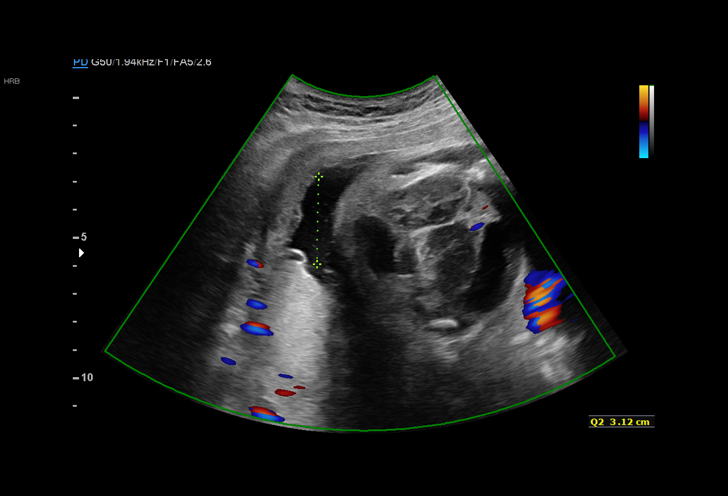
[im 26/29]
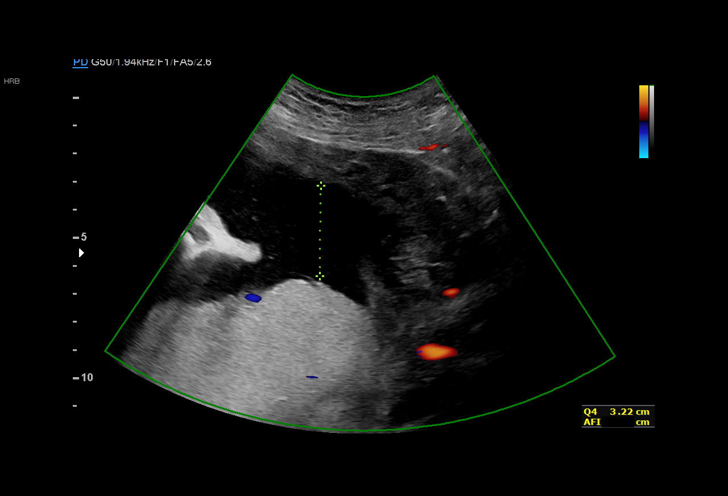
[im 29/29]
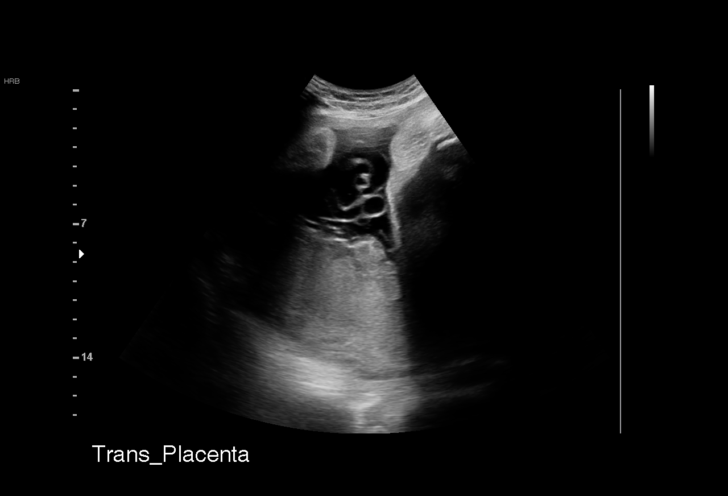

[14 of 28 positions shown; findings below may reference images not displayed]

----------------------------------------------------------------------

 ----------------------------------------------------------------------
Indications

  Late to prenatal care, third trimester
  Insufficient Prenatal Care
  Encounter for other antenatal screening
  follow-up
  36 weeks gestation of pregnancy
 ----------------------------------------------------------------------
Vital Signs

 BMI:
Fetal Evaluation

 Num Of Fetuses:          1
 Fetal Heart Rate(bpm):   149
 Cardiac Activity:        Observed
 Presentation:            Cephalic
 Placenta:                Posterior
 P. Cord Insertion:       Previously Visualized

 Amniotic Fluid
 AFI FV:      Within normal limits

 AFI Sum(cm)     %Tile       Largest Pocket(cm)
 13.12           47

 RUQ(cm)       RLQ(cm)       LUQ(cm)        LLQ(cm)

Biometry

 BPD:        96  mm     G. Age:  39w 1d         98  %    CI:        81.52   %    70 - 86
                                                         FL/HC:       20.9  %    20.8 -
 HC:      335.6  mm     G. Age:  38w 3d         62  %    HC/AC:       0.92       0.92 -
 AC:      364.7  mm     G. Age:  40w 3d       > 99  %    FL/BPD:      73.2  %    71 - 87
 FL:       70.3  mm     G. Age:  36w 0d         27  %    FL/AC:       19.3  %    20 - 24
 HUM:        60  mm     G. Age:  34w 6d         30  %

 Est. FW:    3680   gm     8 lb 2 oz     97  %
OB History

 Gravidity:    1         Term:   0        Prem:   0        SAB:   0
 TOP:          0       Ectopic:  0        Living: 0
Gestational Age

 LMP:           35w 3d        Date:  12/25/17                 EDD:   10/01/18
 U/S Today:     38w 4d                                        EDD:   09/09/18
 Best:          36w 6d     Det. By:  U/S  (06/21/18)          EDD:   09/21/18
Anatomy

 Cranium:               Appears normal         Aortic Arch:            Previously seen
 Cavum:                 Appears normal         Ductal Arch:            Previously seen
 Ventricles:            Previously seen        Diaphragm:              Appears normal
 Choroid Plexus:        Previously seen        Stomach:                Appears normal, left
                                                                       sided
 Cerebellum:            Previously seen        Abdomen:                Appears normal
 Posterior Fossa:       Previously seen        Abdominal Wall:         Previously seen
 Nuchal Fold:           Not applicable (>20    Cord Vessels:           Previously seen
                        wks GA)
 Face:                  Orbits and profile     Kidneys:                Appear normal
                        previously seen
 Lips:                  Previously seen        Bladder:                Appears normal
 Thoracic:              Appears normal         Spine:                  Previously seen
 Heart:                 Previously seen        Upper Extremities:      Previously seen
 RVOT:                  Appears normal         Lower Extremities:      Previously seen
 LVOT:                  Appears normal

 Other:  Nasal bone visualized. Technically difficult due to advanced
         gestational age.
Cervix Uterus Adnexa

 Cervix
 Not visualized (advanced GA >51wks)
Impression

 Normal interval growth.
Recommendations

 Follow up as clinically indicated.

## 2021-03-23 ENCOUNTER — Other Ambulatory Visit: Payer: Self-pay | Admitting: Pharmacy Technician

## 2021-04-27 ENCOUNTER — Other Ambulatory Visit: Payer: Self-pay | Admitting: Physician Assistant

## 2021-04-27 DIAGNOSIS — D696 Thrombocytopenia, unspecified: Secondary | ICD-10-CM

## 2021-04-27 DIAGNOSIS — D500 Iron deficiency anemia secondary to blood loss (chronic): Secondary | ICD-10-CM

## 2021-04-28 ENCOUNTER — Inpatient Hospital Stay (HOSPITAL_BASED_OUTPATIENT_CLINIC_OR_DEPARTMENT_OTHER): Payer: Medicaid Other | Admitting: Physician Assistant

## 2021-04-28 ENCOUNTER — Other Ambulatory Visit: Payer: Self-pay

## 2021-04-28 ENCOUNTER — Inpatient Hospital Stay: Payer: Medicaid Other | Attending: Physician Assistant

## 2021-04-28 VITALS — BP 103/82 | HR 87 | Temp 98.8°F | Resp 20 | Wt 139.7 lb

## 2021-04-28 DIAGNOSIS — Z793 Long term (current) use of hormonal contraceptives: Secondary | ICD-10-CM | POA: Diagnosis not present

## 2021-04-28 DIAGNOSIS — N920 Excessive and frequent menstruation with regular cycle: Secondary | ICD-10-CM | POA: Diagnosis present

## 2021-04-28 DIAGNOSIS — D509 Iron deficiency anemia, unspecified: Secondary | ICD-10-CM | POA: Diagnosis not present

## 2021-04-28 DIAGNOSIS — D696 Thrombocytopenia, unspecified: Secondary | ICD-10-CM | POA: Insufficient documentation

## 2021-04-28 DIAGNOSIS — D500 Iron deficiency anemia secondary to blood loss (chronic): Secondary | ICD-10-CM | POA: Diagnosis present

## 2021-04-28 DIAGNOSIS — Z79899 Other long term (current) drug therapy: Secondary | ICD-10-CM | POA: Insufficient documentation

## 2021-04-28 LAB — CBC WITH DIFFERENTIAL (CANCER CENTER ONLY)
Abs Immature Granulocytes: 0.01 10*3/uL (ref 0.00–0.07)
Basophils Absolute: 0 10*3/uL (ref 0.0–0.1)
Basophils Relative: 0 %
Eosinophils Absolute: 0.1 10*3/uL (ref 0.0–0.5)
Eosinophils Relative: 1 %
HCT: 39.8 % (ref 36.0–46.0)
Hemoglobin: 13 g/dL (ref 12.0–15.0)
Immature Granulocytes: 0 %
Lymphocytes Relative: 47 %
Lymphs Abs: 1.9 10*3/uL (ref 0.7–4.0)
MCH: 30 pg (ref 26.0–34.0)
MCHC: 32.7 g/dL (ref 30.0–36.0)
MCV: 91.9 fL (ref 80.0–100.0)
Monocytes Absolute: 0.2 10*3/uL (ref 0.1–1.0)
Monocytes Relative: 6 %
Neutro Abs: 1.9 10*3/uL (ref 1.7–7.7)
Neutrophils Relative %: 46 %
Platelet Count: 78 10*3/uL — ABNORMAL LOW (ref 150–400)
RBC: 4.33 MIL/uL (ref 3.87–5.11)
RDW: 13.4 % (ref 11.5–15.5)
WBC Count: 4 10*3/uL (ref 4.0–10.5)
nRBC: 0 % (ref 0.0–0.2)

## 2021-04-28 LAB — IRON AND IRON BINDING CAPACITY (CC-WL,HP ONLY)
Iron: 197 ug/dL — ABNORMAL HIGH (ref 28–170)
Saturation Ratios: 69 % — ABNORMAL HIGH (ref 10.4–31.8)
TIBC: 284 ug/dL (ref 250–450)
UIBC: 87 ug/dL — ABNORMAL LOW (ref 148–442)

## 2021-04-28 LAB — CMP (CANCER CENTER ONLY)
ALT: 11 U/L (ref 0–44)
AST: 13 U/L — ABNORMAL LOW (ref 15–41)
Albumin: 4.1 g/dL (ref 3.5–5.0)
Alkaline Phosphatase: 52 U/L (ref 38–126)
Anion gap: 4 — ABNORMAL LOW (ref 5–15)
BUN: 10 mg/dL (ref 6–20)
CO2: 27 mmol/L (ref 22–32)
Calcium: 9.1 mg/dL (ref 8.9–10.3)
Chloride: 106 mmol/L (ref 98–111)
Creatinine: 0.82 mg/dL (ref 0.44–1.00)
GFR, Estimated: >60 mL/min (ref >60)
Glucose, Bld: 86 mg/dL (ref 70–99)
Potassium: 4 mmol/L (ref 3.5–5.1)
Sodium: 137 mmol/L (ref 135–145)
Total Bilirubin: 0.6 mg/dL (ref 0.3–1.2)
Total Protein: 7 g/dL (ref 6.5–8.1)

## 2021-04-28 LAB — FERRITIN: Ferritin: 18 ng/mL (ref 11–307)

## 2021-04-28 NOTE — Progress Notes (Signed)
? [facility] ?[phone]   [phone] ? ?PROGRESS NOTE ? ?Patient Care Team: ?Nelwyn Salisbury, MD as PCP - General ?Jaci Standard, MD as Consulting Physician (Hematology and Oncology) ? ? ?CHIEF COMPLAINTS/PURPOSE OF CONSULTATION:  ?Iron deficiency anemia and thrombocytopenia ? ?Hematological History:  ?#Iron deficiency anemia: ?-Patient was seen by [facility] Hematology team from 2104-2020 with Dr. Gaylyn Rong and then Dr. Bertis Ruddy.  ?-07/27/2012: Received IV feraheme 1020 mg dose x 1. Developed hives several days later.  ?-Currently on oral iron 325 mg once daily ?-01/30/2021: Received IV monoferric 1000 mg x 1.  ? ?#Thrombocytopenia, likely ITP: ?---Received steroid therapy during her pregnancy in 2020.  ? ? ?HISTORY OF PRESENTING ILLNESS:  ?Melinda [name] 36 y.o. female returns for follow-up for iron deficiency anemia and thrombocytopenia, likely ITP.  Patient was last seen in clinic on 01/20/2021.  In the interim, she received IV Monoferric x1 dose. ? ?At today's visit, Melinda [name] reports that 3 to 4 days after receiving IV iron, she developed a mild allergic reaction with a diffuse pruritic rash that resolved on its own.  Her energy levels have markedly improved and she continues to complete all her daily activities on her own.  She denies any changes to her appetite or weight.  She denies nausea, vomiting or abdominal pain.  She reports some constipation while taking iron pills.  She denies easy bruising or signs of active bleeding except for her menstrual cycle.  She continues to be on birth control which has improved her menstrual bleeding.  She denies fevers, chills, night sweats, shortness of breath, chest pain or cough.  She has no other complaints.  Rest of the 10 point ROS is below. ? ?MEDICAL HISTORY:  ?Past Medical History:  ?Diagnosis Date  ? Anemia   ? Chickenpox   ? Early satiety   ? Herpes genitalia   ? HPV (human papilloma virus) anogenital infection   ? Iron deficiency  anemia   ? Night sweat   ? Scarring, keloid   ? external genitalia  ? Thrombocytopenia (HCC)   ? Vaginal Pap smear, abnormal   ? Weight loss, non-intentional   ? ? ?SURGICAL HISTORY: ?Past Surgical History:  ?Procedure Laterality Date  ? CESAREAN SECTION N/A 09/20/2018  ? Procedure: CESAREAN SECTION;  Surgeon: Kathrynn Running, MD;  Location: [facility] LD ORS;  Service: Obstetrics;  Laterality: N/A;  ? WISDOM TOOTH EXTRACTION    ? ? ?SOCIAL HISTORY: ?Social History  ? ?Socioeconomic History  ? Marital status: Single  ?  Spouse name: Not on file  ? Number of children: 0  ? Years of education: Not on file  ? Highest education level: Master's degree (e.g., MA, MS, MEng, MEd, MSW, MBA)  ?Occupational History  ?  Employer: [employer]  ?  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 Smoking status: Never  ? Smokeless tobacco: Never  ?Vaping Use  ? Vaping Use: Never used  ?Substance and Sexual Activity  ? Alcohol use: No  ?  Alcohol/week: 0.0 standard drinks  ? Drug use: No  ? Sexual activity: Not Currently  ?  Birth control/protection: None  ?Other Topics Concern  ? Not on file  ?Social History Narrative  ? Not on file  ? ?Social Determinants of Health  ? ?Financial Resource Strain: Not on file  ?Food Insecurity: Not on file  ?Transportation Needs: Not on file  ?Physical Activity: Not on file  ?Stress: Not on file  ?  Social Connections: Not on file  ?Intimate Partner Violence: Not on file  ? ? ?FAMILY HISTORY: ?Family History  ?Problem Relation Age of Onset  ? Hypertension Maternal Grandmother   ? Diabetes Maternal Grandmother   ? Hypertension Mother   ? Other Father   ?     Leukopenia.  ? Hypertension Maternal Uncle   ? ? ?ALLERGIES:  has No Known Allergies. ? ?MEDICATIONS:  ?Current Outpatient Medications  ?Medication Sig Dispense Refill  ? acetaminophen (TYLENOL) 325 MG tablet Take 2 tablets (650 mg total) by mouth every 4 (four) hours as needed for up to 30 doses for mild pain. 30 tablet 1  ?  amoxicillin (AMOXIL) 500 MG capsule Take 1 capsule (500 mg total) by mouth 3 (three) times daily. 21 capsule 0  ? Norgestimate-Ethinyl Estradiol Triphasic 0.18/0.215/0.25 MG-25 MCG tab Take 1 tablet by mouth daily. 1 Package 11  ? oxyCODONE-acetaminophen (PERCOCET) 5-325 MG tablet Take 1-2 tablets by mouth every 6 (six) hours as needed for up to 8 doses for severe pain. 8 tablet 0  ? Prenat-Fe Carbonyl-FA-Omega 3 (ONE-A-DAY WOMENS PRENATAL 1) 28-0.8-235 MG CAPS Take 1 capsule by mouth daily. 30 capsule 0  ? valACYclovir (VALTREX) 500 MG tablet Take 1 tablet (500 mg total) by mouth 2 (two) times daily. 60 tablet 1  ? ferrous sulfate 325 (65 FE) MG tablet Take 1 tablet (325 mg total) by mouth daily. 30 tablet 3  ? ?No current facility-administered medications for this visit.  ? ? ?REVIEW OF SYSTEMS:   ?Constitutional: ( - ) fevers, ( - )  chills , ( - ) night sweats ?Eyes: ( - ) blurriness of vision, ( - ) double vision, ( - ) watery eyes ?Ears, nose, mouth, throat, and face: ( - ) mucositis, ( - ) sore throat ?Respiratory: ( - ) cough, (- ) dyspnea, ( - ) wheezes ?Cardiovascular: ( - ) palpitation, ( - ) chest discomfort, ( - ) lower extremity swelling ?Gastrointestinal:  ( - ) nausea, ( - ) heartburn, ( - ) change in bowel habits ?Skin: ( - ) abnormal skin rashes ?Lymphatics: ( - ) new lymphadenopathy, ( - ) easy bruising ?Neurological: ( - ) numbness, ( - ) tingling, ( - ) new weaknesses ?Behavioral/Psych: ( - ) mood change, ( - ) new changes  ?All other systems were reviewed with the patient and are negative. ? ?PHYSICAL EXAMINATION: ?ECOG PERFORMANCE STATUS: 0 - Asymptomatic ? ?Vitals:  ? 04/28/21 1017  ?BP: 103/82  ?Pulse: 87  ?Resp: 20  ?Temp: 98.8 ?F (37.1 ?C)  ?SpO2: 100%  ? ?Filed Weights  ? 04/28/21 1017  ?Weight: 139 lb 11.2 oz (63.4 kg)  ? ? ?GENERAL: well appearing female in NAD  ?SKIN: skin color, texture, turgor are normal, no rashes or significant lesions ?EYES: conjunctiva are pink and non-injected,  sclera clear ?LUNGS: clear to auscultation and percussion with normal breathing effort ?HEART: regular rate & rhythm and no murmurs and no lower extremity edema ?Musculoskeletal: no cyanosis of digits and no clubbing  ?PSYCH: alert & oriented x 3, fluent speech ?NEURO: no focal motor/sensory deficits ? ?LABORATORY DATA:  ?I have reviewed the data as listed ? ?  Latest Ref Rng & Units 04/28/2021  ? 10:03 AM 01/20/2021  ? 12:23 PM 10/06/2018  ?  9:52 AM  ?CBC  ?WBC 4.0 - 10.5 K/uL 4.0   4.0   2.5    ?Hemoglobin 12.0 - 15.0 g/dL 96.0   9.8  11.5    ?Hematocrit 36.0 - 46.0 % 39.8   32.7   35.1    ?Platelets 150 - 400 K/uL 78   118   120    ? ? ? ?  Latest Ref Rng & Units 04/28/2021  ? 10:03 AM 01/20/2021  ? 12:23 PM 09/19/2018  ? 10:30 AM  ?CMP  ?Glucose 70 - 99 mg/dL 86   81   161103    ?BUN 6 - 20 mg/dL 10   12   8     ?Creatinine 0.44 - 1.00 mg/dL 0.960.82   0.450.78   4.090.75    ?Sodium 135 - 145 mmol/L 137   137   138    ?Potassium 3.5 - 5.1 mmol/L 4.0   3.9   3.6    ?Chloride 98 - 111 mmol/L 106   107   108    ?CO2 22 - 32 mmol/L 27   24   21     ?Calcium 8.9 - 10.3 mg/dL 9.1   9.1   8.8    ?Total Protein 6.5 - 8.1 g/dL 7.0   7.2   6.2    ?Total Bilirubin 0.3 - 1.2 mg/dL 0.6   0.4   0.4    ?Alkaline Phos 38 - 126 U/L 52   51   138    ?AST 15 - 41 U/L 13   14   17     ?ALT 0 - 44 U/L 11   17   28     ? ?ASSESSMENT & PLAN ?Melinda [name] is a 36 y.o. 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  ? ?#Iron deficiency anemia 2/2 heavy menstrual bleeding: ?--Received IV monoferric 1000 mg x 1 dose on 01/30/2021 ?--Continue to incorporate iron rich foods into diet. ?--Currently on birth control and under the care of gynecologist ?--Labs today to show anemia has resolved with Hgb 13.0. Iron panel show no evidence of deficiency.  ?--Currently on ferrous sulfate 325 mg once daily. Recommend to continue with a source of vitamin C. ?--Need to premedicate for future IV iron infusions as patient had mild  rash/pruritis with most recent infusion.  ? ?#Thrombocytopenia, likely ITP: ?--Patient received steroids during her pregnancy in 2020 with improvement of platelet counts. Otherwise, she has not received additio

## 2021-07-28 ENCOUNTER — Other Ambulatory Visit: Payer: Medicaid Other

## 2021-08-06 ENCOUNTER — Other Ambulatory Visit: Payer: Self-pay

## 2021-08-06 ENCOUNTER — Inpatient Hospital Stay: Payer: Medicaid Other | Attending: Hematology and Oncology

## 2021-08-06 DIAGNOSIS — N920 Excessive and frequent menstruation with regular cycle: Secondary | ICD-10-CM | POA: Insufficient documentation

## 2021-08-06 DIAGNOSIS — D696 Thrombocytopenia, unspecified: Secondary | ICD-10-CM | POA: Diagnosis not present

## 2021-08-06 DIAGNOSIS — D500 Iron deficiency anemia secondary to blood loss (chronic): Secondary | ICD-10-CM | POA: Insufficient documentation

## 2021-08-06 LAB — CBC WITH DIFFERENTIAL (CANCER CENTER ONLY)
Abs Immature Granulocytes: 0.02 10*3/uL (ref 0.00–0.07)
Basophils Absolute: 0 10*3/uL (ref 0.0–0.1)
Basophils Relative: 0 %
Eosinophils Absolute: 0.1 10*3/uL (ref 0.0–0.5)
Eosinophils Relative: 2 %
HCT: 37.1 % (ref 36.0–46.0)
Hemoglobin: 12.6 g/dL (ref 12.0–15.0)
Immature Granulocytes: 0 %
Lymphocytes Relative: 26 %
Lymphs Abs: 1.7 10*3/uL (ref 0.7–4.0)
MCH: 31.3 pg (ref 26.0–34.0)
MCHC: 34 g/dL (ref 30.0–36.0)
MCV: 92.1 fL (ref 80.0–100.0)
Monocytes Absolute: 0.3 10*3/uL (ref 0.1–1.0)
Monocytes Relative: 4 %
Neutro Abs: 4.4 10*3/uL (ref 1.7–7.7)
Neutrophils Relative %: 68 %
Platelet Count: 83 10*3/uL — ABNORMAL LOW (ref 150–400)
RBC: 4.03 MIL/uL (ref 3.87–5.11)
RDW: 12.3 % (ref 11.5–15.5)
WBC Count: 6.5 10*3/uL (ref 4.0–10.5)
nRBC: 0 % (ref 0.0–0.2)

## 2021-08-07 ENCOUNTER — Telehealth: Payer: Self-pay | Admitting: Family Medicine

## 2021-08-07 NOTE — Telephone Encounter (Signed)
Pt called requesting to speak to CMA Harriett Sine) CMA was unavailable. Please give Pt a call back, when you can.   [phone]

## 2021-08-10 NOTE — Telephone Encounter (Signed)
Left  a message for pt to call the office back regarding this message

## 2021-08-18 NOTE — Telephone Encounter (Signed)
Reviewed lab results with pt , verbalized understanding, pt state that her platelets have always been low, thinks its hereditary since her mother has same issue, she wants to know if there is anything she can do to get them to normal range. Please advise

## 2021-08-18 NOTE — Telephone Encounter (Signed)
The best way to treat this is by taking steroids like prednisone, but I don't think that is needed right now. She can follow up with Hematology as well (she saw them in April)

## 2021-08-19 NOTE — Telephone Encounter (Signed)
Spoke with patient, message given.    

## 2021-10-26 ENCOUNTER — Other Ambulatory Visit: Payer: Self-pay | Admitting: Obstetrics and Gynecology

## 2021-10-26 DIAGNOSIS — R928 Other abnormal and inconclusive findings on diagnostic imaging of breast: Secondary | ICD-10-CM

## 2021-10-27 ENCOUNTER — Other Ambulatory Visit: Payer: Self-pay | Admitting: Hematology and Oncology

## 2021-10-27 ENCOUNTER — Inpatient Hospital Stay: Payer: Medicaid Other | Attending: Hematology and Oncology | Admitting: Hematology and Oncology

## 2021-10-27 ENCOUNTER — Telehealth: Payer: Self-pay | Admitting: Hematology and Oncology

## 2021-10-27 ENCOUNTER — Inpatient Hospital Stay: Payer: Medicaid Other

## 2021-10-27 VITALS — BP 117/77 | HR 84 | Temp 98.2°F | Resp 16 | Wt 140.4 lb

## 2021-10-27 DIAGNOSIS — D696 Thrombocytopenia, unspecified: Secondary | ICD-10-CM

## 2021-10-27 DIAGNOSIS — D509 Iron deficiency anemia, unspecified: Secondary | ICD-10-CM | POA: Diagnosis present

## 2021-10-27 DIAGNOSIS — D500 Iron deficiency anemia secondary to blood loss (chronic): Secondary | ICD-10-CM | POA: Diagnosis not present

## 2021-10-27 LAB — CMP (CANCER CENTER ONLY)
ALT: 24 U/L (ref 0–44)
AST: 18 U/L (ref 15–41)
Albumin: 3.8 g/dL (ref 3.5–5.0)
Alkaline Phosphatase: 44 U/L (ref 38–126)
Anion gap: 4 — ABNORMAL LOW (ref 5–15)
BUN: 8 mg/dL (ref 6–20)
CO2: 26 mmol/L (ref 22–32)
Calcium: 8.4 mg/dL — ABNORMAL LOW (ref 8.9–10.3)
Chloride: 107 mmol/L (ref 98–111)
Creatinine: 0.88 mg/dL (ref 0.44–1.00)
GFR, Estimated: >60 mL/min (ref >60)
Glucose, Bld: 102 mg/dL — ABNORMAL HIGH (ref 70–99)
Potassium: 3.8 mmol/L (ref 3.5–5.1)
Sodium: 137 mmol/L (ref 135–145)
Total Bilirubin: 0.7 mg/dL (ref 0.3–1.2)
Total Protein: 6.8 g/dL (ref 6.5–8.1)

## 2021-10-27 LAB — CBC WITH DIFFERENTIAL (CANCER CENTER ONLY)
Abs Immature Granulocytes: 0.01 10*3/uL (ref 0.00–0.07)
Basophils Absolute: 0 10*3/uL (ref 0.0–0.1)
Basophils Relative: 0 %
Eosinophils Absolute: 0.1 10*3/uL (ref 0.0–0.5)
Eosinophils Relative: 2 %
HCT: 38.9 % (ref 36.0–46.0)
Hemoglobin: 13.2 g/dL (ref 12.0–15.0)
Immature Granulocytes: 0 %
Lymphocytes Relative: 36 %
Lymphs Abs: 1.7 10*3/uL (ref 0.7–4.0)
MCH: 31.5 pg (ref 26.0–34.0)
MCHC: 33.9 g/dL (ref 30.0–36.0)
MCV: 92.8 fL (ref 80.0–100.0)
Monocytes Absolute: 0.2 10*3/uL (ref 0.1–1.0)
Monocytes Relative: 5 %
Neutro Abs: 2.6 10*3/uL (ref 1.7–7.7)
Neutrophils Relative %: 57 %
Platelet Count: 65 10*3/uL — ABNORMAL LOW (ref 150–400)
RBC: 4.19 MIL/uL (ref 3.87–5.11)
RDW: 12.7 % (ref 11.5–15.5)
WBC Count: 4.6 10*3/uL (ref 4.0–10.5)
nRBC: 0 % (ref 0.0–0.2)

## 2021-10-27 LAB — RETIC PANEL
Immature Retic Fract: 9.7 % (ref 2.3–15.9)
RBC.: 4.16 MIL/uL (ref 3.87–5.11)
Retic Count, Absolute: 59.9 10*3/uL (ref 19.0–186.0)
Retic Ct Pct: 1.4 % (ref 0.4–3.1)
Reticulocyte Hemoglobin: 35 pg (ref >27.9)

## 2021-10-27 LAB — FERRITIN: Ferritin: <1 ng/mL — ABNORMAL LOW (ref 11–307)

## 2021-10-27 LAB — IRON AND IRON BINDING CAPACITY (CC-WL,HP ONLY)
Iron: 162 ug/dL (ref 28–170)
Saturation Ratios: 42 % — ABNORMAL HIGH (ref 10.4–31.8)
TIBC: 385 ug/dL (ref 250–450)
UIBC: 223 ug/dL (ref 148–442)

## 2021-10-27 LAB — IMMATURE PLATELET FRACTION: Immature Platelet Fraction: 27.9 % — ABNORMAL HIGH (ref 1.2–8.6)

## 2021-10-27 NOTE — Progress Notes (Signed)
[place] [phone]   [phone]  PROGRESS NOTE  Patient Care Team: Melinda Morale, MD as PCP - General Melinda [name] Melinda Lamb, MD as Consulting Physician (Hematology and Oncology)  Hematological History:  #Iron deficiency anemia: -Patient was seen by [facility] Hematology team from 2014-2020 with Melinda [name] and then Melinda [name].  -07/27/2012: Received IV feraheme 1020 mg dose x 1. Developed hives several days later.  -Currently on oral iron 325 mg once daily -01/30/2021: Received IV monoferric 1000 mg x 1.   #Thrombocytopenia, likely ITP: ---Received steroid therapy during her pregnancy in 2020.    HISTORY OF PRESENTING ILLNESS:  Melinda [name] 36 y.o. female returns for follow-up for iron deficiency anemia and thrombocytopenia, likely ITP.  Patient was last seen in clinic on 04/28/2021.  In the interim she has continued on her p.o. iron therapy.  On exam today Melinda [name] reports she has been well overall in the interim since her last visit.  She reports that she is "feeling great".  She did have a mammogram last week with suspicious spot and she is undergoing repeat imaging.  She notes her energy is currently an 8 out of 10.  She reports that she has not required any iron infusions since January.  She has been taking her iron pills consistently and drinking orange juice with it.  It is not causing any stomach upset or constipation.  She reports her menstrual cycles are much better after she started a contraceptive patch.  She denies any issues with lightheadedness, dizziness, or shortness of breath.  She is not having any further ice cravings.  She denies fevers, chills, night sweats, shortness of breath, chest pain or cough.  She has no other complaints.  Rest of the 10 point ROS is below.  MEDICAL HISTORY:  Past Medical History:  Diagnosis Date   Anemia    Chickenpox    Early satiety    Herpes genitalia    HPV (human papilloma virus) anogenital infection     Iron deficiency anemia    Night sweat    Scarring, keloid    external genitalia   Thrombocytopenia (HCC)    Vaginal Pap smear, abnormal    Weight loss, non-intentional     SURGICAL HISTORY: Past Surgical History:  Procedure Laterality Date   CESAREAN SECTION N/A 09/20/2018   Procedure: CESAREAN SECTION;  Surgeon: Melinda Edinger, MD;  Location: [facility] LD ORS;  Service: Obstetrics;  Laterality: N/A;   WISDOM TOOTH EXTRACTION      SOCIAL HISTORY: Social History   Socioeconomic History   Marital status: Single    Spouse name: Not on file   Number of children: 0   Years of education: Not on file   Highest education level: Master's degree (e.g., MA, MS, MEng, MEd, MSW, MBA)  Occupational History    Employer: [employer]    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[employer]; Pharmacist, hospital  Tobacco Use   Smoking status: Never   Smokeless tobacco: Never  Vaping Use   Vaping Use: Never used  Substance and Sexual Activity   Alcohol use: No    Alcohol/week: 0.0 standard drinks of alcohol   Drug use: No   Sexual activity: Not Currently    Birth control/protection: None  Other Topics Concern   Not on file  Social History Narrative   Not on file   Social Determinants of Health   Financial Resource Strain: Medium Risk (06/15/2018)   Overall Financial Resource Strain (CARDIA)  Difficulty of Paying Living Expenses: Somewhat hard  Food Insecurity: Food Insecurity Present (06/15/2018)   Hunger Vital Sign    Worried About Running Out of Food in the Last Year: Sometimes true    Ran Out of Food in the Last Year: Sometimes true  Transportation Needs: No Transportation Needs (06/15/2018)   PRAPARE - Administrator, Civil Service (Medical): No    Lack of Transportation (Non-Medical): No  Physical Activity: Inactive (06/15/2018)   Exercise Vital Sign    Days of Exercise per Week: 0 days    Minutes of Exercise per Session: 0 min  Stress: Stress Concern Present (06/15/2018)   [employer] of Occupational Health - Occupational Stress Questionnaire    Feeling of Stress : To some extent  Social Connections: Moderately Isolated (06/15/2018)   Social Connection and Isolation Panel [NHANES]    Frequency of Communication with Friends and Family: Once a week    Frequency of Social Gatherings with Friends and Family: Once a week    Attends Religious Services: More than 4 times per year    Active Member of [employer] or Organizations: No    Attends Banker Meetings: Never    Marital Status: Never married  Intimate Partner Violence: Not At Risk (06/15/2018)   Humiliation, Afraid, Rape, and Kick questionnaire    Fear of Current or Ex-Partner: No    Emotionally Abused: No    Physically Abused: No    Sexually Abused: No    FAMILY HISTORY: Family History  Problem Relation Age of Onset   Hypertension Maternal Grandmother    Diabetes Maternal Grandmother    Hypertension Mother    Other Father        Leukopenia.   Hypertension Maternal Uncle     ALLERGIES:  has No Known Allergies.  MEDICATIONS:  Current Outpatient Medications  Medication Sig Dispense Refill   Norgestimate-Ethinyl Estradiol Triphasic 0.18/0.215/0.25 MG-25 MCG tab Take 1 tablet by mouth daily. 1 Package 11   Prenat-Fe Carbonyl-FA-Omega 3 (ONE-A-DAY WOMENS PRENATAL 1) 28-0.8-235 MG CAPS Take 1 capsule by mouth daily. 30 capsule 0   valACYclovir (VALTREX) 500 MG tablet Take 1 tablet (500 mg total) by mouth 2 (two) times daily. 60 tablet 1   acetaminophen (TYLENOL) 325 MG tablet Take 2 tablets (650 mg total) by mouth every 4 (four) hours as needed for up to 30 doses for mild pain. (Patient not taking: Reported on 10/27/2021) 30 tablet 1   ferrous sulfate 325 (65 FE) MG tablet Take 1 tablet (325 mg total) by mouth daily. 30 tablet 3   No current facility-administered medications for this visit.    REVIEW OF SYSTEMS:   Constitutional: ( - ) fevers, ( - )  chills , ( - ) night sweats Eyes: ( - )  blurriness of vision, ( - ) double vision, ( - ) watery eyes Ears, nose, mouth, throat, and face: ( - ) mucositis, ( - ) sore throat Respiratory: ( - ) cough, (- ) dyspnea, ( - ) wheezes Cardiovascular: ( - ) palpitation, ( - ) chest discomfort, ( - ) lower extremity swelling Gastrointestinal:  ( - ) nausea, ( - ) heartburn, ( - ) change in bowel habits Skin: ( - ) abnormal skin rashes Lymphatics: ( - ) new lymphadenopathy, ( - ) easy bruising Neurological: ( - ) numbness, ( - ) tingling, ( - ) new weaknesses Behavioral/Psych: ( - ) mood change, ( - ) new changes  All other  systems were reviewed with the patient and are negative.  PHYSICAL EXAMINATION: ECOG PERFORMANCE STATUS: 0 - Asymptomatic  Vitals:   10/27/21 1045  BP: 117/77  Pulse: 84  Resp: 16  Temp: 98.2 F (36.8 C)  SpO2: 100%   Filed Weights   10/27/21 1045  Weight: 140 lb 6.4 oz (63.7 kg)    GENERAL: well appearing female in NAD  SKIN: skin color, texture, turgor are normal, no rashes or significant lesions EYES: conjunctiva are pink and non-injected, sclera clear LUNGS: clear to auscultation and percussion with normal breathing effort HEART: regular rate & rhythm and no murmurs and no lower extremity edema Musculoskeletal: no cyanosis of digits and no clubbing  PSYCH: alert & oriented x 3, fluent speech NEURO: no focal motor/sensory deficits  LABORATORY DATA:  I have reviewed the data as listed    Latest Ref Rng & Units 08/06/2021    2:10 PM 04/28/2021   10:03 AM 01/20/2021   12:23 PM  CBC  WBC 4.0 - 10.5 K/uL 6.5  4.0  4.0   Hemoglobin 12.0 - 15.0 g/dL 24.5  80.9  9.8   Hematocrit 36.0 - 46.0 % 37.1  39.8  32.7   Platelets 150 - 400 K/uL 83  78  118        Latest Ref Rng & Units 04/28/2021   10:03 AM 01/20/2021   12:23 PM 09/19/2018   10:30 AM  CMP  Glucose 70 - 99 mg/dL 86  81  983   BUN 6 - 20 mg/dL 10  12  8    Creatinine 0.44 - 1.00 mg/dL  3.82  5.05   Sodium 135 - 145 mmol/L 137  137  138    Potassium 3.5 - 5.1 mmol/L 4.0  3.9  3.6   Chloride 98 - 111 mmol/L 106  107  108   CO2 22 - 32 mmol/L 27  24  21    Calcium 8.9 - 10.3 mg/dL 9.1  9.1  8.8   Total Protein 6.5 - 8.1 g/dL 7.0  7.2  6.2   Total Bilirubin 0.3 - 1.2 mg/dL 0.6  0.4  0.4   Alkaline Phos 38 - 126 U/L 52  51  138   AST 15 - 41 U/L 13  14  17    ALT 0 - 44 U/L 11  17  28     ASSESSMENT & PLAN Jenniffer Vessels is a 36 y.o. 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   #Iron deficiency anemia 2/2 heavy menstrual bleeding: --Received IV monoferric 1000 mg x 1 dose on 01/30/2021 --Continue to incorporate iron rich foods into diet. --Currently on birth control and under the care of gynecologist --Labs today to show anemia has resolved with Hgb 13.0. Iron panel show no evidence of deficiency.  --Currently on ferrous sulfate 325 mg once daily. Recommend to continue with a source of vitamin C. --Need to premedicate for future IV iron infusions as patient had mild rash/pruritis with most recent infusion.   #Thrombocytopenia, likely ITP: --Patient received steroids during her pregnancy in 2020 with improvement of platelet counts. Otherwise, she has not received additional therapy --Patient denies signs of bleeding except for menstrual cycle.  --Workup from 01/20/2021 showed no evidence of additional deficiencies, hepatitis B or C.  Immature platelet fraction was elevated to suggests ITP as underlying etiology. --labs today show white blood cell 4.6, hemoglobin 30.2, MCV 92.8, and platelets of 65 --Continue to monitor  with strict precautions for bleeding.  Follow up: -- Labs every 3 months with clinic visits every 6.  No orders of the defined types were placed in this encounter.   All questions were answered. The patient knows to call the clinic with any problems, questions or concerns.  I have spent a total of 30 minutes minutes of face-to-face and non-face-to-face time,  preparing to see the patient, performing a medically appropriate examination, counseling and educating the patient, ordering tests, documenting clinical information in the electronic health record, and care coordination.   Ulysees Barns, MD Department of Hematology/Oncology [facility] Phone: [phone] Pager: [phone] Email: [email or web address]

## 2021-10-27 NOTE — Telephone Encounter (Signed)
Per 10/17 los called and spoke to pt about appointment  

## 2021-11-04 ENCOUNTER — Ambulatory Visit
Admission: RE | Admit: 2021-11-04 | Discharge: 2021-11-04 | Disposition: A | Discharge status: Home / Self Care | Payer: Medicaid Other | Source: Ambulatory Visit | Attending: Obstetrics and Gynecology | Admitting: Obstetrics and Gynecology

## 2021-11-04 ENCOUNTER — Ambulatory Visit: Payer: Medicaid Other

## 2021-11-04 DIAGNOSIS — R928 Other abnormal and inconclusive findings on diagnostic imaging of breast: Secondary | ICD-10-CM

## 2022-01-27 ENCOUNTER — Other Ambulatory Visit: Payer: Self-pay | Admitting: Hematology and Oncology

## 2022-01-27 ENCOUNTER — Inpatient Hospital Stay: Payer: BC Managed Care – PPO | Attending: Hematology and Oncology

## 2022-01-27 DIAGNOSIS — N920 Excessive and frequent menstruation with regular cycle: Secondary | ICD-10-CM | POA: Diagnosis present

## 2022-01-27 DIAGNOSIS — D500 Iron deficiency anemia secondary to blood loss (chronic): Secondary | ICD-10-CM | POA: Insufficient documentation

## 2022-01-27 DIAGNOSIS — D696 Thrombocytopenia, unspecified: Secondary | ICD-10-CM | POA: Insufficient documentation

## 2022-01-27 LAB — CBC WITH DIFFERENTIAL (CANCER CENTER ONLY)
Abs Immature Granulocytes: 0 10*3/uL (ref 0.00–0.07)
Basophils Absolute: 0 10*3/uL (ref 0.0–0.1)
Basophils Relative: 0 %
Eosinophils Absolute: 0.1 10*3/uL (ref 0.0–0.5)
Eosinophils Relative: 2 %
HCT: 37.8 % (ref 36.0–46.0)
Hemoglobin: 12.7 g/dL (ref 12.0–15.0)
Immature Granulocytes: 0 %
Lymphocytes Relative: 44 %
Lymphs Abs: 1.4 10*3/uL (ref 0.7–4.0)
MCH: 30.3 pg (ref 26.0–34.0)
MCHC: 33.6 g/dL (ref 30.0–36.0)
MCV: 90.2 fL (ref 80.0–100.0)
Monocytes Absolute: 0.2 10*3/uL (ref 0.1–1.0)
Monocytes Relative: 8 %
Neutro Abs: 1.5 10*3/uL — ABNORMAL LOW (ref 1.7–7.7)
Neutrophils Relative %: 46 %
Platelet Count: 64 10*3/uL — ABNORMAL LOW (ref 150–400)
RBC: 4.19 MIL/uL (ref 3.87–5.11)
RDW: 13.1 % (ref 11.5–15.5)
WBC Count: 3.2 10*3/uL — ABNORMAL LOW (ref 4.0–10.5)
nRBC: 0 % (ref 0.0–0.2)

## 2022-01-27 LAB — CMP (CANCER CENTER ONLY)
ALT: 12 U/L (ref 0–44)
AST: 13 U/L — ABNORMAL LOW (ref 15–41)
Albumin: 3.5 g/dL (ref 3.5–5.0)
Alkaline Phosphatase: 43 U/L (ref 38–126)
Anion gap: 5 (ref 5–15)
BUN: 8 mg/dL (ref 6–20)
CO2: 25 mmol/L (ref 22–32)
Calcium: 8.5 mg/dL — ABNORMAL LOW (ref 8.9–10.3)
Chloride: 109 mmol/L (ref 98–111)
Creatinine: 0.86 mg/dL (ref 0.44–1.00)
GFR, Estimated: >60 mL/min (ref >60)
Glucose, Bld: 63 mg/dL — ABNORMAL LOW (ref 70–99)
Potassium: 4 mmol/L (ref 3.5–5.1)
Sodium: 139 mmol/L (ref 135–145)
Total Bilirubin: 0.5 mg/dL (ref 0.3–1.2)
Total Protein: 6.5 g/dL (ref 6.5–8.1)

## 2022-01-27 LAB — RETIC PANEL
Immature Retic Fract: 9.2 % (ref 2.3–15.9)
RBC.: 4.17 MIL/uL (ref 3.87–5.11)
Retic Count, Absolute: 58.4 10*3/uL (ref 19.0–186.0)
Retic Ct Pct: 1.4 % (ref 0.4–3.1)
Reticulocyte Hemoglobin: 33.7 pg (ref >27.9)

## 2022-01-27 LAB — IRON AND IRON BINDING CAPACITY (CC-WL,HP ONLY)
Iron: 109 ug/dL (ref 28–170)
Saturation Ratios: 28 % (ref 10.4–31.8)
TIBC: 391 ug/dL (ref 250–450)
UIBC: 282 ug/dL (ref 148–442)

## 2022-01-27 LAB — FERRITIN: Ferritin: 4 ng/mL — ABNORMAL LOW (ref 11–307)

## 2022-02-24 ENCOUNTER — Ambulatory Visit (INDEPENDENT_AMBULATORY_CARE_PROVIDER_SITE_OTHER): Payer: BC Managed Care – PPO | Admitting: Family Medicine

## 2022-02-24 ENCOUNTER — Encounter: Payer: Self-pay | Admitting: Family Medicine

## 2022-02-24 ENCOUNTER — Ambulatory Visit: Payer: BC Managed Care – PPO | Admitting: Family Medicine

## 2022-02-24 VITALS — BP 118/80 | HR 92 | Temp 99.4°F | Wt 143.7 lb

## 2022-02-24 DIAGNOSIS — L02422 Furuncle of left axilla: Secondary | ICD-10-CM | POA: Diagnosis not present

## 2022-02-24 NOTE — Progress Notes (Signed)
   Subjective:    Patient ID: Melinda [name], female    DOB: 1985-05-15, 37 y.o.   MRN: [id number]  HPI Here for the onset yesterday of a painful lump in the left armpit. She has never had this before. She feels fine otherwise.    Review of Systems  Constitutional: Negative.   Respiratory: Negative.    Cardiovascular: Negative.        Objective:   Physical Exam Constitutional:      Appearance: Normal appearance.  Cardiovascular:     Rate and Rhythm: Normal rate and regular rhythm.     Pulses: Normal pulses.     Heart sounds: Normal heart sounds.  Pulmonary:     Effort: Pulmonary effort is normal.     Breath sounds: Normal breath sounds.  Skin:    Comments: There is a 3 cm tender boil in the left axilla.   Neurological:     Mental Status: She is alert.           Assessment & Plan:  Boil. After informed consent was obtained, this was lanced with a scalpel and a moderate amount of purulent material was expressed. This was then dressed with gauze. We will cover with 10 days of Doxycycline. Recheck as needed. Alysia Penna, MD

## 2022-02-25 ENCOUNTER — Telehealth: Payer: Self-pay | Admitting: Family Medicine

## 2022-02-25 NOTE — Telephone Encounter (Signed)
Patient states there was an antibiotic discussed at last visit 02/24/22 and it is not at the pharmacy. Please advise

## 2022-02-25 NOTE — Telephone Encounter (Signed)
Patient calling back to check progress of this request. Advised provider is out for the day. Asking who would be able to fill.

## 2022-02-26 ENCOUNTER — Other Ambulatory Visit: Payer: Self-pay

## 2022-02-26 MED ORDER — DOXYCYCLINE HYCLATE 50 MG PO CAPS: 50.0000 mg | ORAL_CAPSULE | Freq: Two times a day (BID) | ORAL | 0 refills | Status: AC

## 2022-02-26 NOTE — Telephone Encounter (Signed)
Rx filled notified of update

## 2022-03-09 ENCOUNTER — Encounter (HOSPITAL_BASED_OUTPATIENT_CLINIC_OR_DEPARTMENT_OTHER): Payer: Self-pay

## 2022-03-09 ENCOUNTER — Other Ambulatory Visit: Payer: Self-pay

## 2022-03-09 ENCOUNTER — Emergency Department (HOSPITAL_BASED_OUTPATIENT_CLINIC_OR_DEPARTMENT_OTHER)
Admission: EM | Admit: 2022-03-09 | Discharge: 2022-03-09 | Disposition: A | Discharge status: Home / Self Care | Payer: BC Managed Care – PPO | Attending: Emergency Medicine | Admitting: Emergency Medicine

## 2022-03-09 ENCOUNTER — Emergency Department (HOSPITAL_BASED_OUTPATIENT_CLINIC_OR_DEPARTMENT_OTHER): Payer: BC Managed Care – PPO

## 2022-03-09 DIAGNOSIS — S62647A Nondisplaced fracture of proximal phalanx of left little finger, initial encounter for closed fracture: Secondary | ICD-10-CM | POA: Diagnosis not present

## 2022-03-09 DIAGNOSIS — M79651 Pain in right thigh: Secondary | ICD-10-CM | POA: Insufficient documentation

## 2022-03-09 DIAGNOSIS — Y92410 Unspecified street and highway as the place of occurrence of the external cause: Secondary | ICD-10-CM | POA: Diagnosis not present

## 2022-03-09 DIAGNOSIS — R000 Tachycardia, unspecified: Secondary | ICD-10-CM | POA: Diagnosis not present

## 2022-03-09 DIAGNOSIS — M79642 Pain in left hand: Secondary | ICD-10-CM | POA: Diagnosis present
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
## 2022-03-09 NOTE — ED Notes (Signed)
Written and verbal inst to pt  Verbalized an understanding  To home with family
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
## 2022-03-12 ENCOUNTER — Encounter: Payer: Self-pay | Admitting: Family Medicine

## 2022-03-12 ENCOUNTER — Ambulatory Visit (INDEPENDENT_AMBULATORY_CARE_PROVIDER_SITE_OTHER): Payer: BC Managed Care – PPO | Admitting: Family Medicine

## 2022-03-12 VITALS — BP 114/78 | HR 85 | Temp 98.3°F | Wt 141.0 lb

## 2022-03-12 DIAGNOSIS — S62647D Nondisplaced fracture of proximal phalanx of left little finger, subsequent encounter for fracture with routine healing: Secondary | ICD-10-CM
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
## 2022-03-23 ENCOUNTER — Encounter: Payer: Self-pay | Admitting: Family Medicine

## 2022-03-23 ENCOUNTER — Ambulatory Visit (INDEPENDENT_AMBULATORY_CARE_PROVIDER_SITE_OTHER): Payer: BC Managed Care – PPO | Admitting: Family Medicine

## 2022-03-23 VITALS — BP 118/72 | HR 88 | Temp 98.9°F | Wt 141.8 lb

## 2022-03-23 DIAGNOSIS — M545 Low back pain, unspecified: Secondary | ICD-10-CM

## 2022-03-23 LAB — POCT URINALYSIS DIPSTICK
Bilirubin, UA: NEGATIVE
Glucose, UA: NEGATIVE
Ketones, UA: NEGATIVE
Leukocytes, UA: NEGATIVE
Nitrite, UA: NEGATIVE
Protein, UA: NEGATIVE
Spec Grav, UA: 1.01 (ref 1.010–1.025)
Urobilinogen, UA: 0.2 E.U./dL
pH, UA: 7 (ref 5.0–8.0)

## 2022-03-23 NOTE — Progress Notes (Signed)
   Subjective:    Patient ID: Melinda [name], female    DOB: 02-Jul-1985, 37 y.o.   MRN: [id number]  HPI Here for intermittent symptoms that began 6 months ago, around the time that her GYN, Dr. Pricilla Loveless, changed her from a BCP to the [facility] patch. She wears the patch for 3 weeks, and then removes it to allow her to have a menstrual cycle. She every time she removes the patch she has pains in the right lower back, and chest pains. There is no fever, no SOB or coughing, no NVD. No urinary symptoms. Then when she resumes wearing the patch these symptoms resolve. Today she is on day 2 of her cycle.    Review of Systems  Constitutional: Negative.   HENT: Negative.    Eyes: Negative.   Respiratory: Negative.    Cardiovascular:  Positive for chest pain. Negative for palpitations and leg swelling.  Gastrointestinal: Negative.   Genitourinary: Negative.   Musculoskeletal:  Positive for back pain.       Objective:   Physical Exam Constitutional:      Appearance: Normal appearance. She is not ill-appearing.  Cardiovascular:     Rate and Rhythm: Normal rate and regular rhythm.     Pulses: Normal pulses.     Heart sounds: Normal heart sounds.  Pulmonary:     Effort: Pulmonary effort is normal.     Breath sounds: Normal breath sounds.  Abdominal:     General: Abdomen is flat. Bowel sounds are normal. There is no distension.     Palpations: Abdomen is soft.  Neurological:     Mental Status: She is alert.           Assessment & Plan:  There is no clear etiology for these pains, but they are clearly related to her menstrual cycles. She has an appt for a GYN exam next month. She will try to get in with them sooner if possible. She may take Ibuprofen as needed. Alysia Penna, MD

## 2022-03-23 NOTE — Addendum Note (Signed)
Addended by: Wyvonne Lenz on: 03/23/2022 04:47 PM   Modules accepted: Orders

## 2022-04-27 ENCOUNTER — Telehealth: Payer: Self-pay | Admitting: Hematology and Oncology

## 2022-04-27 ENCOUNTER — Other Ambulatory Visit: Payer: Self-pay | Admitting: Hematology and Oncology

## 2022-04-27 DIAGNOSIS — D500 Iron deficiency anemia secondary to blood loss (chronic): Secondary | ICD-10-CM

## 2022-04-27 NOTE — Progress Notes (Unsigned)
Melinda [name] [phone]) [phone]   Fax:(336) [phone]  PROGRESS NOTE  Melinda [name]: Melinda Salisbury, MD as PCP - General Melinda Schanz Thereasa Distance, MD as Consulting Physician (Hematology and Oncology)  Hematological History:  #Iron deficiency anemia: -Patient was seen by [name] from 2014-2020 with Melinda [name] and then Melinda [name].  -07/27/2012: Received IV feraheme 1020 mg dose x 1. Developed hives several days later.  -Currently on oral iron 325 mg once daily -01/30/2021: Received IV monoferric 1000 mg x 1.   #Thrombocytopenia, likely ITP: ---Received steroid therapy during her pregnancy in 2020.    HISTORY OF PRESENTING ILLNESS:  Melinda [name] 37 y.o. female returns for follow-up for iron deficiency anemia and thrombocytopenia, likely ITP.  Patient was last seen in clinic on 10/27/2021.  In the interim she has continued on her p.o. iron therapy.  On exam today Melinda [name] reports ***  She denies fevers, chills, night sweats, shortness of breath, chest pain or cough.  She has no other complaints.  Rest of the 10 point ROS is below.  MEDICAL HISTORY:  Past Medical History:  Diagnosis Date   Anemia    Chickenpox    Early satiety    Herpes genitalia    HPV (human papilloma virus) anogenital infection    Iron deficiency anemia    Night sweat    Scarring, keloid    external genitalia   Thrombocytopenia (HCC)    Vaginal Pap smear, abnormal    Weight loss, non-intentional     SURGICAL HISTORY: Past Surgical History:  Procedure Laterality Date   CESAREAN SECTION N/A 09/20/2018   Procedure: CESAREAN SECTION;  Surgeon: Melinda Running, MD;  Location: [facility] LD [facility]S;  Service: Obstetrics;  Laterality: N/A;   WISDOM TOOTH EXTRACTION      SOCIAL HISTORY: Social History   Socioeconomic History   Marital status: Single    Spouse name: Not on file   Number of children: 0   Years of education: Not on file   Highest education level: Master's degree (e.g.,  MA, MS, MEng, MEd, MSW, MBA)  Occupational History    Employer: [employer]    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[employer]; Runner, broadcasting/film/video  Tobacco Use   Smoking status: Never   Smokeless tobacco: Never  Vaping Use   Vaping Use: Never used  Substance and Sexual Activity   Alcohol use: No    Alcohol/week: 0.0 standard drinks of alcohol   Drug use: No   Sexual activity: Not Currently    Birth control/protection: None  Other Topics Concern   Not on file  Social History Narrative   Not on file   Social Determinants of [name]   Financial Resource Strain: Patient Declined (03/10/2022)   Overall Financial Resource Strain (CARDIA)    Difficulty of Paying Living Expenses: Patient declined  Food Insecurity: Unknown (03/10/2022)   Hunger Vital Sign    Worried About [name] Out of Food in the Last Year: Patient declined    Ran Out of Food in the Last Year: Not on file  Transportation Needs: Patient Declined (03/10/2022)   PRAPARE - Transportation    Lack of Transportation (Medical): Patient declined    Lack of Transportation (Non-Medical): Patient declined  Physical Activity: Insufficiently Active (03/10/2022)   Exercise Vital Sign    Days of Exercise per Week: 1 day    Minutes of Exercise per Session: 30 min  [name]: Patient Declined (03/10/2022)   Melinda [name]  [name]    Feeling of [name] : Patient declined  Social Connections: Unknown (03/10/2022)   Social Connection and Isolation Panel [NHANES]    Frequency of Communication with Friends and Family: Patient declined    Frequency of Social Gatherings with Friends and Family: Patient declined    Attends Religious Services: Patient declined    Database administrator or Organizations: Patient declined    Attends Banker Meetings: Not on file    Marital Status: Patient declined  Intimate Partner Violence: Not At Risk (06/15/2018)   Humiliation, Afraid, Rape, and Kick  [name]    Fear of Current or Ex-Partner: No    Emotionally Abused: No    Physically Abused: No    Sexually Abused: No    FAMILY HISTORY: Family History  Problem Relation Age of Onset   Hypertension Maternal Grandmother    Diabetes Maternal Grandmother    Hypertension Mother    Other Father        Leukopenia.   Hypertension Maternal Uncle     ALLERGIES:  has No Known Allergies.  MEDICATIONS:  Current Outpatient Medications  Medication Sig Dispense Refill   acetaminophen (TYLENOL) 325 MG tablet Take 2 tablets (650 mg total) by mouth every 4 (four) hours as needed for up to 30 doses for mild pain. 30 tablet 1   ferrous sulfate 325 (65 FE) MG tablet Take 1 tablet (325 mg total) by mouth daily. 30 tablet 3   Prenat-Fe Carbonyl-FA-Omega 3 (ONE-A-DAY WOMENS PRENATAL 1) 28-0.8-235 MG CAPS Take 1 capsule by mouth daily. 30 capsule 0   valACYclovir (VALTREX) 500 MG tablet Take 1 tablet (500 mg total) by mouth 2 (two) times daily. 60 tablet 1   XULANE 150-35 MCG/24HR transdermal patch 1 patch once a week.     No current facility-administered medications for this visit.    REVIEW OF SYSTEMS:   Constitutional: ( - ) fevers, ( - )  chills , ( - ) night sweats Eyes: ( - ) blurriness of vision, ( - ) double vision, ( - ) watery eyes Ears, nose, mouth, throat, and face: ( - ) mucositis, ( - ) sore throat Respiratory: ( - ) cough, (- ) dyspnea, ( - ) wheezes Cardiovascular: ( - ) palpitation, ( - ) chest discomfort, ( - ) lower extremity swelling Gastrointestinal:  ( - ) nausea, ( - ) heartburn, ( - ) change in bowel habits Skin: ( - ) abnormal skin rashes Lymphatics: ( - ) new lymphadenopathy, ( - ) easy bruising Neurological: ( - ) numbness, ( - ) tingling, ( - ) new weaknesses Behavioral/Psych: ( - ) mood change, ( - ) new changes  All other systems were reviewed with the patient and are negative.  PHYSICAL EXAMINATION: ECOG PERFORMANCE STATUS: 0 - Asymptomatic  There were no  vitals filed for this visit.  There were no vitals filed for this visit.   GENERAL: well appearing female in NAD  SKIN: skin color, texture, turgor are normal, no rashes or significant lesions EYES: conjunctiva are pink and non-injected, sclera clear LUNGS: clear to auscultation and percussion with normal breathing effort HEART: regular rate & rhythm and no murmurs and no lower extremity edema Musculoskeletal: no cyanosis of digits and no clubbing  PSYCH: alert & oriented x 3, fluent speech NEURO: no focal motor/sensory deficits  LABORATORY DATA:  I have reviewed the data as listed    Latest Ref Rng & Units 01/27/2022    8:11 AM 10/27/2021  10:27 AM 08/06/2021    2:10 PM  CBC  WBC 4.0 - 10.5 K/uL 3.2  4.6  6.5   Hemoglobin 12.0 - 15.0 g/dL 82.9  56.2  13.0   Hematocrit 36.0 - 46.0 % 37.8  38.9  37.1   Platelets 150 - 400 K/uL 64  65  83        Latest Ref Rng & Units 01/27/2022    8:11 AM 10/27/2021   10:27 AM 04/28/2021   10:03 AM  CMP  Glucose 70 - 99 mg/dL 63  865  86   BUN 6 - 20 mg/dL 8  8  10    Creatinine 0.44 - 1.00 mg/dL 7.84  6.96  2.95   Sodium 135 - 145 mmol/L 139  137  137   Potassium 3.5 - 5.1 mmol/L 4.0  3.8  4.0   Chloride 98 - 111 mmol/L 109  107  106   CO2 22 - 32 mmol/L 25  26  27    Calcium 8.9 - 10.3 mg/dL 8.5  8.4  9.1   Total Protein 6.5 - 8.1 g/dL 6.5  6.8  7.0   Total Bilirubin 0.3 - 1.2 mg/dL 0.5  0.7  0.6   Alkaline Phos 38 - 126 U/L 43  44  52   AST 15 - 41 U/L 13  18  13    ALT 0 - 44 U/L 12  24  11     ASSESSMENT & PLAN Nyasiah Moffet is a 37 y.o. 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   #Iron deficiency anemia 2/2 heavy menstrual bleeding: --Received IV monoferric 1000 mg x 1 dose on 01/30/2021 --Continue to incorporate iron rich foods into diet. --Currently on birth control and under the care of gynecologist --Labs today show *** --Currently on ferrous sulfate 325 mg once daily.  Recommend to continue with a source of vitamin C. --Needs to premedicate for future IV iron infusions as patient had mild rash/pruritis with most recent infusion.   #Thrombocytopenia, likely ITP: --Patient received steroids during her pregnancy in 2020 with improvement of platelet counts. Otherwise, she has not received additional therapy --Patient denies signs of bleeding except for menstrual cycle.  --Workup from 01/20/2021 showed no evidence of additional deficiencies, hepatitis B or C.  Immature platelet fraction was elevated to suggests ITP as underlying etiology. --Continue to monitor with strict precautions for bleeding.  Follow up: -- Labs every 3 months with clinic visits every 6.  No orders of the defined types were placed in this encounter.   All questions were answered. The patient knows to call the clinic with any problems, questions or concerns.  I have spent a total of 30 minutes minutes of face-to-face and non-face-to-face time, preparing to see the patient, performing a medically appropriate examination, counseling and educating the patient, ordering tests, documenting clinical information in the electronic [name] record, and care coordination.   Ulysees Barns, MD Department of Hematology/Oncology [name] at [name] Phone: [phone] Pager: [phone] Email: [email or web address]

## 2022-04-28 ENCOUNTER — Inpatient Hospital Stay: Payer: BC Managed Care – PPO

## 2022-04-28 ENCOUNTER — Inpatient Hospital Stay: Payer: BC Managed Care – PPO | Admitting: Hematology and Oncology

## 2022-05-25 NOTE — Progress Notes (Signed)
[facility] Telephone:(336) [phone]   Fax:(336) [phone]  PROGRESS NOTE  Patient Care Team: Nelwyn Salisbury, MD as PCP - General Leonides Schanz Thereasa Distance, MD as Consulting Physician (Hematology and Oncology)  Hematological History:  #Iron deficiency anemia: -Patient was seen by [facility]emat[facility] team from 2014-2020 with Dr. Gaylyn Rong and then Dr. Bertis Ruddy.  -07/27/2012: Received IV feraheme 1020 mg dose x 1. Developed hives several days later.  -Currently on oral iron 325 mg once daily -01/30/2021: Received IV monoferric 1000 mg x 1.   #Thrombocytopenia, likely ITP: ---Received steroid therapy during her pregnancy in 2020.    HISTORY OF PRESENTING ILLNESS:  Melinda [name] 37 y.o. female returns for follow-up for iron deficiency anemia and thrombocytopenia, likely ITP.  Patient was last seen in clinic on 04/28/2021.  In the interim she has continued on her p.o. iron therapy.  On exam today Melinda [name] reports she has been well overall interim since her last visit 6 months ago.  She reports that she did recently have her menstrual cycle and was extremely tired when that happened.  She was "tired all week".  She reports that she was previously using a patch for birth control but now has Nexplanon in place.  She reports that her menstrual cycles did feel a little lighter after that.  She reports she is doing her best to take her iron pills daily with orange juice as well as eating iron rich foods including greens, broccoli, and beans.  She is not eating beets or liver.  She reports that her energy level is about a 7 out of 10 today.  She is not having any bleeding or bruising elsewhere.  She otherwise denies any lightheadedness, dizziness or shortness of breath.  She is not having any ice cravings.  She denies fevers, chills, night sweats, shortness of breath, chest pain or cough.  She has no other complaints.  Rest of the 10 point ROS is below.  MEDICAL HISTORY:  Past Medical History:  Diagnosis  Date   Anemia    Chickenpox    Early satiety    Herpes genitalia    HPV (human papilloma virus) anogenital infection    Iron deficiency anemia    Night sweat    Scarring, keloid    external genitalia   Thrombocytopenia (HCC)    Vaginal Pap smear, abnormal    Weight loss, non-intentional     SURGICAL HISTORY: Past Surgical History:  Procedure Laterality Date   CESAREAN SECTION N/A 09/20/2018   Procedure: CESAREAN SECTION;  Surgeon: Kathrynn Running, MD;  Location: [facility] LD [facility]S;  Service: Obstetrics;  Laterality: N/A;   WISDOM TOOTH EXTRACTION      SOCIAL HISTORY: Social History   Socioeconomic History   Marital status: Single    Spouse name: Not on file   Number of children: 0   Years of education: Not on file   Highest education level: Master's degree (e.g., MA, MS, MEng, MEd, MSW, MBA)  Occupational History    Employer: [employer]    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[employer]; Runner, broadcasting/film/video  Tobacco Use   Smoking status: Never   Smokeless tobacco: Never  Vaping Use   Vaping Use: Never used  Substance and Sexual Activity   Alcohol use: No    Alcohol/week: 0.0 standard drinks of alcohol   Drug use: No   Sexual activity: Not Currently    Birth control/protection: None  Other Topics Concern   Not on file  Social History Narrative  Not on file   Social Determinants of Health   Financial Resource Strain: Patient Declined (03/10/2022)   Overall Financial Resource Strain (CARDIA)    Difficulty of Paying Living Expenses: Patient declined  Food Insecurity: Unknown (03/10/2022)   Hunger Vital Sign    Worried About Running Out of Food in the Last Year: Patient declined    Ran Out of Food in the Last Year: Not on file  Transportation Needs: Patient Declined (03/10/2022)   PRAPARE - Transportation    Lack of Transportation (Medical): Patient declined    Lack of Transportation (Non-Medical): Patient declined  Physical Activity: Insufficiently Active (03/10/2022)   Exercise  Vital Sign    Days of Exercise per Week: 1 day    Minutes of Exercise per Session: 30 min  Stress: Patient Declined (03/10/2022)   [employer] of Occupational Health - Occupational Stress Questionnaire    Feeling of Stress : Patient declined  Social Connections: Unknown (03/10/2022)   Social Connection and Isolation Panel [NHANES]    Frequency of Communication with Friends and Family: Patient declined    Frequency of Social Gatherings with Friends and Family: Patient declined    Attends Religious Services: Patient declined    Database administrator or Organizations: Patient declined    Attends Banker Meetings: Not on file    Marital Status: Patient declined  Intimate Partner Violence: Not At Risk (06/15/2018)   Humiliation, Afraid, Rape, and Kick questionnaire    Fear of Current or Ex-Partner: No    Emotionally Abused: No    Physically Abused: No    Sexually Abused: No    FAMILY HISTORY: Family History  Problem Relation Age of Onset   Hypertension Maternal Grandmother    Diabetes Maternal Grandmother    Hypertension Mother    Other Father        Leukopenia.   Hypertension Maternal Uncle     ALLERGIES:  has No Known Allergies.  MEDICATIONS:  Current Outpatient Medications  Medication Sig Dispense Refill   acetaminophen (TYLENOL) 325 MG tablet Take 2 tablets (650 mg total) by mouth every 4 (four) hours as needed for up to 30 doses for mild pain. 30 tablet 1   escitalopram (LEXAPRO) 10 MG tablet Take 1 tablet (10 mg total) by mouth daily. 30 tablet 2   ferrous sulfate 325 (65 FE) MG tablet Take 1 tablet (325 mg total) by mouth daily. 30 tablet 3   Prenat-Fe Carbonyl-FA-Omega 3 (ONE-A-DAY WOMENS PRENATAL 1) 28-0.8-235 MG CAPS Take 1 capsule by mouth daily. 30 capsule 0   valACYclovir (VALTREX) 500 MG tablet Take 1 tablet (500 mg total) by mouth 2 (two) times daily. 60 tablet 1   XULANE 150-35 MCG/24HR transdermal patch 1 patch once a week.     No current  facility-administered medications for this visit.    REVIEW OF SYSTEMS:   Constitutional: ( - ) fevers, ( - )  chills , ( - ) night sweats Eyes: ( - ) blurriness of vision, ( - ) double vision, ( - ) watery eyes Ears, nose, mouth, throat, and face: ( - ) mucositis, ( - ) sore throat Respiratory: ( - ) cough, (- ) dyspnea, ( - ) wheezes Cardiovascular: ( - ) palpitation, ( - ) chest discomfort, ( - ) lower extremity swelling Gastrointestinal:  ( - ) nausea, ( - ) heartburn, ( - ) change in bowel habits Skin: ( - ) abnormal skin rashes Lymphatics: ( - ) new lymphadenopathy, ( - )  easy bruising Neurological: ( - ) numbness, ( - ) tingling, ( - ) new weaknesses Behavioral/Psych: ( - ) mood change, ( - ) new changes  All other systems were reviewed with the patient and are negative.  PHYSICAL EXAMINATION: ECOG PERFORMANCE STATUS: 0 - Asymptomatic  Vitals:   05/26/22 1508  BP: 137/65  Pulse: 94  Resp: 17  Temp: 98.8 F (37.1 C)  SpO2: 100%    Filed Weights   05/26/22 1508  Weight: 144 lb 3.2 oz (65.4 kg)     GENERAL: well appearing female in NAD  SKIN: skin color, texture, turgor are normal, no rashes or significant lesions EYES: conjunctiva are pink and non-injected, sclera clear LUNGS: clear to auscultation and percussion with normal breathing effort HEART: regular rate & rhythm and no murmurs and no lower extremity edema Musculoskeletal: no cyanosis of digits and no clubbing  PSYCH: alert & oriented x 3, fluent speech NEURO: no focal motor/sensory deficits  LABORATORY DATA:  I have reviewed the data as listed    Latest Ref Rng & Units 05/26/2022    2:39 PM 01/27/2022    8:11 AM 10/27/2021   10:27 AM  CBC  WBC 4.0 - 10.5 K/uL 4.8  3.2  4.6   Hemoglobin 12.0 - 15.0 g/dL 13.0  86.5  78.4   Hematocrit 36.0 - 46.0 % 34.4  37.8  38.9   Platelets 150 - 400 K/uL 84  64  65        Latest Ref Rng & Units 05/26/2022    2:39 PM 01/27/2022    8:11 AM 10/27/2021   10:27 AM   CMP  Glucose 70 - 99 mg/dL 83  63  696   BUN 6 - 20 mg/dL 13  8  8    Creatinine 0.44 - 1.00 mg/dL 2.95  2.84  1.32   Sodium 135 - 145 mmol/L 138  139  137   Potassium 3.5 - 5.1 mmol/L 3.8  4.0  3.8   Chloride 98 - 111 mmol/L 109  109  107   CO2 22 - 32 mmol/L 25  25  26    Calcium 8.9 - 10.3 mg/dL 8.5  8.5  8.4   Total Protein 6.5 - 8.1 g/dL 6.8  6.5  6.8   Total Bilirubin 0.3 - 1.2 mg/dL 0.3  0.5  0.7   Alkaline Phos 38 - 126 U/L 51  43  44   AST 15 - 41 U/L 17  13  18    ALT 0 - 44 U/L 21  12  24     ASSESSMENT & PLAN Melinda [name] is a 37 y.o. 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   #Iron deficiency anemia 2/2 heavy menstrual bleeding: --Received IV monoferric 1000 mg x 1 dose on 01/30/2021 --Continue to incorporate iron rich foods into diet. --Currently on birth control and under the care of gynecologist --Labs today show white blood cell 4.8, hemoglobin 11.1, MCV 88, and platelets of 84. Iron panel show no evidence of deficiency.  --Currently on ferrous sulfate 325 mg once daily. Recommend to continue with a source of vitamin C. --Need to premedicate for future IV iron infusions as patient had mild rash/pruritis with most recent infusion.   #Thrombocytopenia, likely ITP: --Patient received steroids during her pregnancy in 2020 with improvement of platelet counts. Otherwise, she has not received additional therapy --Patient denies signs of bleeding except for menstrual cycle.  --Workup from 01/20/2021  showed no evidence of additional deficiencies, hepatitis B or C.  Immature platelet fraction was elevated to suggests ITP as underlying etiology. --labs today show Plt 84 --Continue to monitor with strict precautions for bleeding.  Follow up: -- Labs every 3 months with clinic visits every 6.  No orders of the defined types were placed in this encounter.   All questions were answered. The patient knows to call the clinic with  any problems, questions or concerns.  I have spent a total of 30 minutes minutes of face-to-face and non-face-to-face time, preparing to see the patient, performing a medically appropriate examination, counseling and educating the patient, ordering tests, documenting clinical information in the electronic health record, and care coordination.   Ulysees Barns, MD Department of Hematology/Oncology [facility] Phone: [phone] Pager: [phone] Email: [email or web address]

## 2022-05-26 ENCOUNTER — Inpatient Hospital Stay: Payer: BC Managed Care – PPO | Attending: Hematology and Oncology

## 2022-05-26 ENCOUNTER — Inpatient Hospital Stay (HOSPITAL_BASED_OUTPATIENT_CLINIC_OR_DEPARTMENT_OTHER): Payer: BC Managed Care – PPO | Admitting: Hematology and Oncology

## 2022-05-26 ENCOUNTER — Other Ambulatory Visit: Payer: Self-pay

## 2022-05-26 VITALS — BP 137/65 | HR 94 | Temp 98.8°F | Resp 17 | Wt 144.2 lb

## 2022-05-26 DIAGNOSIS — D500 Iron deficiency anemia secondary to blood loss (chronic): Secondary | ICD-10-CM | POA: Diagnosis present

## 2022-05-26 DIAGNOSIS — Z79624 Long term (current) use of inhibitors of nucleotide synthesis: Secondary | ICD-10-CM | POA: Insufficient documentation

## 2022-05-26 DIAGNOSIS — D696 Thrombocytopenia, unspecified: Secondary | ICD-10-CM

## 2022-05-26 DIAGNOSIS — Z79899 Other long term (current) drug therapy: Secondary | ICD-10-CM | POA: Diagnosis not present

## 2022-05-26 DIAGNOSIS — N920 Excessive and frequent menstruation with regular cycle: Secondary | ICD-10-CM | POA: Insufficient documentation

## 2022-05-26 LAB — CBC WITH DIFFERENTIAL (CANCER CENTER ONLY)
Abs Immature Granulocytes: 0.01 10*3/uL (ref 0.00–0.07)
Basophils Absolute: 0 10*3/uL (ref 0.0–0.1)
Basophils Relative: 0 %
Eosinophils Absolute: 0.2 10*3/uL (ref 0.0–0.5)
Eosinophils Relative: 5 %
HCT: 34.4 % — ABNORMAL LOW (ref 36.0–46.0)
Hemoglobin: 11.1 g/dL — ABNORMAL LOW (ref 12.0–15.0)
Immature Granulocytes: 0 %
Lymphocytes Relative: 47 %
Lymphs Abs: 2.2 10*3/uL (ref 0.7–4.0)
MCH: 28.4 pg (ref 26.0–34.0)
MCHC: 32.3 g/dL (ref 30.0–36.0)
MCV: 88 fL (ref 80.0–100.0)
Monocytes Absolute: 0.3 10*3/uL (ref 0.1–1.0)
Monocytes Relative: 7 %
Neutro Abs: 2 10*3/uL (ref 1.7–7.7)
Neutrophils Relative %: 41 %
Platelet Count: 84 10*3/uL — ABNORMAL LOW (ref 150–400)
RBC: 3.91 MIL/uL (ref 3.87–5.11)
RDW: 13.6 % (ref 11.5–15.5)
WBC Count: 4.8 10*3/uL (ref 4.0–10.5)
nRBC: 0 % (ref 0.0–0.2)

## 2022-05-26 LAB — IMMATURE PLATELET FRACTION: Immature Platelet Fraction: 24 % — ABNORMAL HIGH (ref 1.2–8.6)

## 2022-05-26 LAB — IRON AND IRON BINDING CAPACITY (CC-WL,HP ONLY)
Iron: 45 ug/dL (ref 28–170)
Saturation Ratios: 11 % (ref 10.4–31.8)
TIBC: 426 ug/dL (ref 250–450)
UIBC: 381 ug/dL (ref 148–442)

## 2022-05-26 LAB — CMP (CANCER CENTER ONLY)
ALT: 21 U/L (ref 0–44)
AST: 17 U/L (ref 15–41)
Albumin: 4.1 g/dL (ref 3.5–5.0)
Alkaline Phosphatase: 51 U/L (ref 38–126)
Anion gap: 4 — ABNORMAL LOW (ref 5–15)
BUN: 13 mg/dL (ref 6–20)
CO2: 25 mmol/L (ref 22–32)
Calcium: 8.5 mg/dL — ABNORMAL LOW (ref 8.9–10.3)
Chloride: 109 mmol/L (ref 98–111)
Creatinine: 0.86 mg/dL (ref 0.44–1.00)
GFR, Estimated: >60 mL/min (ref >60)
Glucose, Bld: 83 mg/dL (ref 70–99)
Potassium: 3.8 mmol/L (ref 3.5–5.1)
Sodium: 138 mmol/L (ref 135–145)
Total Bilirubin: 0.3 mg/dL (ref 0.3–1.2)
Total Protein: 6.8 g/dL (ref 6.5–8.1)

## 2022-05-26 LAB — RETIC PANEL
Immature Retic Fract: 17.2 % — ABNORMAL HIGH (ref 2.3–15.9)
RBC.: 3.86 MIL/uL — ABNORMAL LOW (ref 3.87–5.11)
Retic Count, Absolute: 58.3 10*3/uL (ref 19.0–186.0)
Retic Ct Pct: 1.5 % (ref 0.4–3.1)
Reticulocyte Hemoglobin: 31 pg (ref >27.9)

## 2022-05-26 LAB — LACTATE DEHYDROGENASE: LDH: 145 U/L (ref 98–192)

## 2022-05-27 LAB — FERRITIN: Ferritin: 3 ng/mL — ABNORMAL LOW (ref 11–307)

## 2022-06-02 ENCOUNTER — Encounter: Payer: Self-pay | Admitting: Family Medicine

## 2022-06-02 ENCOUNTER — Ambulatory Visit (INDEPENDENT_AMBULATORY_CARE_PROVIDER_SITE_OTHER): Payer: BC Managed Care – PPO | Admitting: Family Medicine

## 2022-06-02 VITALS — BP 108/76 | HR 94 | Temp 98.8°F | Wt 143.4 lb

## 2022-06-02 DIAGNOSIS — F419 Anxiety disorder, unspecified: Secondary | ICD-10-CM

## 2022-06-02 MED ORDER — ESCITALOPRAM OXALATE 10 MG PO TABS
10.0000 mg | ORAL_TABLET | Freq: Every day | ORAL | 2 refills | Status: DC
Start: 2022-06-02 — End: 2022-12-19

## 2022-06-02 NOTE — Progress Notes (Signed)
   Subjective:    Patient ID: Melinda [name], female    DOB: [birth year], 37 y.o.   MRN: [id number]  HPI Here for intermittent periods of feeling pressure in her chest, mild abdominal cramps, and diarrhea. No fever or ST or cough. No urinary symptoms. She had been experiencing these same symptoms for the past 4 days, but today she feels fine. She thinks this all comes from stress. Her job is stressful and she has a 87 year old child, so she has very little time to relax. Her appetite is intact, but she often has trouble sleeping. She has never been treated for stress before.    Review of Systems  Constitutional: Negative.   Respiratory:  Positive for chest tightness. Negative for cough and shortness of breath.   Cardiovascular: Negative.   Gastrointestinal:  Positive for abdominal pain and diarrhea. Negative for abdominal distention, blood in stool, constipation, nausea and vomiting.  Genitourinary: Negative.   Psychiatric/Behavioral:  Positive for decreased concentration and sleep disturbance. Negative for agitation, behavioral problems, confusion and dysphoric mood. The patient is nervous/anxious.        Objective:   Physical Exam Constitutional:      Appearance: Normal appearance. She is not ill-appearing.  Cardiovascular:     Rate and Rhythm: Normal rate and regular rhythm.     Pulses: Normal pulses.     Heart sounds: Normal heart sounds.  Pulmonary:     Effort: Pulmonary effort is normal.     Breath sounds: Normal breath sounds.  Chest:     Chest wall: No tenderness.  Abdominal:     General: Abdomen is flat. Bowel sounds are normal. There is no distension.     Palpations: Abdomen is soft. There is no mass.     Tenderness: There is no abdominal tenderness. There is no guarding or rebound.     Hernia: No hernia is present.  Neurological:     General: No focal deficit present.     Mental Status: She is alert and oriented to person, place, and time.  Psychiatric:        Mood and  Affect: Mood normal.        Behavior: Behavior normal.        Thought Content: Thought content normal.           Assessment & Plan:  I agree that her symptoms seem to come from anxiety. We will treat this with Lexapro 10 mg daily. Recheck in 3-4 weeks.  Gershon Crane, MD

## 2022-06-07 ENCOUNTER — Encounter: Payer: Self-pay | Admitting: Hematology and Oncology

## 2022-06-08 ENCOUNTER — Telehealth: Payer: Self-pay | Admitting: Pharmacy Technician

## 2022-06-08 NOTE — Telephone Encounter (Signed)
Dr. Leonides Schanz,  Monoferric is a non-preferred medication with [employer] and will be denied if patient has not failed preferred medication. Preferred medication is Venofer. Would you like to try venofer?

## 2022-06-09 ENCOUNTER — Other Ambulatory Visit: Payer: Self-pay

## 2022-06-14 ENCOUNTER — Ambulatory Visit (INDEPENDENT_AMBULATORY_CARE_PROVIDER_SITE_OTHER): Payer: BC Managed Care – PPO

## 2022-06-14 VITALS — BP 112/77 | HR 93 | Temp 98.7°F | Resp 16 | Ht 62.0 in | Wt 144.8 lb

## 2022-06-14 DIAGNOSIS — D500 Iron deficiency anemia secondary to blood loss (chronic): Secondary | ICD-10-CM | POA: Diagnosis not present

## 2022-06-14 DIAGNOSIS — N920 Excessive and frequent menstruation with regular cycle: Secondary | ICD-10-CM

## 2022-06-14 MED ORDER — SODIUM CHLORIDE 0.9 % IV SOLN
200.0000 mg | Freq: Once | INTRAVENOUS | Status: AC
  Administered 2022-06-14: 200 mg via INTRAVENOUS
  Filled 2022-06-14: qty 10

## 2022-06-14 MED ORDER — ACETAMINOPHEN 325 MG PO TABS: 650.0000 mg | ORAL_TABLET | Freq: Once | ORAL | Status: DC

## 2022-06-14 MED ORDER — DIPHENHYDRAMINE HCL 25 MG PO CAPS: 25.0000 mg | ORAL_CAPSULE | Freq: Once | ORAL | Status: DC

## 2022-06-14 NOTE — Progress Notes (Signed)
Diagnosis: Iron Deficiency Anemia  Provider:  Chilton Greathouse MD  Procedure: IV Infusion  IV Type: Peripheral, IV Location: R Antecubital  Venofer (Iron Sucrose), Dose: 200 mg  Infusion Start Time: 1440  Infusion Stop Time: 1458  Post Infusion IV Care: Observation period completed and Peripheral IV Discontinued  Discharge: Condition: Good, Destination: Home . AVS Declined  Performed by:  Loney Hering, LPN

## 2022-06-25 ENCOUNTER — Ambulatory Visit (INDEPENDENT_AMBULATORY_CARE_PROVIDER_SITE_OTHER): Payer: BC Managed Care – PPO

## 2022-06-25 VITALS — BP 110/77 | HR 81 | Temp 98.4°F | Resp 16 | Ht 61.0 in | Wt 142.6 lb

## 2022-06-25 DIAGNOSIS — D500 Iron deficiency anemia secondary to blood loss (chronic): Secondary | ICD-10-CM | POA: Diagnosis not present

## 2022-06-25 DIAGNOSIS — N920 Excessive and frequent menstruation with regular cycle: Secondary | ICD-10-CM

## 2022-06-25 MED ORDER — DIPHENHYDRAMINE HCL 25 MG PO CAPS
25.0000 mg | ORAL_CAPSULE | Freq: Once | ORAL | Status: AC
  Administered 2022-06-25: 25 mg via ORAL
  Filled 2022-06-25: qty 1

## 2022-06-25 MED ORDER — ACETAMINOPHEN 325 MG PO TABS
650.0000 mg | ORAL_TABLET | Freq: Once | ORAL | Status: AC
  Administered 2022-06-25: 650 mg via ORAL
  Filled 2022-06-25: qty 2

## 2022-06-25 MED ORDER — SODIUM CHLORIDE 0.9 % IV SOLN
200.0000 mg | Freq: Once | INTRAVENOUS | Status: AC
  Administered 2022-06-25: 200 mg via INTRAVENOUS
  Filled 2022-06-25: qty 10

## 2022-06-25 NOTE — Progress Notes (Signed)
Diagnosis: Iron Deficiency Anemia  Provider:  Chilton Greathouse MD  Procedure: IV Infusion  IV Type: Peripheral, IV Location: R Forearm  Venofer (Iron Sucrose), Dose: 200 mg  Infusion Start Time: 0958  Infusion Stop Time: 1015  Post Infusion IV Care: Peripheral IV Discontinued  Discharge: Condition: Good, Destination: Home . AVS Declined  Performed by:  Loney Hering, LPN

## 2022-07-05 ENCOUNTER — Ambulatory Visit: Payer: BC Managed Care – PPO | Admitting: Family Medicine

## 2022-07-09 ENCOUNTER — Ambulatory Visit (INDEPENDENT_AMBULATORY_CARE_PROVIDER_SITE_OTHER): Payer: BC Managed Care – PPO

## 2022-07-09 VITALS — BP 108/74 | HR 83 | Temp 98.3°F | Resp 20 | Ht 61.0 in | Wt 141.8 lb

## 2022-07-09 DIAGNOSIS — D500 Iron deficiency anemia secondary to blood loss (chronic): Secondary | ICD-10-CM

## 2022-07-09 DIAGNOSIS — N920 Excessive and frequent menstruation with regular cycle: Secondary | ICD-10-CM

## 2022-07-09 MED ORDER — DIPHENHYDRAMINE HCL 25 MG PO CAPS
25.0000 mg | ORAL_CAPSULE | Freq: Once | ORAL | Status: AC
  Administered 2022-07-09: 25 mg via ORAL
  Filled 2022-07-09: qty 1

## 2022-07-09 MED ORDER — SODIUM CHLORIDE 0.9 % IV SOLN
200.0000 mg | Freq: Once | INTRAVENOUS | Status: AC
  Administered 2022-07-09: 200 mg via INTRAVENOUS
  Filled 2022-07-09: qty 10

## 2022-07-09 MED ORDER — ACETAMINOPHEN 325 MG PO TABS
650.0000 mg | ORAL_TABLET | Freq: Once | ORAL | Status: AC
  Administered 2022-07-09: 650 mg via ORAL
  Filled 2022-07-09: qty 2

## 2022-07-09 NOTE — Progress Notes (Signed)
Diagnosis: Iron Deficiency Anemia  Provider:  Chilton Greathouse MD  Procedure: IV Infusion  IV Type: Peripheral, IV Location: R Forearm  Venofer (Iron Sucrose), Dose: 200 mg  Infusion Start Time: 0939  Infusion Stop Time: 0955  Post Infusion IV Care: Patient declined observation and Peripheral IV Discontinued  Discharge: Condition: Stable, Destination: Home . AVS Declined  Performed by:  Wyvonne Lenz, RN

## 2022-07-14 ENCOUNTER — Ambulatory Visit (INDEPENDENT_AMBULATORY_CARE_PROVIDER_SITE_OTHER): Payer: BC Managed Care – PPO

## 2022-07-14 VITALS — BP 112/72 | HR 91 | Temp 98.6°F | Resp 18 | Ht 62.0 in | Wt 142.4 lb

## 2022-07-14 DIAGNOSIS — N920 Excessive and frequent menstruation with regular cycle: Secondary | ICD-10-CM | POA: Diagnosis not present

## 2022-07-14 DIAGNOSIS — D500 Iron deficiency anemia secondary to blood loss (chronic): Secondary | ICD-10-CM

## 2022-07-14 MED ORDER — DIPHENHYDRAMINE HCL 25 MG PO CAPS: 25.0000 mg | ORAL_CAPSULE | Freq: Once | ORAL | Status: DC

## 2022-07-14 MED ORDER — SODIUM CHLORIDE 0.9 % IV SOLN
200.0000 mg | Freq: Once | INTRAVENOUS | Status: AC
  Administered 2022-07-14: 200 mg via INTRAVENOUS
  Filled 2022-07-14: qty 10

## 2022-07-14 MED ORDER — ACETAMINOPHEN 325 MG PO TABS: 650.0000 mg | ORAL_TABLET | Freq: Once | ORAL | Status: DC

## 2022-07-14 NOTE — Progress Notes (Signed)
Diagnosis: Iron Deficiency Anemia  Provider:  Chilton Greathouse MD  Procedure: IV Infusion  IV Type: Peripheral, IV Location: R Hand  Venofer (Iron Sucrose), Dose: 200 mg  Infusion Start Time: 0954  Infusion Stop Time: 1010  Post Infusion IV Care: Patient declined observation and Peripheral IV Discontinued  Discharge: Condition: Good, Destination: Home . AVS Declined  Performed by:  Marlow Baars [place], RN

## 2022-07-21 ENCOUNTER — Ambulatory Visit: Payer: BC Managed Care – PPO

## 2022-07-21 VITALS — BP 116/78 | HR 87 | Temp 98.3°F | Resp 16 | Ht 62.0 in | Wt 143.8 lb

## 2022-07-21 DIAGNOSIS — D500 Iron deficiency anemia secondary to blood loss (chronic): Secondary | ICD-10-CM | POA: Diagnosis not present

## 2022-07-21 DIAGNOSIS — N920 Excessive and frequent menstruation with regular cycle: Secondary | ICD-10-CM

## 2022-07-21 MED ORDER — SODIUM CHLORIDE 0.9 % IV SOLN
200.0000 mg | Freq: Once | INTRAVENOUS | Status: AC
  Administered 2022-07-21: 200 mg via INTRAVENOUS
  Filled 2022-07-21: qty 10

## 2022-07-21 MED ORDER — ACETAMINOPHEN 325 MG PO TABS: 650.0000 mg | ORAL_TABLET | Freq: Once | ORAL | Status: DC

## 2022-07-21 MED ORDER — DIPHENHYDRAMINE HCL 25 MG PO CAPS: 25.0000 mg | ORAL_CAPSULE | Freq: Once | ORAL | Status: DC

## 2022-07-21 NOTE — Addendum Note (Signed)
Addended by: Primus Bravo E on: 07/21/2022 11:09 AM   Modules accepted: Orders

## 2022-07-21 NOTE — Progress Notes (Signed)
Diagnosis: Iron Deficiency Anemia  Provider:  Chilton Greathouse MD  Procedure: IV Infusion  IV Type: Peripheral, IV Location: L wrist  Venofer (Iron Sucrose), Dose: 200 mg  Infusion Start Time: 0937  Infusion Stop Time: 0953  Post Infusion IV Care: Patient declined observation and Peripheral IV Discontinued  Discharge: Condition: Stable, Destination: Home . AVS Provided  Performed by:  Wyvonne Lenz, RN

## 2022-07-28 ENCOUNTER — Other Ambulatory Visit: Payer: Medicaid Other

## 2022-08-16 ENCOUNTER — Other Ambulatory Visit: Payer: Self-pay | Admitting: Hematology and Oncology

## 2022-08-16 ENCOUNTER — Inpatient Hospital Stay: Payer: BC Managed Care – PPO | Attending: Hematology and Oncology

## 2022-08-16 ENCOUNTER — Inpatient Hospital Stay: Payer: BC Managed Care – PPO | Admitting: Hematology and Oncology

## 2022-08-16 DIAGNOSIS — D696 Thrombocytopenia, unspecified: Secondary | ICD-10-CM

## 2022-08-16 DIAGNOSIS — D500 Iron deficiency anemia secondary to blood loss (chronic): Secondary | ICD-10-CM

## 2022-08-16 NOTE — Progress Notes (Unsigned)
[facility] [phone]   [phone]  PROGRESS NOTE  Patient Care Team: Nelwyn Salisbury, MD as PCP - General Leonides Schanz Thereasa Distance, MD as Consulting Physician (Hematology and Oncology)  Hematological History:  #Iron deficiency anemia: -Patient was seen by [facility] Hematology team from 2014-2020 with Dr. Gaylyn Rong and then Dr. Bertis Ruddy.  -07/27/2012: Received IV feraheme 1020 mg dose x 1. Developed hives several days later.  -Currently on oral iron 325 mg once daily -01/30/2021: Received IV monoferric 1000 mg x 1.   #Thrombocytopenia, likely ITP: ---Received steroid therapy during her pregnancy in 2020.    HISTORY OF PRESENTING ILLNESS:  Melinda [name] 37 y.o. female returns for follow-up for iron deficiency anemia and thrombocytopenia, likely ITP.  Patient was last seen in clinic on 05/26/2022.  In the interim she has continued on her p.o. iron therapy.  On exam today Mrs. Backs reports ***  She denies fevers, chills, night sweats, shortness of breath, chest pain or cough.  She has no other complaints.  Rest of the 10 point ROS is below.  MEDICAL HISTORY:  Past Medical History:  Diagnosis Date   Anemia    Chickenpox    Early satiety    Herpes genitalia    HPV (human papilloma virus) anogenital infection    Iron deficiency anemia    Night sweat    Scarring, keloid    external genitalia   Thrombocytopenia (HCC)    Vaginal Pap smear, abnormal    Weight loss, non-intentional     SURGICAL HISTORY: Past Surgical History:  Procedure Laterality Date   CESAREAN SECTION N/A 09/20/2018   Procedure: CESAREAN SECTION;  Surgeon: Kathrynn Running, MD;  Location: [facility] LD ORS;  Service: Obstetrics;  Laterality: N/A;   WISDOM TOOTH EXTRACTION      SOCIAL HISTORY: Social History   Socioeconomic History   Marital status: Single    Spouse name: Not on file   Number of children: 0   Years of education: Not on file   Highest education level: Master's degree (e.g.,  MA, MS, MEng, MEd, MSW, MBA)  Occupational History    Employer: [employer]    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[employer]; Runner, broadcasting/film/video  Tobacco Use   Smoking status: Never   Smokeless tobacco: Never  Vaping Use   Vaping status: Never Used  Substance and Sexual Activity   Alcohol use: No    Alcohol/week: 0.0 standard drinks of alcohol   Drug use: No   Sexual activity: Not Currently    Birth control/protection: None  Other Topics Concern   Not on file  Social History Narrative   Not on file   Social Determinants of Health   Financial Resource Strain: Patient Declined (03/10/2022)   Overall Financial Resource Strain (CARDIA)    Difficulty of Paying Living Expenses: Patient declined  Food Insecurity: Unknown (03/10/2022)   Hunger Vital Sign    Worried About Running Out of Food in the Last Year: Patient declined    Ran Out of Food in the Last Year: Not on file  Transportation Needs: Patient Declined (03/10/2022)   PRAPARE - Transportation    Lack of Transportation (Medical): Patient declined    Lack of Transportation (Non-Medical): Patient declined  Physical Activity: Insufficiently Active (03/10/2022)   Exercise Vital Sign    Days of Exercise per Week: 1 day    Minutes of Exercise per Session: 30 min  Stress: Patient Declined (03/10/2022)   [employer] of Occupational Health - Occupational Stress  Questionnaire    Feeling of Stress : Patient declined  Social Connections: Unknown (03/10/2022)   Social Connection and Isolation Panel [NHANES]    Frequency of Communication with Friends and Family: Patient declined    Frequency of Social Gatherings with Friends and Family: Patient declined    Attends Religious Services: Patient declined    Database administrator or Organizations: Patient declined    Attends Banker Meetings: Not on file    Marital Status: Patient declined  Intimate Partner Violence: Unknown (04/13/2021)   Received from [facility]   HITS     Physically Hurt: Not on file    Insult or Talk Down To: Not on file    Threaten Physical Harm: Not on file    Scream or Curse: Not on file    FAMILY HISTORY: Family History  Problem Relation Age of Onset   Hypertension Maternal Grandmother    Diabetes Maternal Grandmother    Hypertension Mother    Other Father        Leukopenia.   Hypertension Maternal Uncle     ALLERGIES:  has No Known Allergies.  MEDICATIONS:  Current Outpatient Medications  Medication Sig Dispense Refill   acetaminophen (TYLENOL) 325 MG tablet Take 2 tablets (650 mg total) by mouth every 4 (four) hours as needed for up to 30 doses for mild pain. 30 tablet 1   escitalopram (LEXAPRO) 10 MG tablet Take 1 tablet (10 mg total) by mouth daily. 30 tablet 2   ferrous sulfate 325 (65 FE) MG tablet Take 1 tablet (325 mg total) by mouth daily. 30 tablet 3   Prenat-Fe Carbonyl-FA-Omega 3 (ONE-A-DAY WOMENS PRENATAL 1) 28-0.8-235 MG CAPS Take 1 capsule by mouth daily. 30 capsule 0   valACYclovir (VALTREX) 500 MG tablet Take 1 tablet (500 mg total) by mouth 2 (two) times daily. 60 tablet 1   XULANE 150-35 MCG/24HR transdermal patch 1 patch once a week.     No current facility-administered medications for this visit.    REVIEW OF SYSTEMS:   Constitutional: ( - ) fevers, ( - )  chills , ( - ) night sweats Eyes: ( - ) blurriness of vision, ( - ) double vision, ( - ) watery eyes Ears, nose, mouth, throat, and face: ( - ) mucositis, ( - ) sore throat Respiratory: ( - ) cough, (- ) dyspnea, ( - ) wheezes Cardiovascular: ( - ) palpitation, ( - ) chest discomfort, ( - ) lower extremity swelling Gastrointestinal:  ( - ) nausea, ( - ) heartburn, ( - ) change in bowel habits Skin: ( - ) abnormal skin rashes Lymphatics: ( - ) new lymphadenopathy, ( - ) easy bruising Neurological: ( - ) numbness, ( - ) tingling, ( - ) new weaknesses Behavioral/Psych: ( - ) mood change, ( - ) new changes  All other systems were reviewed with the  patient and are negative.  PHYSICAL EXAMINATION: ECOG PERFORMANCE STATUS: 0 - Asymptomatic  There were no vitals filed for this visit.   There were no vitals filed for this visit.    GENERAL: well appearing female in NAD  SKIN: skin color, texture, turgor are normal, no rashes or significant lesions EYES: conjunctiva are pink and non-injected, sclera clear LUNGS: clear to auscultation and percussion with normal breathing effort HEART: regular rate & rhythm and no murmurs and no lower extremity edema Musculoskeletal: no cyanosis of digits and no clubbing  PSYCH: alert & oriented x 3, fluent speech NEURO:  no focal motor/sensory deficits  LABORATORY DATA:  I have reviewed the data as listed    Latest Ref Rng & Units 05/26/2022    2:39 PM 01/27/2022    8:11 AM 10/27/2021   10:27 AM  CBC  WBC 4.0 - 10.5 K/uL 4.8  3.2  4.6   Hemoglobin 12.0 - 15.0 g/dL 19.1  47.8  29.5   Hematocrit 36.0 - 46.0 % 34.4  37.8  38.9   Platelets 150 - 400 K/uL 84  64  65        Latest Ref Rng & Units 05/26/2022    2:39 PM 01/27/2022    8:11 AM 10/27/2021   10:27 AM  CMP  Glucose 70 - 99 mg/dL 83  63  621   BUN 6 - 20 mg/dL 13  8  8    Creatinine 0.44 - 1.00 mg/dL 3.08  6.57  8.46   Sodium 135 - 145 mmol/L 138  139  137   Potassium 3.5 - 5.1 mmol/L 3.8  4.0  3.8   Chloride 98 - 111 mmol/L 109  109  107   CO2 22 - 32 mmol/L 25  25  26    Calcium 8.9 - 10.3 mg/dL 8.5  8.5  8.4   Total Protein 6.5 - 8.1 g/dL 6.8  6.5  6.8   Total Bilirubin 0.3 - 1.2 mg/dL 0.3  0.5  0.7   Alkaline Phos 38 - 126 U/L 51  43  44   AST 15 - 41 U/L 17  13  18    ALT 0 - 44 U/L 21  12  24     ASSESSMENT & PLAN Sanantha Proffer is a 37 y.o. 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   #Iron deficiency anemia 2/2 heavy menstrual bleeding: --Received IV monoferric 1000 mg x 1 dose on 01/30/2021 --Continue to incorporate iron rich foods into diet. --Currently on birth control  and under the care of gynecologist --Labs today show white blood cell ***. Iron panel show no evidence of deficiency.  --Currently on ferrous sulfate 325 mg once daily. Recommend to continue with a source of vitamin C. --Need to premedicate for future IV iron infusions as patient had mild rash/pruritis with most recent infusion.   #Thrombocytopenia, likely ITP: --Patient received steroids during her pregnancy in 2020 with improvement of platelet counts. Otherwise, she has not received additional therapy --Patient denies signs of bleeding except for menstrual cycle.  --Workup from 01/20/2021 showed no evidence of additional deficiencies, hepatitis B or C.  Immature platelet fraction was elevated to suggests ITP as underlying etiology. --labs today show Plt *** --Continue to monitor with strict precautions for bleeding.  Follow up: -- Labs every 3 months with clinic visits every 6.  No orders of the defined types were placed in this encounter.   All questions were answered. The patient knows to call the clinic with any problems, questions or concerns.  I have spent a total of 30 minutes minutes of face-to-face and non-face-to-face time, preparing to see the patient, performing a medically appropriate examination, counseling and educating the patient, ordering tests, documenting clinical information in the electronic health record, and care coordination.   Ulysees Barns, MD Department of Hematology/Oncology [facility] Phone: [phone] Pager: [phone] Email: [email or web address]

## 2022-10-27 ENCOUNTER — Other Ambulatory Visit: Payer: Self-pay | Admitting: Obstetrics and Gynecology

## 2022-10-27 DIAGNOSIS — N611 Abscess of the breast and nipple: Secondary | ICD-10-CM

## 2022-10-29 ENCOUNTER — Ambulatory Visit
Admission: RE | Admit: 2022-10-29 | Discharge: 2022-10-29 | Disposition: A | Discharge status: Home / Self Care | Payer: BC Managed Care – PPO | Source: Ambulatory Visit | Attending: Obstetrics and Gynecology | Admitting: Obstetrics and Gynecology

## 2022-10-29 ENCOUNTER — Other Ambulatory Visit: Payer: Self-pay | Admitting: Obstetrics and Gynecology

## 2022-10-29 DIAGNOSIS — N611 Abscess of the breast and nipple: Secondary | ICD-10-CM

## 2022-11-30 ENCOUNTER — Inpatient Hospital Stay: Admission: RE | Admit: 2022-11-30 | Payer: BC Managed Care – PPO | Source: Ambulatory Visit

## 2022-12-15 ENCOUNTER — Encounter (HOSPITAL_BASED_OUTPATIENT_CLINIC_OR_DEPARTMENT_OTHER): Payer: Self-pay | Admitting: Emergency Medicine

## 2022-12-15 ENCOUNTER — Telehealth: Payer: Self-pay | Admitting: Hematology and Oncology

## 2022-12-15 ENCOUNTER — Other Ambulatory Visit: Payer: Self-pay

## 2022-12-15 ENCOUNTER — Inpatient Hospital Stay (HOSPITAL_BASED_OUTPATIENT_CLINIC_OR_DEPARTMENT_OTHER)
Admission: EM | Admit: 2022-12-15 | Discharge: 2022-12-19 | DRG: 813 | Disposition: A | Discharge status: Home / Self Care | Payer: BC Managed Care – PPO | Attending: Internal Medicine | Admitting: Internal Medicine

## 2022-12-15 ENCOUNTER — Observation Stay (HOSPITAL_COMMUNITY): Payer: BC Managed Care – PPO

## 2022-12-15 DIAGNOSIS — Z79890 Hormone replacement therapy (postmenopausal): Secondary | ICD-10-CM

## 2022-12-15 DIAGNOSIS — N920 Excessive and frequent menstruation with regular cycle: Secondary | ICD-10-CM | POA: Diagnosis not present

## 2022-12-15 DIAGNOSIS — F419 Anxiety disorder, unspecified: Secondary | ICD-10-CM | POA: Diagnosis present

## 2022-12-15 DIAGNOSIS — D62 Acute posthemorrhagic anemia: Secondary | ICD-10-CM | POA: Diagnosis not present

## 2022-12-15 DIAGNOSIS — R000 Tachycardia, unspecified: Secondary | ICD-10-CM | POA: Diagnosis present

## 2022-12-15 DIAGNOSIS — Z793 Long term (current) use of hormonal contraceptives: Secondary | ICD-10-CM

## 2022-12-15 DIAGNOSIS — D696 Thrombocytopenia, unspecified: Secondary | ICD-10-CM | POA: Diagnosis not present

## 2022-12-15 DIAGNOSIS — D649 Anemia, unspecified: Secondary | ICD-10-CM | POA: Diagnosis present

## 2022-12-15 DIAGNOSIS — R002 Palpitations: Secondary | ICD-10-CM | POA: Diagnosis present

## 2022-12-15 DIAGNOSIS — D693 Immune thrombocytopenic purpura: Principal | ICD-10-CM | POA: Diagnosis present

## 2022-12-15 DIAGNOSIS — N939 Abnormal uterine and vaginal bleeding, unspecified: Secondary | ICD-10-CM

## 2022-12-15 DIAGNOSIS — Z79899 Other long term (current) drug therapy: Secondary | ICD-10-CM

## 2022-12-15 LAB — COMPREHENSIVE METABOLIC PANEL
ALT: 12 U/L (ref 0–44)
AST: 15 U/L (ref 15–41)
Albumin: 3.1 g/dL — ABNORMAL LOW (ref 3.5–5.0)
Alkaline Phosphatase: 35 U/L — ABNORMAL LOW (ref 38–126)
Anion gap: 6 (ref 5–15)
BUN: 10 mg/dL (ref 6–20)
CO2: 20 mmol/L — ABNORMAL LOW (ref 22–32)
Calcium: 8.2 mg/dL — ABNORMAL LOW (ref 8.9–10.3)
Chloride: 108 mmol/L (ref 98–111)
Creatinine, Ser: 0.74 mg/dL (ref 0.44–1.00)
GFR, Estimated: >60 mL/min (ref >60)
Glucose, Bld: 91 mg/dL (ref 70–99)
Potassium: 3.6 mmol/L (ref 3.5–5.1)
Sodium: 134 mmol/L — ABNORMAL LOW (ref 135–145)
Total Bilirubin: 0.6 mg/dL (ref <1.2)
Total Protein: 5.1 g/dL — ABNORMAL LOW (ref 6.5–8.1)

## 2022-12-15 LAB — FERRITIN: Ferritin: 56 ng/mL (ref 11–307)

## 2022-12-15 LAB — CBC
HCT: 26.5 % — ABNORMAL LOW (ref 36.0–46.0)
Hemoglobin: 8.9 g/dL — ABNORMAL LOW (ref 12.0–15.0)
MCH: 31.4 pg (ref 26.0–34.0)
MCHC: 33.6 g/dL (ref 30.0–36.0)
MCV: 93.6 fL (ref 80.0–100.0)
Platelets: <5 10*3/uL — CL (ref 150–400)
RBC: 2.83 MIL/uL — ABNORMAL LOW (ref 3.87–5.11)
RDW: 12.5 % (ref 11.5–15.5)
WBC: 4.7 10*3/uL (ref 4.0–10.5)
nRBC: 0 % (ref 0.0–0.2)

## 2022-12-15 LAB — IRON AND TIBC
Iron: 85 ug/dL (ref 28–170)
Saturation Ratios: 38 % — ABNORMAL HIGH (ref 10.4–31.8)
TIBC: 227 ug/dL — ABNORMAL LOW (ref 250–450)
UIBC: 142 ug/dL

## 2022-12-15 LAB — IMMATURE PLATELET FRACTION: Immature Platelet Fraction: 26.2 % — ABNORMAL HIGH (ref 1.2–8.6)

## 2022-12-15 LAB — HIV ANTIBODY (ROUTINE TESTING W REFLEX): HIV Screen 4th Generation wRfx: NONREACTIVE

## 2022-12-15 LAB — PREGNANCY, URINE: Preg Test, Ur: NEGATIVE

## 2022-12-15 MED ORDER — ACETAMINOPHEN 325 MG PO TABS
650.0000 mg | ORAL_TABLET | Freq: Four times a day (QID) | ORAL | Status: DC | PRN
  Administered 2022-12-15 – 2022-12-16 (×2): 650 mg via ORAL
  Filled 2022-12-15 (×2): qty 2

## 2022-12-15 MED ORDER — FERROUS SULFATE 325 (65 FE) MG PO TABS
325.0000 mg | ORAL_TABLET | Freq: Every day | ORAL | Status: DC
  Administered 2022-12-16 – 2022-12-19 (×4): 325 mg via ORAL
  Filled 2022-12-15 (×4): qty 1

## 2022-12-15 MED ORDER — SODIUM CHLORIDE 0.9% IV SOLUTION: Freq: Once | INTRAVENOUS | Status: DC

## 2022-12-15 MED ORDER — ACETAMINOPHEN 325 MG PO TABS
650.0000 mg | ORAL_TABLET | Freq: Once | ORAL | Status: AC
  Administered 2022-12-15: 650 mg via ORAL
  Filled 2022-12-15: qty 2

## 2022-12-15 MED ORDER — DEXAMETHASONE 4 MG PO TABS
40.0000 mg | ORAL_TABLET | Freq: Every day | ORAL | Status: AC
  Administered 2022-12-15 – 2022-12-18 (×4): 40 mg via ORAL
  Filled 2022-12-15 (×5): qty 10

## 2022-12-15 MED ORDER — MEDROXYPROGESTERONE ACETATE 10 MG PO TABS
20.0000 mg | ORAL_TABLET | Freq: Once | ORAL | Status: AC
  Administered 2022-12-15: 20 mg via ORAL
  Filled 2022-12-15: qty 2

## 2022-12-15 MED ORDER — POLYETHYLENE GLYCOL 3350 17 G PO PACK
17.0000 g | PACK | Freq: Every day | ORAL | Status: DC | PRN
  Administered 2022-12-17: 17 g via ORAL
  Filled 2022-12-15: qty 1

## 2022-12-15 MED ORDER — NORETHINDRONE ACET-ETHINYL EST 1-20 MG-MCG PO TABS: 1.0000 | ORAL_TABLET | Freq: Every day | ORAL | Status: DC

## 2022-12-15 MED ORDER — ACETAMINOPHEN 650 MG RE SUPP: 650.0000 mg | Freq: Four times a day (QID) | RECTAL | Status: DC | PRN

## 2022-12-15 MED ORDER — TRANEXAMIC ACID 1000 MG/10ML IV SOLN
500.0000 mg | Freq: Once | INTRAVENOUS | Status: AC
  Administered 2022-12-15: 500 mg via TOPICAL
  Filled 2022-12-15: qty 10

## 2022-12-15 MED ORDER — LACTATED RINGERS IV BOLUS
1000.0000 mL | Freq: Once | INTRAVENOUS | Status: AC
  Administered 2022-12-15: 1000 mL via INTRAVENOUS

## 2022-12-15 MED ORDER — SODIUM CHLORIDE 0.9% FLUSH
3.0000 mL | Freq: Two times a day (BID) | INTRAVENOUS | Status: DC
  Administered 2022-12-16 – 2022-12-19 (×7): 3 mL via INTRAVENOUS

## 2022-12-15 NOTE — ED Triage Notes (Signed)
Pt caox4, ambulatory c/o heavy menstrual bleeding x11 days. PMH anemia with heavy menstrual cycles, but reports this one has been much heavier than usual and had a lot more clots than usual. Pt has to get blood transfusions approx 1x/yr. Pt states she was seen by OB/GYN yest and hgb was around 8. Pt c/o dizziness and weakness on exertion.

## 2022-12-15 NOTE — ED Notes (Signed)
Report given to Tommi Rumps RN at [facility] ED. Patient aware and consents to transfer.

## 2022-12-15 NOTE — ED Provider Notes (Signed)
[place] EMERGENCY DEPARTMENT AT [facility] Provider Note   CSN: [id number] Arrival date & time: 12/15/22  7846     History  Chief Complaint  Patient presents with   Vaginal Bleeding    Melinda [name] is a 37 y.o. female.  37 year old female here today for 11 days of vaginal bleeding.  Patient says that she frequently has heavy vaginal bleeding around her menstrual cycle, however this is worse than usual.  She saw her OB/GYN yesterday, who prescribed her medications which she did not pick up.  Today she has been feeling lightheaded.  She has a history of iron deficiency anemia, thrombocytopenia.   Vaginal Bleeding      Home Medications Prior to Admission medications   Medication Sig Start Date End Date Taking? Authorizing Provider  norethindrone-ethinyl estradiol (LOESTRIN) 1-20 MG-MCG tablet Take 1 tablet by mouth daily. 12/14/22  Yes [provider]  acetaminophen (TYLENOL) 325 MG tablet Take 2 tablets (650 mg total) by mouth every 4 (four) hours as needed for up to 30 doses for mild pain. 09/23/18   Nugent, Odie Sera, NP  escitalopram (LEXAPRO) 10 MG tablet Take 1 tablet (10 mg total) by mouth daily. 06/02/22   Nelwyn Salisbury, MD  ferrous sulfate 325 (65 FE) MG tablet Take 1 tablet (325 mg total) by mouth daily. 09/23/18 03/23/22  Nugent, Odie Sera, NP  Prenat-Fe Carbonyl-FA-Omega 3 (ONE-A-DAY WOMENS PRENATAL 1) 28-0.8-235 MG CAPS Take 1 capsule by mouth daily. 06/16/18   Raelyn Mora, CNM  valACYclovir (VALTREX) 500 MG tablet Take 1 tablet (500 mg total) by mouth 2 (two) times daily. 08/24/18   Raelyn Mora, CNM  Burr Medico 150-35 MCG/24HR transdermal patch 1 patch once a week.    [provider]      Allergies    Patient has no known allergies.    Review of Systems   Review of Systems  Genitourinary:  Positive for vaginal bleeding.    Physical Exam Updated Vital Signs BP 101/65   Pulse (!) 104   Temp 98.4 F (36.9 C) (Oral)   Resp 10   Ht  5\' 2"  (1.575 m)   Wt 65 kg   LMP 12/05/2022 (Exact Date)   SpO2 100%   BMI 26.21 kg/m  Physical Exam Vitals reviewed.  Pulmonary:     Effort: Pulmonary effort is normal.  Genitourinary:    Comments: Chaperone present.  Red and clotted blood in the vaginal vault.  No active pulsatile bleeding visualized.  Blood oozing from cervical os. Musculoskeletal:        General: Normal range of motion.  Neurological:     Mental Status: She is alert.     ED Results / Procedures / Treatments   Labs (all labs ordered are listed, but only abnormal results are displayed) Labs Reviewed  CBC - Abnormal; Notable for the following components:      Result Value   RBC 2.83 (*)    Hemoglobin 8.9 (*)    HCT 26.5 (*)    Platelets <5 (*)    All other components within normal limits  PREGNANCY, URINE  TYPE AND SCREEN    EKG None  Radiology No results found.  Procedures .Critical Care  Performed by: Arletha Pili, DO Authorized by: Arletha Pili, DO   Critical care provider statement:    Critical care time (minutes):  30   Critical care was necessary to treat or prevent imminent or life-threatening deterioration of the following conditions:  Shock  Critical care was time spent personally by me on the following activities:  Development of treatment plan with patient or surrogate, discussions with consultants, evaluation of patient's response to treatment, examination of patient, ordering and review of laboratory studies, ordering and review of radiographic studies, ordering and performing treatments and interventions, pulse oximetry, re-evaluation of patient's condition and review of old charts   Care discussed with: admitting provider       Medications Ordered in ED Medications  tranexamic acid (CYKLOKAPRON) injection 500 mg (500 mg Topical Given 12/15/22 0955)  medroxyPROGESTERone (PROVERA) tablet 20 mg (20 mg Oral Given 12/15/22 1034)  lactated ringers bolus 1,000 mL (1,000 mLs  Intravenous New Bag/Given 12/15/22 1035)    ED Course/ Medical Decision Making/ A&P                                 Medical Decision Making 37 year old female here today for vaginal bleeding.  Plan # on assessment, patient without brisk bleeding, but is having slow bleeding.  Her platelets are 5.  Hemoglobin not significantly decreased from yesterday.  Spoke with Dr. Elon Spanner of OB/GYN who agreed that the patient needed to be admitted, however was not appropriate for their service due to the thrombocytopenia but were happy to consult.  Did speak with hematology who agreed with following along with this patient.  Will transfer to [facility] as an ED to ED as I believe the patient requires platelets and a type and screen in a timely fashion then we would be able to achieve while awaiting for transport to a floor bed.  Amount and/or Complexity of Data Reviewed Labs: ordered.  Risk Prescription drug management.           Final Clinical Impression(s) / ED Diagnoses Final diagnoses:  Thrombocytopenia (HCC)  Vaginal bleeding    Rx / DC Orders ED Discharge Orders     None         Arletha Pili, DO 12/15/22 1054

## 2022-12-15 NOTE — ED Notes (Signed)
Called [facility] for transport Ed to Ed tx via [employer], Melene Plan, DO accepting physician

## 2022-12-15 NOTE — Consult Note (Signed)
[facility] CONSULT NOTE  Patient Care Team: Nelwyn Salisbury, MD as PCP - General Leonides Schanz Thereasa Distance, MD as Consulting Physician (Hematology and Oncology)   CHIEF COMPLAINTS/PURPOSE OF CONSULTATION:  Low platelets/Hx of ITP  HISTORY OF PRESENTING ILLNESS:  Melinda [name] 37 y.o. female very pleasant patient was brought to ED from [facility] due to very low platelets and heavy menstrual bleeding.  Patient c/o heavy menstrual bleeding with clots since 12/05/22. States she became so weak and fatigued that she could barely walk to the bathroom.     Work-up done included CBC and patient found to have extremely low platelet count 5k with Hgb 8.9 g/dl.  . Patient has been seen in the past by primary Heme/Dr. Leonides Schanz for thrombocytopenia.  Her last visit was 05/26/2022; she did not show for her last appt in 08/2022.   Medical history as stated is significant for low platelets and iron deficiency anemia.  Patient had steroids during pregnancy in 2020 with subsequent improvement in counts.  Work-up in 2023 showed no e/o hep B or C.  Heme consult now requested for further management.   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   -Recommend platelet transfusion, which is now transfusing well. -Transfuse platelets to keep >20k -Dexamethasone 40 mg po daily x4 to start today.   -If no improvement in counts, will consider IVIG in 48 hrs.   -patient previously given steroids in 2020 during pregnancy with improvement in platelets at that time. -monitor closely  2. Anemia -likely multifactorial due to iron deficiency, menorrhagia -Hgb 8.9 per cbc today. -Transfuse prbc to maintain hgb >7.  Recommend T&S; however no transfusion at this time indicated; repeat labs in am.  -iron panel with ferritin, immature platelets ordered and results are pending.  -recheck CBC in am and daily -patient previously on oral iron therapy. -follow up with primary Heme/Dr.  Leonides Schanz upon discharge  3. Menorrhagia -patient c/o heavy menstruation since 12/05/22 -follow with GYN upon discharge -monitor closely   Orders Placed This Encounter  Procedures   Critical Care    This order was created via procedure documentation    Standing Status:   Standing    Number of Occurrences:   1   Pregnancy, urine    If UA unable to be obtained, obtain hcg serum qualitative lab test    Standing Status:   Standing    Number of Occurrences:   1   CBC    Standing Status:   Standing    Number of Occurrences:   1   Comprehensive metabolic panel    Standing Status:   Standing    Number of Occurrences:   1   Ferritin    Standing Status:   Standing    Number of Occurrences:   1   Iron and TIBC    Standing Status:   Standing    Number of Occurrences:   1   Immature Platelet Fraction    Standing Status:   Standing    Number of Occurrences:   1   HIV Antibody (routine testing w rflx)    Standing Status:   Standing    Number of Occurrences:   1   Comprehensive metabolic panel    Standing Status:   Standing    Number of Occurrences:   1   CBC    Standing Status:   Standing    Number of Occurrences:   1  Diet regular Fluid consistency: Thin    Standing Status:   Standing    Number of Occurrences:   1    Order Specific Question:   Fluid consistency:    Answer:   Thin   Pelvic cart to bedside    Standing Status:   Standing    Number of Occurrences:   1   Pelvic cart    Standing Status:   Standing    Number of Occurrences:   1   Informed Consent Details: Physician/Practitioner Attestation; Transcribe to consent form and obtain patient signature    Standing Status:   Standing    Number of Occurrences:   1    Order Specific Question:   Physician/Practitioner attestation of informed consent for blood and or blood product transfusion    Answer:   I, the physician/practitioner, attest that I have discussed with the patient the benefits, risks, side effects,  alternatives, likelihood of achieving goals and potential problems during recovery for the procedure that I have provided informed consent.    Order Specific Question:   Product(s)    Answer:   All Product(s)   Vital signs    Standing Status:   Standing    Number of Occurrences:   1   Notify physician (specify)    Standing Status:   Standing    Number of Occurrences:   20    Order Specific Question:   Notify Physician    Answer:   for pulse less than 55 or greater than 120    Order Specific Question:   Notify Physician    Answer:   for respiratory rate less than 12 or greater than 25    Order Specific Question:   Notify Physician    Answer:   for temperature greater than 100.5 F    Order Specific Question:   Notify Physician    Answer:   for urinary output less than 30 mL/hr for four hours    Order Specific Question:   Notify Physician    Answer:   for systolic BP less than 90 or greater than 160, diastolic BP less than 60 or greater than 100    Order Specific Question:   Notify Physician    Answer:   for new hypoxia w/ oxygen saturations < 88%   Mobility Protocol: No Restrictions    RN to initiate protocols based on patient's level of care    Standing Status:   Standing    Number of Occurrences:   1   Refer to Sidebar Report Refer to ICU, Med-Surg, Progressive, and Step-Down Mobility Protocol Sidebars    Refer to ICU, Med-Surg, Progressive, and Step-Down Mobility Protocol Sidebars    Standing Status:   Standing    Number of Occurrences:   1   Do not place and if present remove PureWick    Standing Status:   Standing    Number of Occurrences:   1   Initiate Oral Care Protocol    Standing Status:   Standing    Number of Occurrences:   1   Initiate Carrier Fluid Protocol    Standing Status:   Standing    Number of Occurrences:   1   RN may order General Admission PRN Orders utilizing "General Admission PRN medications" (through manage orders) for the following patient needs:  allergy symptoms (Claritin), cold sores (Carmex), cough (Robitussin DM), eye irritation (Liquifilm Tears), hemorrhoids (Tucks), indigestion (Maalox), minor skin irritation (Hydrocortisone Cream), muscle pain (Ben Gay), nose  irritation (saline nasal spray) and sore throat (Chloraseptic spray).    Standing Status:   Standing    Number of Occurrences:   [phone]   SCDs    Standing Status:   Standing    Number of Occurrences:   1    Order Specific Question:   Laterality    Answer:   Bilateral   Cardiac Monitoring - Continuous Indefinite    Standing Status:   Standing    Number of Occurrences:   1    Order Specific Question:   Indications for use:    Answer:   ICU/Stepdown patient   Maintain IV access    Standing Status:   Standing    Number of Occurrences:   1   Full code    Standing Status:   Standing    Number of Occurrences:   1    Order Specific Question:   By:    Answer:   Consent: discussion documented in EHR   Consult to obstetrics / gynecology    Standing Status:   Standing    Number of Occurrences:   1    Order Specific Question:   Place call to:    Answer:   oncall OB/Gyn    Order Specific Question:   Reason for Consult    Answer:   Admit   Consult to hematology    Standing Status:   Standing    Number of Occurrences:   1    Order Specific Question:   Place call to:    Answer:   oncall Hematology    Order Specific Question:   Reason for Consult    Answer:   Admit   Consult to hospitalist    Standing Status:   Standing    Number of Occurrences:   1    Order Specific Question:   Place call to:    Answer:   [employer] Question:   Reason for Consult    Answer:   Admit   Oxygen therapy Mode or (Route): Nasal cannula; Liters Per Minute: 2; Keep O2 saturation between: greater than 92 %    Standing Status:   Standing    Number of Occurrences:   1    Order Specific Question:   Mode or (Route)    Answer:   Nasal cannula    Order Specific Question:    Liters Per Minute    Answer:   2    Order Specific Question:   Keep O2 saturation between    Answer:   greater than 92 %   EKG    Standing Status:   Standing    Number of Occurrences:   1   EKG    Standing Status:   Standing    Number of Occurrences:   1   Prepare platelet pheresis    Standing Status:   Standing    Number of Occurrences:   1    Order Specific Question:   Number of Apheresis Units (1 unit of apheresis platelets will increase platelets 30,000/mL in an avg sized adult)    Answer:   1 unit    Order Specific Question:   Transfusion Indications    Answer:   Bleeding due to thrombocytopenia or platelet fcn anormality    Order Specific Question:   Date/Time blood product needed    Answer:   For transfusion    Order Specific Question:   If emergent release call blood bank  Answer:   Not emergent release   Type and screen [facility]    [facility]     Standing Status:   Standing    Number of Occurrences:   1   Place in observation (patient's expected length of stay will be less than 2 midnights)    Standing Status:   Standing    Number of Occurrences:   1    Order Specific Question:   Hospital Area    Answer:   [facility] [[id number]]    Order Specific Question:   Level of Care    Answer:   Progressive [102]    Order Specific Question:   Admit to Progressive based on following criteria    Answer:   MULTISYSTEM THREATS such as stable sepsis, metabolic/electrolyte imbalance with or without encephalopathy that is responding to early treatment.    Order Specific Question:   Admit to Progressive based on following criteria    Answer:   Other see comments    Order Specific Question:   Comments    Answer:   High risk of bleeding, platelets <5    Order Specific Question:   May place patient in observation at [facility] or [facility] if equivalent level of care is available:    Answer:   No    Order Specific Question:   Covid  Evaluation    Answer:   Asymptomatic - no recent exposure (last 10 days) testing not required    Order Specific Question:   Diagnosis    Answer:   Thrombocytopenia [facility]) [[id number]]    Order Specific Question:   Admitting Physician    Answer:   Synetta Fail [[id number]]    Order Specific Question:   Attending Physician    Answer:   Synetta Fail [phone]     MEDICAL HISTORY:  Past Medical History:  Diagnosis Date   Anemia    Chickenpox    Early satiety    Herpes genitalia    HPV (human papilloma virus) anogenital infection    Iron deficiency anemia    Night sweat    Scarring, keloid    external genitalia   Thrombocytopenia (HCC)    Vaginal Pap smear, abnormal    Weight loss, non-intentional     SURGICAL HISTORY: Past Surgical History:  Procedure Laterality Date   CESAREAN SECTION N/A 09/20/2018   Procedure: CESAREAN SECTION;  Surgeon: Kathrynn Running, MD;  Location: [facility] LD ORS;  Service: Obstetrics;  Laterality: N/A;   WISDOM TOOTH EXTRACTION      SOCIAL HISTORY: Social History   Socioeconomic History   Marital status: Single    Spouse name: Not on file   Number of children: 0   Years of education: Not on file   Highest education level: Master's degree (e.g., MA, MS, MEng, MEd, MSW, MBA)  Occupational History    Employer: [employer]    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[employer]; Runner, broadcasting/film/video  Tobacco Use   Smoking status: Never   Smokeless tobacco: Never  Vaping Use   Vaping status: Never Used  Substance and Sexual Activity   Alcohol use: No    Alcohol/week: 0.0 standard drinks of alcohol   Drug use: No   Sexual activity: Not Currently    Birth control/protection: None  Other Topics Concern   Not on file  Social History Narrative   Not on file   Social Determinants of Health   Financial Resource Strain: Patient Declined (  03/10/2022)   Overall Financial Resource Strain (CARDIA)    Difficulty of Paying Living Expenses: Patient declined  Food  Insecurity: Unknown (03/10/2022)   Hunger Vital Sign    Worried About Running Out of Food in the Last Year: Patient declined    Ran Out of Food in the Last Year: Not on file  Transportation Needs: Patient Declined (03/10/2022)   PRAPARE - Transportation    Lack of Transportation (Medical): Patient declined    Lack of Transportation (Non-Medical): Patient declined  Physical Activity: Insufficiently Active (03/10/2022)   Exercise Vital Sign    Days of Exercise per Week: 1 day    Minutes of Exercise per Session: 30 min  Stress: Patient Declined (03/10/2022)   [employer] of Occupational Health - Occupational Stress Questionnaire    Feeling of Stress : Patient declined  Social Connections: Unknown (03/10/2022)   Social Connection and Isolation Panel [NHANES]    Frequency of Communication with Friends and Family: Patient declined    Frequency of Social Gatherings with Friends and Family: Patient declined    Attends Religious Services: Patient declined    Database administrator or Organizations: Patient declined    Attends Banker Meetings: Not on file    Marital Status: Patient declined  Intimate Partner Violence: Unknown (04/13/2021)   Received from [employer], [facility]   HITS    Physically Hurt: Not on file    Insult or Talk Down To: Not on file    Threaten Physical Harm: Not on file    Scream or Curse: Not on file    FAMILY HISTORY: Family History  Problem Relation Age of Onset   Hypertension Maternal Grandmother    Diabetes Maternal Grandmother    Hypertension Mother    Other Father        Leukopenia.   Hypertension Maternal Uncle     ALLERGIES:  has No Known Allergies.  MEDICATIONS:  Current Facility-Administered Medications  Medication Dose Route Frequency Provider Last Rate Last Admin   0.9 %  sodium chloride infusion (Manually program via Guardrails IV Fluids)   Intravenous Once Synetta Fail, MD       acetaminophen (TYLENOL) tablet 650 mg   650 mg Oral Q6H PRN Synetta Fail, MD       Or   acetaminophen (TYLENOL) suppository 650 mg  650 mg Rectal Q6H PRN Synetta Fail, MD       [START ON 12/16/2022] ferrous sulfate tablet 325 mg  325 mg Oral Daily Synetta Fail, MD       polyethylene glycol (MIRALAX / GLYCOLAX) packet 17 g  17 g Oral Daily PRN Synetta Fail, MD       sodium chloride flush (NS) 0.9 % injection 3 mL  3 mL Intravenous Q12H Synetta Fail, MD       Current Outpatient Medications  Medication Sig Dispense Refill   acetaminophen (TYLENOL) 325 MG tablet Take 2 tablets (650 mg total) by mouth every 4 (four) hours as needed for up to 30 doses for mild pain. 30 tablet 1   etonogestrel (NEXPLANON) 68 MG IMPL implant 68 mg by Subdermal route once.     ferrous sulfate 325 (65 FE) MG tablet Take 1 tablet (325 mg total) by mouth daily. 30 tablet 3   norethindrone-ethinyl estradiol (LOESTRIN) 1-20 MG-MCG tablet Take 1 tablet by mouth daily.     escitalopram (LEXAPRO) 10 MG tablet Take 1 tablet (10 mg total) by mouth daily. (Patient  not taking: Reported on 12/15/2022) 30 tablet 2   Prenat-Fe Carbonyl-FA-Omega 3 (ONE-A-DAY WOMENS PRENATAL 1) 28-0.8-235 MG CAPS Take 1 capsule by mouth daily. (Patient not taking: Reported on 12/15/2022) 30 capsule 0   valACYclovir (VALTREX) 500 MG tablet Take 1 tablet (500 mg total) by mouth 2 (two) times daily. (Patient not taking: Reported on 12/15/2022) 60 tablet 1    REVIEW OF SYSTEMS:   Constitutional: +fatigue. Denies fevers, chills or abnormal night sweats Eyes: Denies blurriness of vision, double vision or watery eyes Ears, nose, mouth, throat, and face: Denies mucositis or sore throat Respiratory: Denies cough, dyspnea or wheezes Cardiovascular: Denies palpitation, chest discomfort or lower extremity swelling Gastrointestinal: Denies nausea, heartburn or change in bowel habits Skin: Denies abnormal skin rashes Lymphatics: Denies new lymphadenopathy or easy  bruising Neurological: Denies numbness, tingling or new weaknesses Behavioral/Psych: Mood is stable, no new changes  Gyn: +heavy menstrual bleeding with clots All other systems were reviewed with the patient and are negative.  PHYSICAL EXAMINATION: ECOG PERFORMANCE STATUS: 1 - Symptomatic but completely ambulatory  Vitals:   12/15/22 1216 12/15/22 1330  BP: 113/61 (!) 108/53  Pulse: (!) 103 94  Resp: 18 18  Temp: 98.4 F (36.9 C) 98.9 F (37.2 C)  SpO2: 99%    Filed Weights   12/15/22 0942  Weight: 143 lb 4.8 oz (65 kg)    GENERAL: alert, no distress and comfortable SKIN: skin color, texture, turgor are normal, no rashes or significant lesions EYES: normal, conjunctiva are pink and non-injected, sclera clear OROPHARYNX: no exudate, no erythema and lips, buccal mucosa, and tongue normal  NECK: supple, thyroid normal size, non-tender, without nodularity LYMPH: no palpable lymphadenopathy in the cervical, axillary or inguinal LUNGS: clear to auscultation and percussion with normal breathing effort HEART: regular rate & rhythm and no murmurs and no lower extremity edema ABDOMEN: abdomen soft, non-tender and normal bowel sounds MUSCULOSKELETAL: no cyanosis of digits and no clubbing  PSYCH: alert & oriented x 3 with fluent speech NEURO: no focal motor/sensory deficits  The total time spent in the appointment was 40 minutes encounter with patients including review of chart and various tests results, discussions about plan of care and coordination of care plan   All questions were answered. The patient knows to call the clinic with any problems, questions or concerns. No barriers to learning was detected.  Dietrich Pates Kaitland Lewellyn, NP 12/4/20241:50 PM   LABORATORY DATA:  I have reviewed the data as listed Lab Results  Component Value Date   WBC 4.7 12/15/2022   HGB 8.9 (L) 12/15/2022   HCT 26.5 (L) 12/15/2022   MCV 93.6 12/15/2022   PLT <5 (LL) 12/15/2022    RADIOGRAPHIC  STUDIES: I have personally reviewed the radiological images as listed and agreed with the findings in the report. No results found.

## 2022-12-15 NOTE — ED Notes (Signed)
Contacted MD on call for [facility], forwarded the call to Anders Simmonds, DO.

## 2022-12-15 NOTE — H&P (Signed)
History and Physical   Melinda [name] ZOX:[id number] DOB: 1985/04/25 DOA: 12/15/2022  PCP: Nelwyn Salisbury, MD   Patient coming from: Home  Chief Complaint: Vaginal bleeding  HPI: Melinda [name] is a 37 y.o. 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  Patient reports 11 days of increased menstrual bleeding.  She has a history of heavy menstrual bleeding but has been significantly increased from previous and has noticed more clots.  Also noted to be lightheaded today.  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  Denies fevers, chills, chest pain, shortness breath, abdominal pain, constipation, diarrhea, nausea, vomiting.  Vital signs in the ED notable for heart rate in the 100s to 130s with improvement to the 100s  ED Course: At last check.  Blood pressure in the 100s to 140 systolic, respirate in the teens to 20s.  Lab workup included CBC with hemoglobin 8.9, platelets less than 5.  Patient received Tylenol, Provera, tranexamic acid, 1 L IV fluids, 1 unit of platelets.  [facility] ED to expedite platelet transfusion.  OB/GYN consulted but given thrombocytopenia stated that this is likely primarily a hematologic issue, I confirmed with them that they do not need to follow.  Hematology consulted and agreed with platelet transfusion and would forward patient information to the patient's hematologist with plan for inpatient consult.  Review of Systems: As per HPI otherwise all other systems reviewed and are negative.  Past Medical History:  Diagnosis Date   Anemia    Chickenpox    Early satiety    Herpes genitalia    HPV (human papilloma virus) anogenital infection    Iron deficiency anemia    Night sweat    Scarring, keloid    external genitalia    Thrombocytopenia (HCC)    Vaginal Pap smear, abnormal    Weight loss, non-intentional     Past Surgical History:  Procedure Laterality Date   CESAREAN SECTION N/A 09/20/2018   Procedure: CESAREAN SECTION;  Surgeon: Kathrynn Running, MD;  Location: [facility] LD ORS;  Service: Obstetrics;  Laterality: N/A;   WISDOM TOOTH EXTRACTION      Social History  reports that she has never smoked. She has never used smokeless tobacco. She reports that she does not drink alcohol and does not use drugs.  No Known Allergies  Family History  Problem Relation Age of Onset   Hypertension Maternal Grandmother    Diabetes Maternal Grandmother    Hypertension Mother    Other Father        Leukopenia.   Hypertension Maternal Uncle   Reviewed on admission  Prior to Admission medications   Medication Sig Start Date End Date Taking? Authorizing Provider  acetaminophen (TYLENOL) 325 MG tablet Take 2 tablets (650 mg total) by mouth every 4 (four) hours as needed for up to 30 doses for mild pain. 09/23/18  Yes Nugent, Odie Sera, NP  etonogestrel (NEXPLANON) 68 MG IMPL implant 68 mg by Subdermal route once. 04/27/22  Yes [provider]  ferrous sulfate 325 (65 FE) MG tablet Take 1 tablet (325 mg total) by mouth daily. 09/23/18 12/15/22 Yes Nugent, Odie Sera, NP  norethindrone-ethinyl estradiol (LOESTRIN) 1-20 MG-MCG tablet Take 1 tablet by mouth daily. 12/14/22  Yes [provider]  escitalopram (LEXAPRO) 10 MG tablet Take 1 tablet (10  mg total) by mouth daily. Patient not taking: Reported on 12/15/2022 06/02/22   Nelwyn Salisbury, MD  Prenat-Fe Carbonyl-FA-Omega 3 (ONE-A-DAY WOMENS PRENATAL 1) 28-0.8-235 MG CAPS Take 1 capsule by mouth daily. Patient not taking: Reported on 12/15/2022 06/16/18   Raelyn Mora, CNM  valACYclovir (VALTREX) 500 MG tablet Take 1 tablet (500 mg total) by mouth 2 (two) times daily. Patient not taking: Reported on 12/15/2022 08/24/18   Raelyn Mora, CNM    Physical  Exam: Vitals:   12/15/22 1030 12/15/22 1216 12/15/22 1330 12/15/22 1345  BP: 101/65 113/61 (!) 108/53 106/73  Pulse: (!) 104 (!) 103 94 (!) 107  Resp: 10 18 18 18   Temp:  98.4 F (36.9 C) 98.9 F (37.2 C) 98.8 F (37.1 C)  TempSrc:   Oral   SpO2: 100% 99%  100%  Weight:      Height:        Physical Exam Constitutional:      General: She is not in acute distress.    Appearance: Normal appearance.  HENT:     Head: Normocephalic and atraumatic.     Mouth/Throat:     Mouth: Mucous membranes are moist.     Pharynx: Oropharynx is clear.  Eyes:     Extraocular Movements: Extraocular movements intact.     Pupils: Pupils are equal, round, and reactive to light.  Cardiovascular:     Rate and Rhythm: Normal rate and regular rhythm.     Pulses: Normal pulses.     Heart sounds: Normal heart sounds.  Pulmonary:     Effort: Pulmonary effort is normal. No respiratory distress.     Breath sounds: Normal breath sounds.  Abdominal:     General: Bowel sounds are normal. There is no distension.     Palpations: Abdomen is soft.     Tenderness: There is no abdominal tenderness.  Musculoskeletal:        General: No swelling or deformity.  Skin:    General: Skin is warm and dry.  Neurological:     General: No focal deficit present.     Mental Status: Mental status is at baseline.     Labs on Admission: I have personally reviewed following labs and imaging studies  CBC: Recent Labs  Lab 12/15/22 0936  WBC 4.7  HGB 8.9*  HCT 26.5*  MCV 93.6  PLT <5*    Basic Metabolic Panel: Recent Labs  Lab 12/15/22 1309  NA 134*  K 3.6  CL 108  CO2 20*  GLUCOSE 91  BUN 10  CREATININE 0.74  CALCIUM 8.2*    GFR: Estimated Creatinine Clearance: 86.1 mL/min (by C-G formula based on SCr of 0.74 mg/dL).  Liver Function Tests: Recent Labs  Lab 12/15/22 1309  AST 15  ALT 12  ALKPHOS 35*  BILITOT 0.6  PROT 5.1*  ALBUMIN 3.1*    Urine analysis:    Component Value Date/Time    COLORURINE YELLOW 05/25/2018 0912   APPEARANCEUR CLOUDY (A) 05/25/2018 0912   LABSPEC 1.013 05/25/2018 0912   PHURINE 8.0 05/25/2018 0912   GLUCOSEU NEGATIVE 05/25/2018 0912   HGBUR NEGATIVE 05/25/2018 0912   BILIRUBINUR neg 03/23/2022 1645   KETONESUR NEGATIVE 05/25/2018 0912   PROTEINUR Negative 03/23/2022 1645   PROTEINUR NEGATIVE 05/25/2018 0912   UROBILINOGEN 0.2 03/23/2022 1645   NITRITE neg 03/23/2022 1645   NITRITE NEGATIVE 05/25/2018 0912   LEUKOCYTESUR Negative 03/23/2022 1645   LEUKOCYTESUR NEGATIVE 05/25/2018 0912    Radiological Exams on Admission: No  results found.  EKG: Not performed in emergency department  Assessment/Plan Principal Problem:   Thrombocytopenia (HCC) Active Problems:   Anemia   Thrombocytopenia Anemia Menorrhagia > Patient with known history of anemia and thrombocytopenia presenting with increased heavy vaginal bleeding with clots. 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 > Platelets noted to be less than 5 in the ED with hemoglobin 8.9. > Case discussed with OB/GYN, they are not following the recommended addition of Loestrin as planned outpatient and to reconsult if profound vaginal bleeding (needing a new pad after 30 minutes or large, lemon sized, clots).  Case discussed with hematology who will forward information to patient's hematology team with plan for inpatient consult and agreed with platelet transfusions.  No comment on steroids. > 1 unit of platelets ordered for transfusion in the ED. - Monitor on progressive unit overnight - Appreciate hematology recommendations - Closely trend CBC - CT head for any focal neurologic symptoms - Will avoid any antiplatelet or anticoagulant medications - Anticipate steroids, but will wait for hematology recommendations - Has Nexplanon in place, start Loestrin - Follow-up ferritin, iron, immature platelet fraction  DVT prophylaxis: SCDs Code  Status:   Full Family Communication:  None on admission  Disposition Plan:   Patient is from:  Home  Anticipated DC to:  Home  Anticipated DC date:  1 to 5 days  Anticipated DC barriers: None  Consults called:  Hematology, possibly OB/GYN Admission status:  Observation, progressive  Severity of Illness: The appropriate patient status for this patient is OBSERVATION. Observation status is judged to be reasonable and necessary in order to provide the required intensity of service to ensure the patient's safety. The patient's presenting symptoms, physical exam findings, and initial radiographic and laboratory data in the context of their medical condition is felt to place them at decreased risk for further clinical deterioration. Furthermore, it is anticipated that the patient will be medically stable for discharge from the hospital within 2 midnights of admission.    Synetta Fail MD [facility]  How to contact the [facility] Attending or Consulting provider 7A - 7P or covering provider during after hours 7P -7A, for this patient?   Check the care team in [facility] and look for a) attending/consulting TR[facility] provider listed and b) the [facility] team listed Log into [email or web address] and use [place]'s universal password to access. If you do not have the password, please contact the hospital operator. Locate the [facility] provider you are looking for under [facility] and page to a number that you can be directly reached. If you still have difficulty reaching the provider, please page the [facility] (Director on Call) for the Hospitalists listed on amion for assistance.  12/15/2022, 2:35 PM

## 2022-12-15 NOTE — ED Notes (Signed)
Pharmacy sent a secure chat and said that the patient would have to bring her loestrin from home.

## 2022-12-15 NOTE — ED Notes (Signed)
Pt care taken, no complaints at this time. 

## 2022-12-16 ENCOUNTER — Encounter (HOSPITAL_COMMUNITY): Payer: Self-pay | Admitting: Internal Medicine

## 2022-12-16 ENCOUNTER — Other Ambulatory Visit: Payer: Self-pay

## 2022-12-16 DIAGNOSIS — N921 Excessive and frequent menstruation with irregular cycle: Secondary | ICD-10-CM

## 2022-12-16 DIAGNOSIS — D649 Anemia, unspecified: Secondary | ICD-10-CM | POA: Diagnosis not present

## 2022-12-16 DIAGNOSIS — D62 Acute posthemorrhagic anemia: Secondary | ICD-10-CM | POA: Diagnosis present

## 2022-12-16 DIAGNOSIS — Z79890 Hormone replacement therapy (postmenopausal): Secondary | ICD-10-CM | POA: Diagnosis not present

## 2022-12-16 DIAGNOSIS — F419 Anxiety disorder, unspecified: Secondary | ICD-10-CM

## 2022-12-16 DIAGNOSIS — Z79899 Other long term (current) drug therapy: Secondary | ICD-10-CM | POA: Diagnosis not present

## 2022-12-16 DIAGNOSIS — Z793 Long term (current) use of hormonal contraceptives: Secondary | ICD-10-CM | POA: Diagnosis not present

## 2022-12-16 DIAGNOSIS — D696 Thrombocytopenia, unspecified: Secondary | ICD-10-CM | POA: Diagnosis not present

## 2022-12-16 DIAGNOSIS — N939 Abnormal uterine and vaginal bleeding, unspecified: Secondary | ICD-10-CM | POA: Diagnosis present

## 2022-12-16 DIAGNOSIS — I509 Heart failure, unspecified: Secondary | ICD-10-CM | POA: Diagnosis not present

## 2022-12-16 DIAGNOSIS — D693 Immune thrombocytopenic purpura: Secondary | ICD-10-CM | POA: Diagnosis present

## 2022-12-16 DIAGNOSIS — N920 Excessive and frequent menstruation with regular cycle: Secondary | ICD-10-CM | POA: Diagnosis present

## 2022-12-16 LAB — COMPREHENSIVE METABOLIC PANEL
ALT: 15 U/L (ref 0–44)
AST: 14 U/L — ABNORMAL LOW (ref 15–41)
Albumin: 3.2 g/dL — ABNORMAL LOW (ref 3.5–5.0)
Alkaline Phosphatase: 35 U/L — ABNORMAL LOW (ref 38–126)
Anion gap: 7 (ref 5–15)
BUN: 12 mg/dL (ref 6–20)
CO2: 21 mmol/L — ABNORMAL LOW (ref 22–32)
Calcium: 8.7 mg/dL — ABNORMAL LOW (ref 8.9–10.3)
Chloride: 109 mmol/L (ref 98–111)
Creatinine, Ser: 0.84 mg/dL (ref 0.44–1.00)
GFR, Estimated: >60 mL/min (ref >60)
Glucose, Bld: 131 mg/dL — ABNORMAL HIGH (ref 70–99)
Potassium: 3.8 mmol/L (ref 3.5–5.1)
Sodium: 137 mmol/L (ref 135–145)
Total Bilirubin: 0.5 mg/dL (ref <1.2)
Total Protein: 5.5 g/dL — ABNORMAL LOW (ref 6.5–8.1)

## 2022-12-16 LAB — PREPARE PLATELET PHERESIS: Unit division: 0

## 2022-12-16 LAB — BPAM PLATELET PHERESIS
Blood Product Expiration Date: 202412052359
ISSUE DATE / TIME: 202412041319
Unit Type and Rh: 6200

## 2022-12-16 LAB — CBC
HCT: 22.3 % — ABNORMAL LOW (ref 36.0–46.0)
Hemoglobin: 7.3 g/dL — ABNORMAL LOW (ref 12.0–15.0)
MCH: 30.9 pg (ref 26.0–34.0)
MCHC: 32.7 g/dL (ref 30.0–36.0)
MCV: 94.5 fL (ref 80.0–100.0)
Platelets: 87 10*3/uL — ABNORMAL LOW (ref 150–400)
RBC: 2.36 MIL/uL — ABNORMAL LOW (ref 3.87–5.11)
RDW: 13 % (ref 11.5–15.5)
WBC: 5.2 10*3/uL (ref 4.0–10.5)
nRBC: 0 % (ref 0.0–0.2)

## 2022-12-16 MED ORDER — ONDANSETRON HCL 4 MG/2ML IJ SOLN: 4.0000 mg | Freq: Four times a day (QID) | INTRAMUSCULAR | Status: DC | PRN

## 2022-12-16 MED ORDER — MELATONIN 5 MG PO TABS
10.0000 mg | ORAL_TABLET | Freq: Every evening | ORAL | Status: DC | PRN
  Administered 2022-12-18: 10 mg via ORAL
  Filled 2022-12-16: qty 2

## 2022-12-16 MED ORDER — NORETHINDRONE-ETH ESTRADIOL 0.4-35 MG-MCG PO TABS
1.0000 | ORAL_TABLET | Freq: Every day | ORAL | Status: DC
  Administered 2022-12-16: 1 via ORAL
  Filled 2022-12-16: qty 28
  Filled 2022-12-16 (×3): qty 1

## 2022-12-16 MED ORDER — HYDROXYZINE HCL 25 MG PO TABS
25.0000 mg | ORAL_TABLET | Freq: Three times a day (TID) | ORAL | Status: DC | PRN
  Administered 2022-12-16 – 2022-12-17 (×2): 25 mg via ORAL
  Filled 2022-12-16 (×3): qty 1

## 2022-12-16 NOTE — Subjective & Objective (Signed)
Pt seen and examined. Menses has slowed some since platelet transfusion.  Pt feels anxious. Wants to talk to dietician about better eating habits.  Tolerating decadron.

## 2022-12-16 NOTE — Care Management (Signed)
  Transition of Care [facility]) Screening Note   Patient Details  Name: Melinda [name] Date of Birth: 22-Aug-1985   Transition of Care [facility]) CM/SW Contact:    Lockie Pares, RN Phone Number: 12/16/2022, 12:03 PM    Transition of Care Department [facility]) has reviewed patient and no TOC needs have been identified at this time. We will continue to monitor patient advancement through interdisciplinary progression rounds. If new patient transition needs arise, please place a TOC consult.

## 2022-12-16 NOTE — Progress Notes (Signed)
Melinda [name]   DOB:1985/05/02   UU#:[id number]      ASSESSMENT & PLAN:   Thrombocytopenia/ ITP flare -pt with platelets 5k; s/p transfusion 12/15/22. -platelets now 87k today   -Repeat CBC with differential in am -transfuse for platelets <20k  -Dexamethasone 40 mg po daily x4, day 2 today 12/5    -consider IVIG if counts do not improve however does not seem she will need at this point.    -CT head done 12/4 and reviewed: negative  -monitor closely   2. Anemia, worsening -likely multifactorial due to iron deficiency, menorrhagia -HGB declining 8.9-->7.3 today -Transfuse prbc to maintain hgb >7.   -Iron panel done.  No iron infusion at this time.   -recommend recheck cbc with diff in am -patient previously on oral iron therapy. -follow up with primary Heme/Dr. Leonides Schanz upon discharge   3. Menorrhagia, ongoing -patient c/o heavy menstruation since 12/05/22 although reports she notices it is slowing down and has not changed menstrual pad for 6 hours.  -follow with GYN upon discharge -monitor closely  Code Status Full   Discharge planning: Discharge to home when medically optimized.  Subjective:  Patient seen awake alert and oriented x 3 laying supine in bed.  Reports that she is feeling anxious although states her bleeding is significantly less, she has not changed menstrual pad x6 hours.  Denies shortness of breath or dizziness.  Objective:  Vitals:   12/16/22 0830 12/16/22 0855  BP:  117/61  Pulse: (!) 120 (!) 128  Resp: 18 16  Temp:    SpO2: 100% 100%    No intake or output data in the 24 hours ending 12/16/22 0903   REVIEW OF SYSTEMS:   Constitutional: Denies fevers, chills or abnormal night sweats Eyes: Denies blurriness of vision, double vision or watery eyes Ears, nose, mouth, throat, and face: Denies mucositis or sore throat Respiratory: Denies cough, dyspnea or wheezes Cardiovascular: Denies palpitation, chest discomfort or lower extremity  swelling Gastrointestinal:  Denies nausea, heartburn or change in bowel habits Skin: Denies abnormal skin rashes Lymphatics: Denies new lymphadenopathy or easy bruising Neurological: Denies numbness, tingling or new weaknesses Behavioral/Psych: +anxious, Mood is stable, no new changes  All other systems were reviewed with the patient and are negative.  PHYSICAL EXAMINATION: ECOG PERFORMANCE STATUS: 1 - Symptomatic but completely ambulatory  Vitals:   12/16/22 0830 12/16/22 0855  BP:  117/61  Pulse: (!) 120 (!) 128  Resp: 18 16  Temp:    SpO2: 100% 100%   Filed Weights   12/15/22 0942  Weight: 143 lb 4.8 oz (65 kg)    GENERAL: alert, no distress and comfortable SKIN: skin color, texture, turgor are normal, no rashes or significant lesions EYES: normal, conjunctiva are pink and non-injected, sclera clear OROPHARYNX: no exudate, no erythema and lips, buccal mucosa, and tongue normal  NECK: supple, thyroid normal size, non-tender, without nodularity LYMPH: no palpable lymphadenopathy in the cervical, axillary or inguinal LUNGS: clear to auscultation and percussion with normal breathing effort HEART: regular rate & rhythm and no murmurs and no lower extremity edema ABDOMEN: abdomen soft, non-tender and normal bowel sounds MUSCULOSKELETAL: no cyanosis of digits and no clubbing  PSYCH: alert & oriented x 3 with fluent speech NEURO: no focal motor/sensory deficits   All questions were answered. The patient knows to call the clinic with any problems, questions or concerns.   The total time spent in the appointment was 25 minutes encounter with patient including review of chart and various  tests results, discussions about plan of care and coordination of care plan  Dawson Bills, NP 12/16/2022 9:03 AM    Labs Reviewed:  Lab Results  Component Value Date   WBC 5.2 12/16/2022   HGB 7.3 (L) 12/16/2022   HCT 22.3 (L) 12/16/2022   MCV 94.5 12/16/2022   PLT 87 (L) 12/16/2022    Recent Labs    05/26/22 1439 12/15/22 1309 12/16/22 0338  NA 138 134* 137  K 3.8 3.6 3.8  CL 109 108 109  CO2 25 20* 21*  GLUCOSE 83 91 131*  BUN 13 10 12   CREATININE 0.86 0.74 0.84  CALCIUM 8.5* 8.2* 8.7*  GFRNONAA >60 >60 >60  PROT 6.8 5.1* 5.5*  ALBUMIN 4.1 3.1* 3.2*  AST 17 15 14*  ALT 21 12 15   ALKPHOS 51 35* 35*  BILITOT 0.3 0.6 0.5    Studies Reviewed:  CT HEAD WO CONTRAST ( )  Result Date: 12/15/2022 CLINICAL DATA:  Severe thrombocytopenia with headache, concern for intracranial bleeding EXAM: CT HEAD WITHOUT CONTRAST TECHNIQUE: Contiguous axial images were obtained from the base of the skull through the vertex without intravenous contrast. RADIATION DOSE REDUCTION: This exam was performed according to the departmental dose-optimization program which includes automated exposure control, adjustment of the mA and/or kV according to patient size and/or use of iterative reconstruction technique. COMPARISON:  None Available. FINDINGS: Brain: No evidence of acute infarction, hemorrhage, mass, mass effect, or midline shift. No hydrocephalus or extra-axial fluid collection. 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 Vascular: No hyperdense vessel. Skull: Negative for fracture or focal lesion. Sinuses/Orbits: Mucosal thickening in the right sphenoid sinus. Otherwise clear paranasal sinuses. No acute finding in the orbits. Other: The mastoid air cells are well aerated. IMPRESSION: 1. No acute intracranial process. 2. Dysgenesis of the corpus callosum. Electronically Signed   By: Wiliam Ke M.D.   On: 12/15/2022 19:58

## 2022-12-16 NOTE — Assessment & Plan Note (Addendum)
12-16-2022 pt started on oral iron by hematology  Iron/TIBC/Ferritin/ %Sat    Component Value Date/Time   IRON 85 12/15/2022 1309   IRON 85 07/17/2012 1530   TIBC 227 (L) 12/15/2022 1309   TIBC 445 (H) 07/17/2012 1530   FERRITIN 56 12/15/2022 1309   FERRITIN 67 03/22/2013 1410   IRONPCTSAT 38 (H) 12/15/2022 1309   IRONPCTSAT 19 (L) 07/17/2012 1530

## 2022-12-16 NOTE — ED Notes (Signed)
ED TO INPATIENT HANDOFF REPORT  ED Nurse Name and Phone #: Huntley Dec [phone]  S Name/Age/Gender Particia Jasper 37 y.o. female Room/Bed: 006C/006C  Code Status   Code Status: Full Code  Home/SNF/Other Home Patient oriented to: self, place, time, and situation Is this baseline? Yes   Triage Complete: Triage complete  Chief Complaint Thrombocytopenia (HCC) [D69.6]  Triage Note Pt caox4, ambulatory c/o heavy menstrual bleeding x11 days. PMH anemia with heavy menstrual cycles, but reports this one has been much heavier than usual and had a lot more clots than usual. Pt has to get blood transfusions approx 1x/yr. Pt states she was seen by OB/GYN yest and hgb was around 8. Pt c/o dizziness and weakness on exertion.   Pt caox4, ambulatory c/o heavy menstrual bleeding x11 days. PMH anemia with heavy menstrual cycles, but reports this one has been much heavier than usual and had a lot more clots than usual. Pt has to get blood transfusions approx 1x/yr. Pt states she was seen by OB/GYN yest and hgb was around 8. Pt c/o dizziness and weakness on exertion    Allergies No Known Allergies  Level of Care/Admitting Diagnosis ED Disposition     ED Disposition  Admit   Condition  --   Comment  Hospital Area: [facility] [[id number]]  Level of Care: Progressive [102]  Admit to Progressive based on following criteria: MULTISYSTEM THREATS such as stable sepsis, metabolic/electrolyte imbalance with or without encephalopathy that is responding to early treatment.  Admit to Progressive based on following criteria: Other see comments  Comments: High risk of bleeding, platelets <5  May place patient in observation at [facility] or [facility] if equivalent level of care is available:: No  Covid Evaluation: Asymptomatic - no recent exposure (last 10 days) testing not required  Diagnosis: Thrombocytopenia [facility]) [[id number]]  Admitting Physician: Synetta Fail [[id number]]  Attending  Physician: Synetta Fail [phone]          B Medical/Surgery History Past Medical History:  Diagnosis Date   Anemia    Chickenpox    CHICKENPOX, HX OF 04/10/2007   Qualifier: Diagnosis of   By: Linna Darner, CMA, Cindy      Replacing diagnoses that were inactivated after the 04/12/22 regulatory import     Early satiety    Herpes genitalia    Herpes simplex infection in mother during third trimester of pregnancy 08/31/2018   Taking Valtrex 500 mg BID (started @ 36 wks)     HPV (human papilloma virus) anogenital infection    Iron deficiency anemia    Night sweat    Scarring, keloid    external genitalia   Thrombocytopenia (HCC)    Thrombocytopenia affecting pregnancy (HCC) 06/17/2018   Dx'd 2015 by hematologist; plan has included following/obs if remains above 50     Vaginal Pap smear, abnormal    Weight loss, non-intentional    Past Surgical History:  Procedure Laterality Date   CESAREAN SECTION N/A 09/20/2018   Procedure: CESAREAN SECTION;  Surgeon: Kathrynn Running, MD;  Location: [facility] LD ORS;  Service: Obstetrics;  Laterality: N/A;   WISDOM TOOTH EXTRACTION       A IV Location/Drains/Wounds Patient Lines/Drains/Airways Status     Active Line/Drains/Airways     Name Placement date Placement time Site Days   Peripheral IV 12/15/22 20 G 1" Right Antecubital 12/15/22  0941  Antecubital  1            Intake/Output Last 24 hours No  intake or output data in the 24 hours ending 12/16/22 9147  Labs/Imaging Results for orders placed or performed during the hospital encounter of 12/15/22 (from the past 48 hour(s))  Pregnancy, urine     Status: None   Collection Time: 12/15/22  9:36 AM  Result Value Ref Range   Preg Test, Ur NEGATIVE NEGATIVE    Comment:        THE SENSITIVITY OF THIS METHODOLOGY IS >25 mIU/mL. Performed at [facility], [address], [place], [place] [address]   CBC     Status: Abnormal   Collection Time: 12/15/22  9:36 AM   Result Value Ref Range   WBC 4.7 4.0 - 10.5 K/uL   RBC 2.83 (L) 3.87 - 5.11 MIL/uL   Hemoglobin 8.9 (L) 12.0 - 15.0 g/dL   HCT 21.3 (L) 08.6 - 57.8 %   MCV 93.6 80.0 - 100.0 fL   MCH 31.4 26.0 - 34.0 pg   MCHC 33.6 30.0 - 36.0 g/dL   RDW 46.9 62.9 - 52.8 %   Platelets <5 (LL) 150 - 400 K/uL    Comment: SPECIMEN CHECKED FOR CLOTS REPEATED TO VERIFY PLATELET COUNT CONFIRMED BY SMEAR THIS CRITICAL RESULT HAS VERIFIED AND BEEN CALLED TO J PEGRAM, RN BY DENNIS BRADLEY ON 12 04 2024 AT 1009, AND HAS BEEN READ BACK.     nRBC 0.0 0.0 - 0.2 %    Comment: Performed at [facility], [address], [place], [place] [address]  Prepare platelet pheresis     Status: None (Preliminary result)   Collection Time: 12/15/22  1:00 PM  Result Value Ref Range   Unit Number [id number]    Blood Component Type PLTP2 PSORALEN TREATED    Unit division 00    Status of Unit ISSUED    Transfusion Status      OK TO TRANSFUSE Performed at [facility], [address] [place], [place] [address]   Comprehensive metabolic panel     Status: Abnormal   Collection Time: 12/15/22  1:09 PM  Result Value Ref Range   Sodium 134 (L) 135 - 145 mmol/L   Potassium 3.6 3.5 - 5.1 mmol/L   Chloride 108 98 - 111 mmol/L   CO2 20 (L) 22 - 32 mmol/L   Glucose, Bld 91 70 - 99 mg/dL    Comment: Glucose reference range applies only to samples taken after fasting for at least 8 hours.   BUN 10 6 - 20 mg/dL   Creatinine, Ser 2.59 0.44 - 1.00 mg/dL   Calcium 8.2 (L) 8.9 - 10.3 mg/dL   Total Protein 5.1 (L) 6.5 - 8.1 g/dL   Albumin 3.1 (L) 3.5 - 5.0 g/dL   AST 15 15 - 41 U/L   ALT 12 0 - 44 U/L   Alkaline Phosphatase 35 (L) 38 - 126 U/L   Total Bilirubin 0.6 <1.2 mg/dL   GFR, Estimated >56 >38 mL/min    Comment: (NOTE) Calculated using the CKD-EPI Creatinine Equation (2021)    Anion gap 6 5 - 15    Comment: Performed at [facility], [address], [place], [place] [address]  Ferritin      Status: None   Collection Time: 12/15/22  1:09 PM  Result Value Ref Range   Ferritin 56 11 - 307 ng/mL    Comment: Performed at [facility], [address], [place], [place] [address]  Iron and TIBC     Status:  Abnormal   Collection Time: 12/15/22  1:09 PM  Result Value Ref Range   Iron 85 28 - 170 ug/dL   TIBC 161 (L) 096 - 045 ug/dL   Saturation Ratios 38 (H) 10.4 - 31.8 %   UIBC 142 ug/dL    Comment: Performed at [facility], [address] [place], [place] [address]  Immature Platelet Fraction     Status: Abnormal   Collection Time: 12/15/22  1:09 PM  Result Value Ref Range   Immature Platelet Fraction 26.2 (H) 1.2 - 8.6 %    Comment:        An elevated IPF indicates increased platelet production. A low platelet count with an elevated IPF may be associated with peripheral platelet destruction (e.g. DIC, ITP) or bone marrow recovery (e.g. after chemotherapy or transplant). A low platelet count with a low or non- elevated IPF is consistent with a platelet production disorder. Performed at [facility], [address] [place], [place] [address]   HIV Antibody (routine testing w rflx)     Status: None   Collection Time: 12/15/22  1:09 PM  Result Value Ref Range   HIV Screen 4th Generation wRfx Non Reactive Non Reactive    Comment: Performed at [facility], [address] [place], [place] [address]  Type and screen [facility]     Status: None   Collection Time: 12/15/22  1:17 PM  Result Value Ref Range   ABO/RH(D) O POS    Antibody Screen NEG    Sample Expiration      12/18/2022,2359 Performed at [facility], [address], [place], [place] [address]   Comprehensive metabolic panel     Status: Abnormal   Collection Time: 12/16/22  3:38 AM  Result Value Ref Range   Sodium 137 135 - 145 mmol/L   Potassium 3.8 3.5 - 5.1 mmol/L   Chloride 109 98 - 111 mmol/L   CO2 21 (L) 22 - 32 mmol/L   Glucose, Bld 131 (H) 70 -  99 mg/dL    Comment: Glucose reference range applies only to samples taken after fasting for at least 8 hours.   BUN 12 6 - 20 mg/dL   Creatinine, Ser 6.57 0.44 - 1.00 mg/dL   Calcium 8.7 (L) 8.9 - 10.3 mg/dL   Total Protein 5.5 (L) 6.5 - 8.1 g/dL   Albumin 3.2 (L) 3.5 - 5.0 g/dL   AST 14 (L) 15 - 41 U/L   ALT 15 0 - 44 U/L   Alkaline Phosphatase 35 (L) 38 - 126 U/L   Total Bilirubin 0.5 <1.2 mg/dL   GFR, Estimated >84 >69 mL/min    Comment: (NOTE) Calculated using the CKD-EPI Creatinine Equation (2021)    Anion gap 7 5 - 15    Comment: Performed at [facility], [address], [place], [place] [address]  CBC     Status: Abnormal   Collection Time: 12/16/22  3:38 AM  Result Value Ref Range   WBC 5.2 4.0 - 10.5 K/uL    Comment: WHITE COUNT CONFIRMED ON SMEAR   RBC 2.36 (L) 3.87 - 5.11 MIL/uL   Hemoglobin 7.3 (L) 12.0 - 15.0 g/dL   HCT 84.1 (L) 32.4 - 40.1 %   MCV 94.5 80.0 - 100.0 fL   MCH 30.9 26.0 - 34.0 pg   MCHC 32.7 30.0 - 36.0 g/dL   RDW 02.7 25.3 - 66.4 %  Platelets 87 (L) 150 - 400 K/uL    Comment: Immature Platelet Fraction may be clinically indicated, consider ordering this additional test [id number] REPEATED TO VERIFY    nRBC 0.0 0.0 - 0.2 %    Comment: Performed at [facility], [address] [place], [place] [address]   CT HEAD WO CONTRAST ( )  Result Date: 12/15/2022 CLINICAL DATA:  Severe thrombocytopenia with headache, concern for intracranial bleeding EXAM: CT HEAD WITHOUT CONTRAST TECHNIQUE: Contiguous axial images were obtained from the base of the skull through the vertex without intravenous contrast. RADIATION DOSE REDUCTION: This exam was performed according to the departmental dose-optimization program which includes automated exposure control, adjustment of the mA and/or kV according to patient size and/or use of iterative reconstruction technique. COMPARISON:  None Available. FINDINGS: Brain: No evidence of acute infarction, hemorrhage,  mass, mass effect, or midline shift. No hydrocephalus or extra-axial fluid collection. 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 Vascular: No hyperdense vessel. Skull: Negative for fracture or focal lesion. Sinuses/Orbits: Mucosal thickening in the right sphenoid sinus. Otherwise clear paranasal sinuses. No acute finding in the orbits. Other: The mastoid air cells are well aerated. IMPRESSION: 1. No acute intracranial process. 2. Dysgenesis of the corpus callosum. Electronically Signed   By: Wiliam Ke M.D.   On: 12/15/2022 19:58    Pending Labs Unresulted Labs (From admission, onward)    None       Vitals/Pain Today's Vitals   12/16/22 0530 12/16/22 0649 12/16/22 0720 12/16/22 0800  BP:   123/73   Pulse:   (!) 120 (!) 122  Resp:   (!) 33 19  Temp:   99.3 F (37.4 C)   TempSrc:   Oral   SpO2:   100% 100%  Weight:      Height:      PainSc: 0-No pain 0-No pain      Isolation Precautions No active isolations  Medications Medications  0.9 %  sodium chloride infusion (Manually program via Guardrails IV Fluids) ( Intravenous Not Given 12/15/22 1334)  ferrous sulfate tablet 325 mg (has no administration in time range)  sodium chloride flush (NS) 0.9 % injection 3 mL (3 mLs Intravenous Given 12/16/22 0036)  acetaminophen (TYLENOL) tablet 650 mg (650 mg Oral Given 12/15/22 1448)    Or  acetaminophen (TYLENOL) suppository 650 mg ( Rectal See Alternative 12/15/22 1448)  polyethylene glycol (MIRALAX / GLYCOLAX) packet 17 g (has no administration in time range)  dexamethasone (DECADRON) tablet 40 mg (40 mg Oral Given 12/15/22 1441)  norethindrone-ethinyl estradiol (LOESTRIN) 1-20 MG-MCG tablet 1 tablet (has no administration in time range)  tranexamic acid (CYKLOKAPRON) injection 500 mg (500 mg Topical Given 12/15/22 0955)  medroxyPROGESTERone (PROVERA) tablet 20 mg (20 mg Oral Given 12/15/22 1034)  lactated ringers bolus 1,000 mL (0  mLs Intravenous Stopped 12/15/22 1237)  acetaminophen (TYLENOL) tablet 650 mg (650 mg Oral Given 12/15/22 1140)    Mobility walks with person assist     Focused Assessments    R Recommendations: See Admitting Provider Note  Report given to:   Additional Notes:

## 2022-12-16 NOTE — Progress Notes (Signed)
PROGRESS NOTE    Melinda [name]  ZOX:[id number] DOB: Mar 15, 1985 DOA: 12/15/2022 PCP: Melinda Salisbury, MD  Subjective: Pt seen and examined. Menses has slowed some since platelet transfusion.  Pt feels anxious. Wants to talk to dietician about better eating habits.  Tolerating decadron.   Hospital Course: HPI: Melinda [name] is a 37 y.o. 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   Patient reports 11 days of increased menstrual bleeding.  She has a history of heavy menstrual bleeding but has been significantly increased from previous and has noticed more clots.  Also noted to be lightheaded today.  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   Denies fevers, chills, chest pain, shortness breath, abdominal pain, constipation, diarrhea, nausea, vomiting.  Vital signs in the ED notable for heart rate in the 100s to 130s with improvement to the 100s   ED Course: At last check.  Blood pressure in the 100s to 140 systolic, respirate in the teens to 20s.  Lab workup included CBC with hemoglobin 8.9, platelets less than 5.  Patient received Tylenol, Provera, tranexamic acid, 1 L IV fluids, 1 unit of platelets.  [facility] ED to expedite platelet transfusion.  OB/GYN consulted but given thrombocytopenia stated that this is likely primarily a hematologic issue, I confirmed with them that they do not need to follow.  Hematology consulted and agreed with platelet transfusion and would forward patient information to the patient's hematologist with plan for inpatient consult.  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 Shows Dysgenesis of the corpus callosum.   Antibiotic Therapy: Anti-infectives (From admission, onward)    None       Procedures:   Consultants: Heme/Onc    Assessment and Plan: * Thrombocytopenia (HCC) 12-16-2022 heme/onc consult read/review and appreciated. Pt started on decadron 40 mg po daily. Has received 1 unit of platelet. Plt count has increased to 87K. Pt states her menses has slowed down since platelet transfusion. Monitor CBC  Menorrhagia 12-16-2022 restart OCP  Anemia 12-16-2022 pt started on oral iron by hematology  Iron/TIBC/Ferritin/ %Sat    Component Value Date/Time   IRON 85 12/15/2022 1309   IRON 85 07/17/2012 1530   TIBC 227 (L) 12/15/2022 1309   TIBC 445 (H) 07/17/2012 1530   FERRITIN 56 12/15/2022 1309   FERRITIN 67 03/22/2013 1410   IRONPCTSAT 38 (H) 12/15/2022 1309   IRONPCTSAT 19 (L) 07/17/2012 1530    Anxiety disorder 12-16-2022 will order atarax 25 mg tid prn   DVT prophylaxis: SCDs Start: 12/15/22 1256    Code Status: Full Code Family Communication: no family at bedside Disposition Plan: return home Reason for continuing need for hospitalization: monitoring platelets  Objective: Vitals:   12/16/22 0900 12/16/22 0934 12/16/22 0936 12/16/22 1200  BP: (!) 110/57 118/64  112/70  Pulse: (!) 126 (!) 129  (!) 123  Resp: 16 (!) 22  18  Temp:   99.2 F (37.3 C)   TempSrc:   Oral   SpO2: 97% 99%  100%  Weight:  Height:       No intake or output data in the 24 hours ending 12/16/22 1431 Filed Weights   12/15/22 0942  Weight: 65 kg    Examination:  Physical Exam Vitals and nursing note reviewed.  Constitutional:      General: She is not in acute distress.    Appearance: She is not toxic-appearing or diaphoretic.  HENT:     Head: Normocephalic and atraumatic.     Nose:  Nose normal.  Eyes:     General: No scleral icterus. Cardiovascular:     Rate and Rhythm: Regular rhythm. Tachycardia present.  Pulmonary:     Effort: Pulmonary effort is normal.     Breath sounds: Normal breath sounds.  Abdominal:     General: Bowel sounds are normal. There is no distension.     Palpations: Abdomen is soft.  Musculoskeletal:     Right lower leg: No edema.     Left lower leg: No edema.  Skin:    General: Skin is warm and dry.     Capillary Refill: Capillary refill takes less than 2 seconds.  Neurological:     General: No focal deficit present.     Mental Status: She is alert and oriented to person, place, and time.     Data Reviewed: I have personally reviewed following labs and imaging studies  CBC: Recent Labs  Lab 12/15/22 0936 12/16/22 0338  WBC 4.7 5.2  HGB 8.9* 7.3*  HCT 26.5* 22.3*  MCV 93.6 94.5  PLT <5* 87*   Basic Metabolic Panel: Recent Labs  Lab 12/15/22 1309 12/16/22 0338  NA 134* 137  K 3.6 3.8  CL 108 109  CO2 20* 21*  GLUCOSE 91 131*  BUN 10 12  CREATININE 0.74 0.84  CALCIUM 8.2* 8.7*   GFR: Estimated Creatinine Clearance: 82 mL/min (by C-G formula based on SCr of 0.84 mg/dL). Liver Function Tests: Recent Labs  Lab 12/15/22 1309 12/16/22 0338  AST 15 14*  ALT 12 15  ALKPHOS 35* 35*  BILITOT 0.6 0.5  PROT 5.1* 5.5*  ALBUMIN 3.1* 3.2*   Anemia Panel: Recent Labs    12/15/22 1309  FERRITIN 56  TIBC 227*  IRON 85    Radiology Studies: CT HEAD WO CONTRAST ( )  Result Date: 12/15/2022 CLINICAL DATA:  Severe thrombocytopenia with headache, concern for intracranial bleeding EXAM: CT HEAD WITHOUT CONTRAST TECHNIQUE: Contiguous axial images were obtained from the base of the skull through the vertex without intravenous contrast. RADIATION DOSE REDUCTION: This exam was performed according to the departmental dose-optimization program which includes automated exposure control, adjustment of the mA and/or kV according  to patient size and/or use of iterative reconstruction technique. COMPARISON:  None Available. FINDINGS: Brain: No evidence of acute infarction, hemorrhage, mass, mass effect, or midline shift. No hydrocephalus or extra-axial fluid collection. 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 Vascular: No hyperdense vessel. Skull: Negative for fracture or focal lesion. Sinuses/Orbits: Mucosal thickening in the right sphenoid sinus. Otherwise clear paranasal sinuses. No acute finding in the orbits. Other: The mastoid air cells are well aerated. IMPRESSION: 1. No acute intracranial process. 2. Dysgenesis of the corpus callosum. Electronically Signed   By: Wiliam Ke M.D.   On: 12/15/2022 19:58    Scheduled Meds:  dexamethasone  40 mg Oral Daily   ferrous sulfate  325 mg Oral Daily   norethindrone-ethinyl estradiol  1 tablet Oral Daily   sodium chloride flush  3 mL Intravenous Q12H   Continuous Infusions:   LOS: 0 days   Time spent: 40 minutes  Carollee Herter, DO  [facility]  12/16/2022, 2:31 PM

## 2022-12-16 NOTE — Assessment & Plan Note (Signed)
12-16-2022 heme/onc consult read/review and appreciated. Pt started on decadron 40 mg po daily. Has received 1 unit of platelet. Plt count has increased to 87K. Pt states her menses has slowed down since platelet transfusion. Monitor CBC

## 2022-12-16 NOTE — Plan of Care (Signed)
  Problem: Clinical Measurements: Goal: Ability to maintain clinical measurements within normal limits will improve Outcome: Progressing Goal: Diagnostic test results will improve Outcome: Progressing   Problem: Coping: Goal: Level of anxiety will decrease Outcome: Progressing   

## 2022-12-16 NOTE — Hospital Course (Signed)
HPI: Melinda [name] is a 37 y.o. 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   Patient reports 11 days of increased menstrual bleeding.  She has a history of heavy menstrual bleeding but has been significantly increased from previous and has noticed more clots.  Also noted to be lightheaded today.  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   Denies fevers, chills, chest pain, shortness breath, abdominal pain, constipation, diarrhea, nausea, vomiting.  Vital signs in the ED notable for heart rate in the 100s to 130s with improvement to the 100s   ED Course: At last check.  Blood pressure in the 100s to 140 systolic, respirate in the teens to 20s.  Lab workup included CBC with hemoglobin 8.9, platelets less than 5.  Patient received Tylenol, Provera, tranexamic acid, 1 L IV fluids, 1 unit of platelets.  [facility] ED to expedite platelet transfusion.  OB/GYN consulted but given thrombocytopenia stated that this is likely primarily a hematologic issue, I confirmed with them that they do not need to follow.  Hematology consulted and agreed with platelet transfusion and would forward patient information to the patient's hematologist with plan for inpatient consult.  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 Shows Dysgenesis of the corpus callosum.   Antibiotic Therapy: Anti-infectives (From admission, onward)    None        Procedures:   Consultants: Heme/Onc

## 2022-12-16 NOTE — Assessment & Plan Note (Signed)
12-16-2022 will order atarax 25 mg tid prn

## 2022-12-16 NOTE — ED Notes (Signed)
Patient monitoring transferred to portable monitor (X12) with central cardiac monitoring for transport to 5W.

## 2022-12-16 NOTE — Assessment & Plan Note (Signed)
12-16-2022 restart OCP

## 2022-12-16 NOTE — ED Notes (Signed)
[name] DO contacted by this RN regarding pt's HR sustaining over 120bpm.

## 2022-12-17 ENCOUNTER — Ambulatory Visit: Payer: BC Managed Care – PPO | Admitting: Family Medicine

## 2022-12-17 DIAGNOSIS — D696 Thrombocytopenia, unspecified: Secondary | ICD-10-CM | POA: Diagnosis not present

## 2022-12-17 LAB — BASIC METABOLIC PANEL
Anion gap: 7 (ref 5–15)
BUN: 13 mg/dL (ref 6–20)
CO2: 22 mmol/L (ref 22–32)
Calcium: 8.5 mg/dL — ABNORMAL LOW (ref 8.9–10.3)
Chloride: 111 mmol/L (ref 98–111)
Creatinine, Ser: 0.82 mg/dL (ref 0.44–1.00)
GFR, Estimated: >60 mL/min (ref >60)
Glucose, Bld: 133 mg/dL — ABNORMAL HIGH (ref 70–99)
Potassium: 4.1 mmol/L (ref 3.5–5.1)
Sodium: 140 mmol/L (ref 135–145)

## 2022-12-17 LAB — CBC
HCT: 25.6 % — ABNORMAL LOW (ref 36.0–46.0)
HCT: 25 % — ABNORMAL LOW (ref 36.0–46.0)
Hemoglobin: 8.4 g/dL — ABNORMAL LOW (ref 12.0–15.0)
Hemoglobin: 8.1 g/dL — ABNORMAL LOW (ref 12.0–15.0)
MCH: 29.3 pg (ref 26.0–34.0)
MCH: 28.9 pg (ref 26.0–34.0)
MCHC: 32.8 g/dL (ref 30.0–36.0)
MCHC: 32.4 g/dL (ref 30.0–36.0)
MCV: 89.2 fL (ref 80.0–100.0)
MCV: 89.3 fL (ref 80.0–100.0)
Platelets: 110 10*3/uL — ABNORMAL LOW (ref 150–400)
Platelets: 119 10*3/uL — ABNORMAL LOW (ref 150–400)
RBC: 2.87 MIL/uL — ABNORMAL LOW (ref 3.87–5.11)
RBC: 2.8 MIL/uL — ABNORMAL LOW (ref 3.87–5.11)
RDW: 17.9 % — ABNORMAL HIGH (ref 11.5–15.5)
RDW: 18.6 % — ABNORMAL HIGH (ref 11.5–15.5)
WBC: 17.2 10*3/uL — ABNORMAL HIGH (ref 4.0–10.5)
WBC: 17.1 10*3/uL — ABNORMAL HIGH (ref 4.0–10.5)
nRBC: 0.9 % — ABNORMAL HIGH (ref 0.0–0.2)
nRBC: 1.1 % — ABNORMAL HIGH (ref 0.0–0.2)

## 2022-12-17 LAB — CBC WITH DIFFERENTIAL/PLATELET
Abs Immature Granulocytes: 0.15 10*3/uL — ABNORMAL HIGH (ref 0.00–0.07)
Basophils Absolute: 0 10*3/uL (ref 0.0–0.1)
Basophils Relative: 0 %
Eosinophils Absolute: 0 10*3/uL (ref 0.0–0.5)
Eosinophils Relative: 0 %
HCT: 20.1 % — ABNORMAL LOW (ref 36.0–46.0)
Hemoglobin: 6.6 g/dL — CL (ref 12.0–15.0)
Immature Granulocytes: 1 %
Lymphocytes Relative: 12 %
Lymphs Abs: 1.5 10*3/uL (ref 0.7–4.0)
MCH: 31.3 pg (ref 26.0–34.0)
MCHC: 32.8 g/dL (ref 30.0–36.0)
MCV: 95.3 fL (ref 80.0–100.0)
Monocytes Absolute: 0.4 10*3/uL (ref 0.1–1.0)
Monocytes Relative: 3 %
Neutro Abs: 11 10*3/uL — ABNORMAL HIGH (ref 1.7–7.7)
Neutrophils Relative %: 84 %
Platelets: 109 10*3/uL — ABNORMAL LOW (ref 150–400)
RBC: 2.11 MIL/uL — ABNORMAL LOW (ref 3.87–5.11)
RDW: 13.5 % (ref 11.5–15.5)
WBC: 13.1 10*3/uL — ABNORMAL HIGH (ref 4.0–10.5)
nRBC: 0.5 % — ABNORMAL HIGH (ref 0.0–0.2)

## 2022-12-17 LAB — PREPARE RBC (CROSSMATCH)

## 2022-12-17 MED ORDER — NORETHINDRONE ACET-ETHINYL EST 1-20 MG-MCG PO TABS
1.0000 | ORAL_TABLET | Freq: Every day | ORAL | Status: DC
Start: 2022-12-18 — End: 2022-12-19
  Administered 2022-12-18 – 2022-12-19 (×2): 1 via ORAL
  Filled 2022-12-17: qty 1

## 2022-12-17 MED ORDER — MEGESTROL ACETATE 40 MG PO TABS
120.0000 mg | ORAL_TABLET | Freq: Every day | ORAL | Status: DC
  Administered 2022-12-17: 120 mg via ORAL
  Filled 2022-12-17: qty 3

## 2022-12-17 MED ORDER — ADULT MULTIVITAMIN W/MINERALS CH
1.0000 | ORAL_TABLET | Freq: Every day | ORAL | Status: DC
  Administered 2022-12-17 – 2022-12-19 (×3): 1 via ORAL
  Filled 2022-12-17 (×3): qty 1

## 2022-12-17 MED ORDER — SODIUM CHLORIDE 0.9% IV SOLUTION: Freq: Once | INTRAVENOUS | Status: DC

## 2022-12-17 MED ORDER — ENSURE ENLIVE PO LIQD
237.0000 mL | Freq: Two times a day (BID) | ORAL | Status: DC
Start: 2022-12-17 — End: 2022-12-19
  Administered 2022-12-17 – 2022-12-19 (×4): 237 mL via ORAL

## 2022-12-17 NOTE — Progress Notes (Signed)
Mobility Specialist Progress Note;   12/17/22 1405  Mobility  Activity Ambulated with assistance in hallway  Level of Assistance Standby assist, set-up cues, supervision of patient - no hands on  Assistive Device None  Distance Ambulated (ft) 225 ft  Activity Response Tolerated well  Mobility Referral Yes  Mobility visit 1 Mobility  Mobility Specialist Start Time (ACUTE ONLY) 1405  Mobility Specialist Stop Time (ACUTE ONLY) 1420  Mobility Specialist Time Calculation (min) (ACUTE ONLY) 15 min    During-mobility: Max 160  Pt eager for mobility. Required no physical assistance during ambulation, SV. Pt displaying heavy menstrual bleeding, needing a change before session. HR increased to 160s while ambulating, pt states she has a higher HR and anxiety at baseline. RN aware and monitored while ambulating. No c/o during session. Pt returned back to bed with all needs met.    Caesar Bookman Mobility Specialist Please contact via SecureChat or Rehab Office [phone]

## 2022-12-17 NOTE — Consult Note (Addendum)
Gynecology Consult Note  Melinda [name] is a 37 y.o. 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  She has been with fairly good control since placement of Nexplanon in April 2024.  She presented to our office 12/3 with increased bleeding for 2 weeks.  Labs were drawn and precautions given, and she presented to ED with worsening bleeding and symptoms of anemia.  In-office labs 12/3 included PLT 83, HGB 9.9.  At ED, PLT were <5.  During hospital stay, vaginal bleeding has persisted, but today has been somewhat improved, changing only 1x pad but was saturated. She is no longer requiring diapers.   O: BP 109/66 (BP Location: Left Arm)   Pulse 97   Temp 99.3 F (37.4 C) (Oral)   Resp 17   Ht 5\' 2"  (1.575 m)   Wt 65 kg   LMP 12/05/2022 (Exact Date)   SpO2 100%   BMI 26.21 kg/m   Gen: alert, well appearing, no distress Chest: nonlabored breathing CV: no peripheral edema Abdomen: soft, nontender Ext: no evidence of DVT   A/P:  Abnormal Uterine Bleeding Secondary to coagulopathy, possible ITP.  PLT improved with steroid Anemia noted today, s/p 2u pRBC today with appropriate rise in HGB 6.6 > 8.4 Most recent pelvic ultrasound in office (Jan 2024) was without uterine masses or abnormalities. Will repeat if additional heavy bleeding episode occurs, but suspect hematologic cause of current exacerbation Megace was previously ordered, though with progestin (Nexplanon) already on board, she would likely benefit from combined pill for estrogen stabilization of endometrium. Will order for tomorrow.  Discussed consideration of endometrial ablation to prevent further episodes, or even hysterectomy, however patient likely desires future childbearing. Instead, we may consider Mirena IUD as alternative to Nexplanon.  Patient has follow up appointment with our office on 12/10.  Please reach out with questions or  concerns.  Nilda Simmer MD [facility]

## 2022-12-17 NOTE — Progress Notes (Addendum)
  Patient's nurse reported that lab check showing low hemoglobin 6.6.  Per chart review patient has been admitted for thrombocytopenia/ITP flare and acute on chronic anemia.   -CBC showing WBC 13.1, hemoglobin 6.6, MCV 95, hematocrit 21 and platelet count has been improved 109. -Preparing 2 units of RBC and transfusing 1 unit of blood now.  Continue to monitor H&H and transfuse as needed to keep hemoglobin level above 7.

## 2022-12-17 NOTE — Progress Notes (Signed)
Melinda [name]   DOB:11-Nov-1985   VQ#:[id number]      ASSESSMENT & PLAN:  Thrombocytopenia/ITP flare -Patient admitted with platelets 5K.  Status post platelet transfusions with good response, platelets today 109K. -Patient started dexamethasone 40 mg p.o. daily x 4, today is day 3. -Repeat CBC with differential in AM.  2.  Worsening anemia -Likely due to menorrhagia -Repeat CBC this morning showed hemoglobin worsened 6.6.  Noted 2 units packed RBC was ordered earlier however 1 unit given this morning. Repeat HGB improved to 8.4.  Recommend repeat cbc in am, if hgb <7.0, transfuse prbc additional unit. -Repeat CBC with differential in AM -Patient reports she has appointment with GYN next week Tuesday. -follow-up with primary heme/Dr. Leonides Schanz upon discharge; has appt for 12/24, will move up appt for labs and appt to next week.   3.  Menorrhagia -Heavy menstrual flow since 12/05/2022 -Patient reports she has Nexplanon implant left upper extremity for birth control since April 2024.  States she had discussion with GYN regarding alternative birth control. -She has an appointment with her GYN next week Tuesday, 12/21/2022.    Code Status Full  Discharge planning: Discharge home when medically optimized  Subjective:  Patient seen awake alert and oriented x 3.  Reports feeling a little better, menstrual flow has improved although is still quite heavy, patient showed picture of menstrual pad which was soaked with blood.  Denies new complaints although admits to palpitations whenever she walks around.  Denies chest pain or dizziness.  No other acute distress noted.  Objective:  Vitals:   12/17/22 0730 12/17/22 0800  BP: 109/66 109/66  Pulse:  97  Resp:  17  Temp: 99.3 F (37.4 C) 99.3 F (37.4 C)  SpO2:  100%     Intake/Output Summary (Last 24 hours) at 12/17/2022 1004 Last data filed at 12/17/2022 0700 Gross per 24 hour  Intake 1030 ml  Output --  Net 1030 ml     REVIEW OF  SYSTEMS:   Constitutional: Denies fevers, chills or abnormal night sweats Eyes: Denies blurriness of vision, double vision or watery eyes Ears, nose, mouth, throat, and face: Denies mucositis or sore throat Respiratory: Denies cough, dyspnea or wheezes Cardiovascular: +palpitation, chest discomfort or lower extremity swelling Gastrointestinal:  Denies nausea, heartburn or change in bowel habits Skin: Denies abnormal skin rashes Lymphatics: Denies new lymphadenopathy or easy bruising Neurological: Denies numbness, tingling or new weaknesses Behavioral/Psych: Mood is stable, no new changes  All other systems were reviewed with the patient and are negative.  PHYSICAL EXAMINATION: ECOG PERFORMANCE STATUS: 1 - Symptomatic but completely ambulatory  Vitals:   12/17/22 0730 12/17/22 0800  BP: 109/66 109/66  Pulse:  97  Resp:  17  Temp: 99.3 F (37.4 C) 99.3 F (37.4 C)  SpO2:  100%   Filed Weights   12/15/22 0942  Weight: 143 lb 4.8 oz (65 kg)    GENERAL: alert, no distress and comfortable SKIN: skin color, texture, turgor are normal, no rashes or significant lesions EYES: normal, conjunctiva are pink and non-injected, sclera clear OROPHARYNX: no exudate, no erythema and lips, buccal mucosa, and tongue normal  NECK: supple, thyroid normal size, non-tender, without nodularity LYMPH: no palpable lymphadenopathy in the cervical, axillary or inguinal LUNGS: clear to auscultation and percussion with normal breathing effort HEART: regular rate & rhythm and no murmurs and no lower extremity edema ABDOMEN: abdomen soft, non-tender and normal bowel sounds MUSCULOSKELETAL: no cyanosis of digits and no clubbing  PSYCH: alert &  oriented x 3 with fluent speech NEURO: no focal motor/sensory deficits   All questions were answered. The patient knows to call the clinic with any problems, questions or concerns.   The total time spent in the appointment was 25 minutes encounter with patient  including review of chart and various tests results, discussions about plan of care and coordination of care plan  Dawson Bills, NP 12/17/2022 10:04 AM    Labs Reviewed:  Lab Results  Component Value Date   WBC 13.1 (H) 12/17/2022   HGB 6.6 (LL) 12/17/2022   HCT 20.1 (L) 12/17/2022   MCV 95.3 12/17/2022   PLT 109 (L) 12/17/2022   Recent Labs    05/26/22 1439 12/15/22 1309 12/16/22 0338 12/17/22 0332  NA 138 134* 137 140  K 3.8 3.6 3.8 4.1  CL 109 108 109 111  CO2 25 20* 21* 22  GLUCOSE 83 91 131* 133*  BUN 13 10 12 13   CREATININE 0.86 0.74 0.84 0.82  CALCIUM 8.5* 8.2* 8.7* 8.5*  GFRNONAA >60 >60 >60 >60  PROT 6.8 5.1* 5.5*  --   ALBUMIN 4.1 3.1* 3.2*  --   AST 17 15 14*  --   ALT 21 12 15   --   ALKPHOS 51 35* 35*  --   BILITOT 0.3 0.6 0.5  --     Studies Reviewed:  CT HEAD WO CONTRAST ( )  Result Date: 12/15/2022 CLINICAL DATA:  Severe thrombocytopenia with headache, concern for intracranial bleeding EXAM: CT HEAD WITHOUT CONTRAST TECHNIQUE: Contiguous axial images were obtained from the base of the skull through the vertex without intravenous contrast. RADIATION DOSE REDUCTION: This exam was performed according to the departmental dose-optimization program which includes automated exposure control, adjustment of the mA and/or kV according to patient size and/or use of iterative reconstruction technique. COMPARISON:  None Available. FINDINGS: Brain: No evidence of acute infarction, hemorrhage, mass, mass effect, or midline shift. No hydrocephalus or extra-axial fluid collection. 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 Vascular: No hyperdense vessel. Skull: Negative for fracture or focal lesion. Sinuses/Orbits: Mucosal thickening in the right sphenoid sinus. Otherwise clear paranasal sinuses. No acute finding in the orbits. Other: The mastoid air cells are well aerated. IMPRESSION: 1. No acute intracranial process.  2. Dysgenesis of the corpus callosum. Electronically Signed   By: Wiliam Ke M.D.   On: 12/15/2022 19:58

## 2022-12-17 NOTE — Progress Notes (Signed)
PROGRESS NOTE                                                                                                                                                                                                             Patient Demographics:    Melinda [name], is a 37 y.o. female, DOB - 1985-08-27, ZYS:[id number]  Outpatient Primary MD for the patient is Nelwyn Salisbury, MD    LOS - 1  Admit date - 12/15/2022    Chief Complaint  Patient presents with   Vaginal Bleeding       Brief Narrative (HPI from H&P)    37 y.o. 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   Patient reports 11 days of increased menstrual bleeding.  She has a history of heavy menstrual bleeding but has been significantly increased from previous and has noticed more clots.  Also noted to be lightheaded today.  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   Subjective:    [name] today has, No headache, No chest pain, No abdominal pain - No Nausea, No new weakness tingling or numbness, no SOB   Assessment  & Plan :   Symptomatic acute blood loss anemia caused by menorrhagia in the setting of ITP induced thrombocytopenia  Patient has had history of ITP, upon admission platelet count was less than 10,000, seen by hematology, received 1 unit of blood transfusion along with IV steroids with improvement in her platelet count, currently stable will transfuse again if it drops below 20,000 with ongoing heavy menorrhagia.  Plan per hematology is to discharge her on oral steroids as below, continue to monitor CBC.  Getting 2 units of packed RBC transfusion on 12/17/2022.  - Dexamethasone 40 mg po daily x4, day 2 today 12/5    Menorrhagia   Currently on Megace, OB to  see, OCP held by [facility] team on 12/17/2022, will defer it to them.  Acute blood loss anemia kindly see #1 above   12-16-2022 pt started on oral iron by hematology  Anxiety disorder   12-16-2022 will order atarax 25 mg tid prn         Condition - Fair  Family Communication  :  none  Code Status :  Full  Consults  :  Onc, OB  PUD Prophylaxis :    Procedures  :            Disposition Plan  :    Status is: Inpatient   DVT Prophylaxis  :    SCDs Start: 12/15/22 1256    Lab Results  Component Value Date   PLT 109 (L) 12/17/2022    Diet :  Diet Order             Diet regular Fluid consistency: Thin  Diet effective now                    Inpatient Medications  Scheduled Meds:  sodium chloride   Intravenous Once   dexamethasone  40 mg Oral Daily   ferrous sulfate  325 mg Oral Daily   megestrol  120 mg Oral Daily   sodium chloride flush  3 mL Intravenous Q12H   Continuous Infusions: PRN Meds:.acetaminophen **OR** acetaminophen, hydrOXYzine, melatonin, ondansetron (ZOFRAN) IV, polyethylene glycol  Antibiotics  :    Anti-infectives (From admission, onward)    None         Objective:   Vitals:   12/17/22 0511 12/17/22 0545 12/17/22 0730 12/17/22 0800  BP: 111/65 120/73 109/66 109/66  Pulse: (!) 120 (!) 138  97  Resp: 18 18  17   Temp: 99.4 F (37.4 C) 99.3 F (37.4 C) 99.3 F (37.4 C) 99.3 F (37.4 C)  TempSrc: Oral Oral Oral Oral  SpO2: 100% 92%  100%  Weight:      Height:        Wt Readings from Last 3 Encounters:  12/15/22 65 kg  07/21/22 65.2 kg  07/14/22 64.6 kg     Intake/Output Summary (Last 24 hours) at 12/17/2022 0901 Last data filed at 12/17/2022 0700 Gross per 24 hour  Intake 1030 ml  Output --  Net 1030 ml     Physical Exam  Awake Alert, No new F.N deficits, Normal affect [place].AT,PERRAL Supple Neck, No JVD,   Symmetrical Chest wall movement, Good air movement bilaterally, CTAB RRR,No Gallops,Rubs or new Murmurs,   +ve B.Sounds, Abd Soft, No tenderness,   No Cyanosis, Clubbing or edema        Data Review:    Recent Labs  Lab 12/15/22 0936 12/16/22 0338 12/17/22 0332  WBC 4.7 5.2 13.1*  HGB 8.9* 7.3* 6.6*  HCT 26.5* 22.3* 20.1*  PLT <5* 87* 109*  MCV 93.6 94.5 95.3  MCH 31.4 30.9 31.3  MCHC 33.6 32.7 32.8  RDW 12.5 13.0 13.5  LYMPHSABS  --   --  1.5  MONOABS  --   --  0.4  EOSABS  --   --  0.0  BASOSABS  --   --  0.0    Recent Labs  Lab 12/15/22 1309 12/16/22 0338 12/17/22 0332  NA 134* 137 140  K 3.6 3.8 4.1  CL 108 109 111  CO2 20* 21* 22  ANIONGAP 6 7 7   GLUCOSE 91 131* 133*  BUN 10 12 13   CREATININE 0.74 0.84 0.82  AST 15 14*  --   ALT 12 15  --   ALKPHOS 35* 35*  --   BILITOT 0.6 0.5  --   ALBUMIN 3.1* 3.2*  --   CALCIUM 8.2* 8.7* 8.5*      Recent Labs  Lab 12/15/22 1309 12/16/22 0338 12/17/22 0332  CALCIUM 8.2* 8.7* 8.5*  Recent Labs    12/15/22 1309  FERRITIN 56  TIBC 227*  IRON 85     Radiology Reports CT HEAD WO CONTRAST ( )  Result Date: 12/15/2022 CLINICAL DATA:  Severe thrombocytopenia with headache, concern for intracranial bleeding EXAM: CT HEAD WITHOUT CONTRAST TECHNIQUE: Contiguous axial images were obtained from the base of the skull through the vertex without intravenous contrast. RADIATION DOSE REDUCTION: This exam was performed according to the departmental dose-optimization program which includes automated exposure control, adjustment of the mA and/or kV according to patient size and/or use of iterative reconstruction technique. COMPARISON:  None Available. FINDINGS: Brain: No evidence of acute infarction, hemorrhage, mass, mass effect, or midline shift. No hydrocephalus or extra-axial fluid collection. 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 Vascular: No hyperdense vessel. Skull: Negative for fracture or focal lesion. Sinuses/Orbits: Mucosal thickening in the right sphenoid  sinus. Otherwise clear paranasal sinuses. No acute finding in the orbits. Other: The mastoid air cells are well aerated. IMPRESSION: 1. No acute intracranial process. 2. Dysgenesis of the corpus callosum. Electronically Signed   By: Wiliam Ke M.D.   On: 12/15/2022 19:58      Signature  -   Susa Raring M.D on 12/17/2022 at 9:01 AM   -  To page go to [email or web address]

## 2022-12-17 NOTE — Plan of Care (Signed)

## 2022-12-17 NOTE — Progress Notes (Signed)
Initial Nutrition Assessment  DOCUMENTATION CODES:   Not applicable  INTERVENTION:  Continue with current diet. Ensure Plus High Protein po BID, each supplement provides 350 kcal and 20 grams of protein. Snack TID Multivitamin/minerals   NUTRITION DIAGNOSIS:   Increased nutrient needs related to acute illness as evidenced by estimated needs.    GOAL:   Patient will meet greater than or equal to 90% of their needs    MONITOR:   PO intake, Supplement acceptance, Labs, Weight trends  REASON FOR ASSESSMENT:   Consult Diet education  ASSESSMENT:  37 y.o. F, transferred from [facility] to [facility] ED with increased menstrual bleeding. Admitted with thrombocytopenia. 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  Review of EMR revealed: No new diagnosis that would require diet education.  Good appetite. No weight loss. Reviewed with current MD and he said that he did not put Education order in and that to just assess nutritional status of Pt.  Patient stated that she was starving and has had no appetite changes.  She stated she had no dietary concerns at this time. No weight loss. Discussed increased energy needs and she was agreeable to snacks and supplements. She stated that she is eating 100 % of her meals.   Admit weight: 65 kg Current weight: 65 kg  Weight history: 12/15/22 65 kg  07/21/22 65.2 kg  07/14/22 64.6 kg  07/09/22 64.3 kg  06/25/22 64.7 kg  06/14/22 65.7 kg  06/02/22 65 kg  05/26/22 65.4 kg  03/23/22 64.3 kg  03/12/22 64 kg      Average Meal Intake:  100% intake x 1 recorded meals  Nutritionally Relevant Medications: Scheduled Meds:  ferrous sulfate  325 mg Oral Daily   megestrol  120 mg Oral Daily     Labs Reviewed: CBG ranges from 131-133 mg/dL over the last 24 hours    NUTRITION - FOCUSED PHYSICAL EXAM:  Flowsheet Row Most Recent Value  Orbital  Region No depletion  Upper Arm Region No depletion  Thoracic and Lumbar Region No depletion  Buccal Region No depletion  Temple Region No depletion  Clavicle Bone Region No depletion  Clavicle and Acromion Bone Region No depletion  Scapular Bone Region No depletion  Dorsal Hand No depletion  Patellar Region No depletion  Anterior Thigh Region No depletion  Posterior Calf Region No depletion  Edema (RD Assessment) None  Hair Reviewed  Eyes Reviewed  Mouth Reviewed  Skin Reviewed  Nails Reviewed       Diet Order:   Diet Order             Diet regular Fluid consistency: Thin  Diet effective now                   EDUCATION NEEDS:      Skin:  Skin Assessment: Reviewed RN Assessment  Last BM:  PTA  Height:   Ht Readings from Last 1 Encounters:  12/15/22 5\' 2"  (1.575 m)    Weight:   Wt Readings from Last 1 Encounters:  12/15/22 65 kg    Ideal Body Weight:     BMI:  Body mass index is 26.21 kg/m.  Estimated Nutritional Needs:   Kcal:  1950-2275 kcal/d  Protein:  85-100 g/d  Fluid:  87ml/kcal    Jamelle Haring RDN, LDN Clinical Dietitian  Pleas see Amion for contact information

## 2022-12-18 DIAGNOSIS — D696 Thrombocytopenia, unspecified: Secondary | ICD-10-CM | POA: Diagnosis not present

## 2022-12-18 LAB — TSH: TSH: 0.786 u[IU]/mL (ref 0.350–4.500)

## 2022-12-18 LAB — CBC WITH DIFFERENTIAL/PLATELET
Abs Immature Granulocytes: 0.51 10*3/uL — ABNORMAL HIGH (ref 0.00–0.07)
Basophils Absolute: 0 10*3/uL (ref 0.0–0.1)
Basophils Relative: 0 %
Eosinophils Absolute: 0 10*3/uL (ref 0.0–0.5)
Eosinophils Relative: 0 %
HCT: 25.9 % — ABNORMAL LOW (ref 36.0–46.0)
Hemoglobin: 8.3 g/dL — ABNORMAL LOW (ref 12.0–15.0)
Immature Granulocytes: 3 %
Lymphocytes Relative: 15 %
Lymphs Abs: 3 10*3/uL (ref 0.7–4.0)
MCH: 28.8 pg (ref 26.0–34.0)
MCHC: 32 g/dL (ref 30.0–36.0)
MCV: 89.9 fL (ref 80.0–100.0)
Monocytes Absolute: 1.3 10*3/uL — ABNORMAL HIGH (ref 0.1–1.0)
Monocytes Relative: 6 %
Neutro Abs: 14.7 10*3/uL — ABNORMAL HIGH (ref 1.7–7.7)
Neutrophils Relative %: 76 %
Platelets: 124 10*3/uL — ABNORMAL LOW (ref 150–400)
RBC: 2.88 MIL/uL — ABNORMAL LOW (ref 3.87–5.11)
RDW: 18.3 % — ABNORMAL HIGH (ref 11.5–15.5)
WBC: 19.5 10*3/uL — ABNORMAL HIGH (ref 4.0–10.5)
nRBC: 1 % — ABNORMAL HIGH (ref 0.0–0.2)

## 2022-12-18 LAB — BASIC METABOLIC PANEL
Anion gap: 9 (ref 5–15)
BUN: 14 mg/dL (ref 6–20)
CO2: 21 mmol/L — ABNORMAL LOW (ref 22–32)
Calcium: 8.7 mg/dL — ABNORMAL LOW (ref 8.9–10.3)
Chloride: 109 mmol/L (ref 98–111)
Creatinine, Ser: 0.78 mg/dL (ref 0.44–1.00)
GFR, Estimated: >60 mL/min (ref >60)
Glucose, Bld: 109 mg/dL — ABNORMAL HIGH (ref 70–99)
Potassium: 3.8 mmol/L (ref 3.5–5.1)
Sodium: 139 mmol/L (ref 135–145)

## 2022-12-18 LAB — T4, FREE: Free T4: 0.61 ng/dL (ref 0.61–1.12)

## 2022-12-18 LAB — MAGNESIUM: Magnesium: 2.2 mg/dL (ref 1.7–2.4)

## 2022-12-18 MED ORDER — METOPROLOL TARTRATE 25 MG PO TABS
25.0000 mg | ORAL_TABLET | Freq: Two times a day (BID) | ORAL | Status: DC
  Administered 2022-12-18 – 2022-12-19 (×2): 25 mg via ORAL
  Filled 2022-12-18: qty 1
  Filled 2022-12-18: qty 2
  Filled 2022-12-18: qty 1

## 2022-12-18 NOTE — Plan of Care (Signed)
  Problem: Health Behavior/Discharge Planning: Goal: Ability to manage health-related needs will improve Outcome: Progressing   Problem: Clinical Measurements: Goal: Ability to maintain clinical measurements within normal limits will improve Outcome: Progressing Goal: Diagnostic test results will improve Outcome: Progressing Goal: Cardiovascular complication will be avoided Outcome: Progressing   Problem: Coping: Goal: Level of anxiety will decrease Outcome: Progressing

## 2022-12-18 NOTE — Progress Notes (Signed)
PROGRESS NOTE                                                                                                                                                                                                             Patient Demographics:    Spiritual Bacino, is a 37 y.o. female, DOB - [birth year], ZOX:[id number]  Outpatient Primary MD for the patient is Nelwyn Salisbury, MD    LOS - 2  Admit date - 12/15/2022    Chief Complaint  Patient presents with   Vaginal Bleeding       Brief Narrative (HPI from H&P)    37 y.o. 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   Patient reports 11 days of increased menstrual bleeding.  She has a history of heavy menstrual bleeding but has been significantly increased from previous and has noticed more clots.  Also noted to be lightheaded today.  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   Subjective:   Patient in bed, appears comfortable, denies any headache, no fever, no chest pain or pressure, no shortness of breath , no abdominal pain. No new focal weakness. Does feel a few palpitations.   Assessment  & Plan :   Symptomatic acute blood loss anemia caused by menorrhagia in the setting of ITP induced thrombocytopenia  Patient has had history of ITP, upon admission platelet count was less than 10,000, seen by hematology, received 1 unit of blood transfusion along with IV steroids with improvement in her platelet count, currently stable will transfuse again if it drops below 20,000 with ongoing heavy menorrhagia.  Plan per hematology is to discharge her on oral steroids as below, continue to monitor CBC.  Getting 2 units of packed RBC transfusion on 12/17/2022.  - Dexamethasone 40 mg po daily x4,  day 2 today 12/5    Menorrhagia   Currently on Megace, OB to see, OCP held by [facility] team on 12/17/2022, will defer it to them.  Acute blood loss anemia kindly see #1 above   12-16-2022 pt started on oral iron by hematology  Anxiety disorder   12-16-2022 will order atarax 25 mg tid prn  Palpitations overnight with sinus tachycardia  in mid 120s to mid 130s.  Check TSH, free T4, echocardiogram, low-dose beta-blocker, says she gets tachycardic when she is excited.  Will monitor.      Condition - Fair  Family Communication  :  none  Code Status :  Full  Consults  :  Onc, OB  PUD Prophylaxis :    Procedures  :     TTE        Disposition Plan  :    Status is: Inpatient   DVT Prophylaxis  :    SCDs Start: 12/15/22 1256    Lab Results  Component Value Date   PLT 124 (L) 12/18/2022    Diet :  Diet Order             Diet regular Fluid consistency: Thin  Diet effective now                    Inpatient Medications  Scheduled Meds:  sodium chloride   Intravenous Once   sodium chloride   Intravenous Once   dexamethasone  40 mg Oral Daily   feeding supplement  237 mL Oral BID BM   ferrous sulfate  325 mg Oral Daily   multivitamin with minerals  1 tablet Oral Daily   norethindrone-ethinyl estradiol  1 tablet Oral Daily   sodium chloride flush  3 mL Intravenous Q12H   Continuous Infusions: PRN Meds:.acetaminophen **OR** acetaminophen, hydrOXYzine, melatonin, ondansetron (ZOFRAN) IV, polyethylene glycol  Antibiotics  :    Anti-infectives (From admission, onward)    None         Objective:   Vitals:   12/17/22 2000 12/17/22 2118 12/18/22 0200 12/18/22 0215  BP: 108/61   110/68  Pulse: (!) 108 (!) 107 92 85  Resp: 18 19 19 19   Temp: 98.2 F (36.8 C)     TempSrc: Oral     SpO2: 98% 100% 100% 100%  Weight:      Height:        Wt Readings from Last 3 Encounters:  12/15/22 65 kg  07/21/22 65.2 kg  07/14/22 64.6 kg    No intake or output data in  the 24 hours ending 12/18/22 9604    Physical Exam  Awake Alert, No new F.N deficits, Normal affect [place].AT,PERRAL Supple Neck, No JVD,   Symmetrical Chest wall movement, Good air movement bilaterally, CTAB Rapid RRR,No Gallops,Rubs or new Murmurs,  +ve B.Sounds, Abd Soft, No tenderness,   No Cyanosis, Clubbing or edema        Data Review:    Recent Labs  Lab 12/16/22 0338 12/17/22 0332 12/17/22 1234 12/17/22 1803 12/18/22 0602  WBC 5.2 13.1* 17.2* 17.1* 19.5*  HGB 7.3* 6.6* 8.4* 8.1* 8.3*  HCT 22.3* 20.1* 25.6* 25.0* 25.9*  PLT 87* 109* 110* 119* 124*  MCV 94.5 95.3 89.2 89.3 89.9  MCH 30.9 31.3 29.3 28.9 28.8  MCHC 32.7 32.8 32.8 32.4 32.0  RDW 13.0 13.5 17.9* 18.6* 18.3*  LYMPHSABS  --  1.5  --   --  3.0  MONOABS  --  0.4  --   --  1.3*  EOSABS  --  0.0  --   --  0.0  BASOSABS  --  0.0  --   --  0.0    Recent Labs  Lab 12/15/22 1309 12/16/22 0338 12/17/22 0332 12/18/22 0602  NA 134* 137 140 139  K 3.6 3.8 4.1 3.8  CL 108 109 111 109  CO2 20* 21*  22 21*  ANIONGAP 6 7 7 9   GLUCOSE 91 131* 133* 109*  BUN 10 12 13 14   CREATININE 0.74 0.84 0.82 0.78  AST 15 14*  --   --   ALT 12 15  --   --   ALKPHOS 35* 35*  --   --   BILITOT 0.6 0.5  --   --   ALBUMIN 3.1* 3.2*  --   --   MG  --   --   --  2.2  CALCIUM 8.2* 8.7* 8.5* 8.7*      Recent Labs  Lab 12/15/22 1309 12/16/22 0338 12/17/22 0332 12/18/22 0602  MG  --   --   --  2.2  CALCIUM 8.2* 8.7* 8.5* 8.7*     Recent Labs    12/15/22 1309  FERRITIN 56  TIBC 227*  IRON 85     Radiology Reports CT HEAD WO CONTRAST ( )  Result Date: 12/15/2022 CLINICAL DATA:  Severe thrombocytopenia with headache, concern for intracranial bleeding EXAM: CT HEAD WITHOUT CONTRAST TECHNIQUE: Contiguous axial images were obtained from the base of the skull through the vertex without intravenous contrast. RADIATION DOSE REDUCTION: This exam was performed according to the departmental dose-optimization program which  includes automated exposure control, adjustment of the mA and/or kV according to patient size and/or use of iterative reconstruction technique. COMPARISON:  None Available. FINDINGS: Brain: No evidence of acute infarction, hemorrhage, mass, mass effect, or midline shift. No hydrocephalus or extra-axial fluid collection. 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 Vascular: No hyperdense vessel. Skull: Negative for fracture or focal lesion. Sinuses/Orbits: Mucosal thickening in the right sphenoid sinus. Otherwise clear paranasal sinuses. No acute finding in the orbits. Other: The mastoid air cells are well aerated. IMPRESSION: 1. No acute intracranial process. 2. Dysgenesis of the corpus callosum. Electronically Signed   By: Wiliam Ke M.D.   On: 12/15/2022 19:58      Signature  -   Susa Raring M.D on 12/18/2022 at 8:23 AM   -  To page go to [email or web address]

## 2022-12-18 NOTE — Plan of Care (Signed)
  Problem: Education: Goal: Knowledge of General Education information will improve Description: Including pain rating scale, medication(s)/side effects and non-pharmacologic comfort measures Outcome: Progressing   Problem: Health Behavior/Discharge Planning: Goal: Ability to manage health-related needs will improve Outcome: Progressing   Problem: Clinical Measurements: Goal: Ability to maintain clinical measurements within normal limits will improve Outcome: Progressing Goal: Will remain free from infection Outcome: Progressing Goal: Diagnostic test results will improve Outcome: Progressing Goal: Respiratory complications will improve Outcome: Progressing Goal: Cardiovascular complication will be avoided Outcome: Progressing   Problem: Activity: Goal: Risk for activity intolerance will decrease Outcome: Progressing   Problem: Nutrition: Goal: Adequate nutrition will be maintained Outcome: Progressing   Problem: Pain Management: Goal: General experience of comfort will improve Outcome: Progressing   Problem: Safety: Goal: Ability to remain free from injury will improve Outcome: Progressing   Problem: Skin Integrity: Goal: Risk for impaired skin integrity will decrease Outcome: Progressing

## 2022-12-18 NOTE — Progress Notes (Signed)
Mobility Specialist Progress Note:   12/18/22 1330  Mobility  Activity Ambulated independently in hallway  Level of Assistance Independent after set-up  Assistive Device None  Distance Ambulated (ft) 350 ft  Activity Response Tolerated well  Mobility Referral Yes  Mobility visit 1 Mobility  Mobility Specialist Start Time (ACUTE ONLY) 1320  Mobility Specialist Stop Time (ACUTE ONLY) 1330  Mobility Specialist Time Calculation (min) (ACUTE ONLY) 10 min   Pt received in bed, grandmother at bedside. Ambulated independently after set up in hallway. Tolerated well, max HR 111 bpm, RN notified. Pt suggests anxiety responsible for her recent elevated high HR. Returned pt to room, all needs met.   Feliciana Rossetti Mobility Specialist Please contact via Special educational needs teacher or  Rehab office at [phone]

## 2022-12-19 ENCOUNTER — Other Ambulatory Visit (HOSPITAL_COMMUNITY): Payer: BC Managed Care – PPO

## 2022-12-19 DIAGNOSIS — I509 Heart failure, unspecified: Secondary | ICD-10-CM

## 2022-12-19 DIAGNOSIS — D696 Thrombocytopenia, unspecified: Secondary | ICD-10-CM | POA: Diagnosis not present

## 2022-12-19 LAB — BASIC METABOLIC PANEL
Anion gap: 8 (ref 5–15)
BUN: 13 mg/dL (ref 6–20)
CO2: 21 mmol/L — ABNORMAL LOW (ref 22–32)
Calcium: 8.7 mg/dL — ABNORMAL LOW (ref 8.9–10.3)
Chloride: 108 mmol/L (ref 98–111)
Creatinine, Ser: 0.86 mg/dL (ref 0.44–1.00)
GFR, Estimated: >60 mL/min (ref >60)
Glucose, Bld: 111 mg/dL — ABNORMAL HIGH (ref 70–99)
Potassium: 3.5 mmol/L (ref 3.5–5.1)
Sodium: 137 mmol/L (ref 135–145)

## 2022-12-19 LAB — TYPE AND SCREEN
ABO/RH(D): O POS
Antibody Screen: NEGATIVE
Unit division: 0
Unit division: 0

## 2022-12-19 LAB — CBC WITH DIFFERENTIAL/PLATELET
Abs Immature Granulocytes: 0.78 10*3/uL — ABNORMAL HIGH (ref 0.00–0.07)
Basophils Absolute: 0 10*3/uL (ref 0.0–0.1)
Basophils Relative: 0 %
Eosinophils Absolute: 0 10*3/uL (ref 0.0–0.5)
Eosinophils Relative: 0 %
HCT: 28.4 % — ABNORMAL LOW (ref 36.0–46.0)
Hemoglobin: 9 g/dL — ABNORMAL LOW (ref 12.0–15.0)
Immature Granulocytes: 4 %
Lymphocytes Relative: 18 %
Lymphs Abs: 3.5 10*3/uL (ref 0.7–4.0)
MCH: 29.4 pg (ref 26.0–34.0)
MCHC: 31.7 g/dL (ref 30.0–36.0)
MCV: 92.8 fL (ref 80.0–100.0)
Monocytes Absolute: 1.5 10*3/uL — ABNORMAL HIGH (ref 0.1–1.0)
Monocytes Relative: 7 %
Neutro Abs: 14 10*3/uL — ABNORMAL HIGH (ref 1.7–7.7)
Neutrophils Relative %: 71 %
Platelets: 134 10*3/uL — ABNORMAL LOW (ref 150–400)
RBC: 3.06 MIL/uL — ABNORMAL LOW (ref 3.87–5.11)
RDW: 18.6 % — ABNORMAL HIGH (ref 11.5–15.5)
WBC: 19.7 10*3/uL — ABNORMAL HIGH (ref 4.0–10.5)
nRBC: 2.9 % — ABNORMAL HIGH (ref 0.0–0.2)

## 2022-12-19 LAB — ECHOCARDIOGRAM COMPLETE
AR max vel: 2.41 cm2
AV Area VTI: 2.48 cm2
AV Area mean vel: 2.47 cm2
AV Mean grad: 3 mm[Hg]
AV Peak grad: 5.8 mm[Hg]
Ao pk vel: 1.2 m/s
Area-P 1/2: 5.34 cm2
Height: 62 in
P 1/2 time: 523 ms
S' Lateral: 2.2 cm
Weight: 2292.78 [oz_av]

## 2022-12-19 LAB — BPAM RBC
Blood Product Expiration Date: 202412272359
Blood Product Expiration Date: 202412272359
ISSUE DATE / TIME: 202412060439
Unit Type and Rh: 5100
Unit Type and Rh: 5100

## 2022-12-19 MED ORDER — FERROUS SULFATE 325 (65 FE) MG PO TABS: 325.0000 mg | ORAL_TABLET | Freq: Two times a day (BID) | ORAL | 2 refills | Status: AC

## 2022-12-19 MED ORDER — HYDROXYZINE HCL 25 MG PO TABS: 25.0000 mg | ORAL_TABLET | Freq: Three times a day (TID) | ORAL | 0 refills | Status: DC | PRN

## 2022-12-19 MED ORDER — FOLIC ACID 1 MG PO TABS
1.0000 mg | ORAL_TABLET | Freq: Every day | ORAL | 0 refills | Status: DC
Start: 2022-12-19 — End: 2022-12-22

## 2022-12-19 MED ORDER — METOPROLOL TARTRATE 25 MG PO TABS: 25.0000 mg | ORAL_TABLET | Freq: Two times a day (BID) | ORAL | 0 refills | Status: DC

## 2022-12-19 NOTE — Discharge Summary (Signed)
Melinda [name] ZOX:[id number] DOB: 1985-09-05 DOA: 12/15/2022  PCP: Nelwyn Salisbury, MD  Admit date: 12/15/2022  Discharge date: 12/19/2022  Admitted From: Home   Disposition:  Home   Recommendations for Outpatient Follow-up:   Follow up with PCP in 1-2 weeks  PCP Please obtain BMP/CBC, 2 view CXR in 1week,  (see Discharge instructions)   PCP Please follow up on the following pending results: CBC, anemia panel closely.  Needs close outpatient follow-up with OB and hematology.  Also benefit from outpatient psych follow-up for extreme anxiety.   Home Health: None   Equipment/Devices: None  Consultations: OB Discharge Condition: Stable    CODE STATUS: Full    Diet Recommendation: Heart Healthy     Chief Complaint  Patient presents with   Vaginal Bleeding     Brief history of present illness from the day of admission and additional interim summary    37 y.o. 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   Patient reports 11 days of increased menstrual bleeding.  She has a history of heavy menstrual bleeding but has been significantly increased from previous and has noticed more clots.  Also noted to be lightheaded today.  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                                                                 Hospital Course   Symptomatic acute blood loss anemia caused by menorrhagia in the setting of ITP induced thrombocytopenia   Patient has had history of ITP, upon admission platelet count was less than 10,000, seen by hematology, received 1 unit of blood transfusion along with IV steroids with  improvement in her platelet count, currently stable will transfuse again if it drops below 20,000 with ongoing heavy menorrhagia.  She received 2 units of packed RBC transfusion on 12/17/2022.  Stable CBC now with stable hemoglobin and platelet levels.   She has already finished her dexamethasone 40 mg po daily x 4 doses, PCP to monitor CBC along with anemia panel closely in the outpatient setting arrange for outpatient hematology and OB follow-up.    Menorrhagia   seen by [facility] team, continue prior to hospital medications unchanged.  Outpatient follow-up with her OB physician within a week of discharge.   Acute blood loss anemia kindly see #1 above  pt started on oral iron by hematology, continue upon discharge.   Anxiety disorder  on atarax 25 mg tid prn, started prior to admission continue.   Chronic palpitations and sinus tachycardia according to the patient secondary to anxiety for several years.  Stable TSH and free T4, echo stable, placed  on low-dose beta-blocker along with Atarax for anxiety which was started prior to this hospital admission.  With these 2 medications tachycardia is better.  Outpatient follow-up with PCP, may benefit from outpatient psych follow-up for anxiety.    Discharge diagnosis     Principal Problem:   Thrombocytopenia (HCC) Active Problems:   Anemia   Menorrhagia   Anxiety disorder    Discharge instructions    Discharge Instructions     Diet - low sodium heart healthy   Complete by: As directed    Discharge instructions   Complete by: As directed    Follow with Primary MD Nelwyn Salisbury, MD in 7 days   Get CBC, CMP, anemia panel-  checked next visit with your primary MD   Activity: As tolerated with Full fall precautions use walker/cane & assistance as needed  Disposition Home    Diet: Heart Healthy   Special Instructions: If you have smoked or chewed Tobacco  in the last 2 yrs please stop smoking, stop any regular Alcohol  and or any  Recreational drug use.  On your next visit with your primary care physician please Get Medicines reviewed and adjusted.  Please request your Prim.MD to go over all Hospital Tests and Procedure/Radiological results at the follow up, please get all Hospital records sent to your Prim MD by signing hospital release before you go home.  If you experience worsening of your admission symptoms, develop shortness of breath, life threatening emergency, suicidal or homicidal thoughts you must seek medical attention immediately by calling 911 or calling your MD immediately  if symptoms less severe.  You Must read complete instructions/literature along with all the possible adverse reactions/side effects for all the Medicines you take and that have been prescribed to you. Take any new Medicines after you have completely understood and accpet all the possible adverse reactions/side effects.   Do not drive when taking Pain medications.  Do not take more than prescribed Pain, Sleep and Anxiety Medications   Increase activity slowly   Complete by: As directed        Discharge Medications   Allergies as of 12/19/2022   No Known Allergies      Medication List     STOP taking these medications    escitalopram 10 MG tablet Commonly known as: Lexapro   One-A-Day Womens Prenatal 1 28-0.8-235 MG Caps   valACYclovir 500 MG tablet Commonly known as: Valtrex       TAKE these medications    acetaminophen 325 MG tablet Commonly known as: TYLENOL Take 2 tablets (650 mg total) by mouth every 4 (four) hours as needed for up to 30 doses for mild pain.   ferrous sulfate 325 (65 FE) MG tablet Take 1 tablet (325 mg total) by mouth 2 (two) times daily with a meal. What changed: when to take this   folic acid 1 MG tablet Commonly known as: FOLVITE Take 1 tablet (1 mg total) by mouth daily.   hydrOXYzine 25 MG tablet Commonly known as: ATARAX Take 1 tablet (25 mg total) by mouth 3 (three) times daily  as needed for anxiety.   metoprolol tartrate 25 MG tablet Commonly known as: LOPRESSOR Take 1 tablet (25 mg total) by mouth 2 (two) times daily.   Nexplanon 68 MG Impl implant Generic drug: etonogestrel 68 mg by Subdermal route once.   norethindrone-ethinyl estradiol 1-20 MG-MCG tablet Commonly known as: LOESTRIN Take 1 tablet by mouth daily.  Follow-up Information     Nelwyn Salisbury, MD. Schedule an appointment as soon as possible for a visit in 1 week(s).   Specialty: Family Medicine Why: With your [facility] physician within a week of discharge. Contact information: [address] [place] [address] [phone]         Lyn Henri, MD. Schedule an appointment as soon as possible for a visit in 1 week(s).   Specialty: Obstetrics and Gynecology Contact information: [address] [place] [address] [phone]         Serena Croissant, MD. Schedule an appointment as soon as possible for a visit in 1 week(s).   Specialty: Hematology and Oncology Contact information: [address] [place] [address] [phone]                 Major procedures and Radiology Reports - PLEASE review detailed and final reports thoroughly  -       ECHOCARDIOGRAM COMPLETE  Result Date: 12/19/2022    ECHOCARDIOGRAM REPORT   Patient Name:   JENNYE BLEYER Date of Exam: 12/19/2022 Medical Rec #:  [id number]      Height:       62.0 in Accession #:    [id number]     Weight:       143.3 lb Date of Birth:  [birth year]       BSA:          1.659 m Patient Age:    37 years       BP:           106/76 mmHg Patient Gender: F              HR:           91 bpm. Exam Location:  Inpatient Procedure: 2D Echo, Cardiac Doppler and Color Doppler Indications:     CHF  History:         Patient has prior history of Echocardiogram examinations, most                  recent 07/27/2018.  Sonographer:     Darlys Gales Referring Phys:  [id number] [facility] Diagnosing  Phys: Charlton Haws MD IMPRESSIONS  1. Left ventricular ejection fraction, by estimation, is 55 to 60%. The left ventricle has normal function. The left ventricle has no regional wall motion abnormalities. Left ventricular diastolic parameters were normal.  2. Right ventricular systolic function is normal. The right ventricular size is normal.  3. The mitral valve is abnormal. Mild mitral valve regurgitation. No evidence of mitral stenosis.  4. The aortic valve is tricuspid. There is mild calcification of the aortic valve. Aortic valve regurgitation is mild. Aortic valve sclerosis is present, with no evidence of aortic valve stenosis.  5. The inferior vena cava is normal in size with greater than 50% respiratory variability, suggesting right atrial pressure of 3 mmHg. FINDINGS  Left Ventricle: Left ventricular ejection fraction, by estimation, is 55 to 60%. The left ventricle has normal function. The left ventricle has no regional wall motion abnormalities. The left ventricular internal cavity size was normal in size. There is  no left ventricular hypertrophy. Left ventricular diastolic parameters were normal. Right Ventricle: The right ventricular size is normal. No increase in right ventricular wall thickness. Right ventricular systolic function is normal. Left Atrium: Left atrial size was normal in size. Right Atrium: Right atrial size was normal in size. Pericardium: There is no evidence of pericardial effusion. Mitral  Valve: The mitral valve is abnormal. There is mild thickening of the mitral valve leaflet(s). Mild mitral valve regurgitation. No evidence of mitral valve stenosis. Tricuspid Valve: The tricuspid valve is normal in structure. Tricuspid valve regurgitation is not demonstrated. No evidence of tricuspid stenosis. Aortic Valve: The aortic valve is tricuspid. There is mild calcification of the aortic valve. Aortic valve regurgitation is mild. Aortic regurgitation PHT measures 523 msec. Aortic valve  sclerosis is present, with no evidence of aortic valve stenosis. Aortic valve mean gradient measures 3.0 mmHg. Aortic valve peak gradient measures 5.8 mmHg. Aortic valve area, by VTI measures 2.48 cm. Pulmonic Valve: The pulmonic valve was normal in structure. Pulmonic valve regurgitation is trivial. No evidence of pulmonic stenosis. Aorta: The aortic root is normal in size and structure. Venous: The inferior vena cava is normal in size with greater than 50% respiratory variability, suggesting right atrial pressure of 3 mmHg. IAS/Shunts: No atrial level shunt detected by color flow Doppler.  LEFT VENTRICLE PLAX 2D LVIDd:         4.20 cm   Diastology LVIDs:         2.20 cm   LV e' medial:    8.92 cm/s LV PW:         0.90 cm   LV E/e' medial:  9.9 LV IVS:        0.60 cm   LV e' lateral:   12.40 cm/s LVOT diam:     1.90 cm   LV E/e' lateral: 7.1 LV SV:         52 LV SV Index:   31 LVOT Area:     2.84 cm  RIGHT VENTRICLE RV S prime:     12.40 cm/s TAPSE (M-mode): 2.4 cm LEFT ATRIUM             Index        RIGHT ATRIUM           Index LA Vol (A2C):   24.9 ml 15.01 ml/m  RA Area:     10.30 cm LA Vol (A4C):   21.3 ml 12.84 ml/m  RA Volume:   22.40 ml  13.50 ml/m LA Biplane Vol: 23.6 ml 14.22 ml/m  AORTIC VALVE AV Area (Vmax):    2.41 cm AV Area (Vmean):   2.47 cm AV Area (VTI):     2.48 cm AV Vmax:           120.00 cm/s AV Vmean:          86.100 cm/s AV VTI:            0.208 m AV Peak Grad:      5.8 mmHg AV Mean Grad:      3.0 mmHg LVOT Vmax:         102.00 cm/s LVOT Vmean:        75.100 cm/s LVOT VTI:          0.182 m LVOT/AV VTI ratio: 0.87 AI PHT:            523 msec  AORTA Ao Root diam: 3.10 cm MITRAL VALVE MV Area (PHT): 5.34 cm    SHUNTS MV Decel Time: 142 msec    Systemic VTI:  0.18 m MV E velocity: 88.50 cm/s  Systemic Diam: 1.90 cm MV A velocity: 91.60 cm/s MV E/A ratio:  0.97 Charlton Haws MD Electronically signed by Charlton Haws MD Signature Date/Time: 12/19/2022/11:26:22 AM    Final    CT HEAD WO  CONTRAST (  )  Result Date: 12/15/2022 CLINICAL DATA:  Severe thrombocytopenia with headache, concern for intracranial bleeding EXAM: CT HEAD WITHOUT CONTRAST TECHNIQUE: Contiguous axial images were obtained from the base of the skull through the vertex without intravenous contrast. RADIATION DOSE REDUCTION: This exam was performed according to the departmental dose-optimization program which includes automated exposure control, adjustment of the mA and/or kV according to patient size and/or use of iterative reconstruction technique. COMPARISON:  None Available. FINDINGS: Brain: No evidence of acute infarction, hemorrhage, mass, mass effect, or midline shift. No hydrocephalus or extra-axial fluid collection. 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 Vascular: No hyperdense vessel. Skull: Negative for fracture or focal lesion. Sinuses/Orbits: Mucosal thickening in the right sphenoid sinus. Otherwise clear paranasal sinuses. No acute finding in the orbits. Other: The mastoid air cells are well aerated. IMPRESSION: 1. No acute intracranial process. 2. Dysgenesis of the corpus callosum. Electronically Signed   By: Wiliam Ke M.D.   On: 12/15/2022 19:58    Micro Results    No results found for this or any previous visit (from the past 240 hour(s)).  Today   Subjective    Charolett Barthell today has no headache,no chest abdominal pain,no new weakness tingling or numbness, feels much better wants to go home today.    Objective   Blood pressure 123/65, pulse 89, temperature 98.3 F (36.8 C), temperature source Oral, resp. rate 20, height 5\' 2"  (1.575 m), weight 65 kg, last menstrual period 12/05/2022, SpO2 100%.  No intake or output data in the 24 hours ending 12/19/22 1215  Exam  Awake Alert, No new F.N deficits,    [place].AT,PERRAL Supple Neck,   Symmetrical Chest wall movement, Good air movement bilaterally, CTAB RRR,No Gallops,   +ve  B.Sounds, Abd Soft, Non tender,  No Cyanosis, Clubbing or edema    Data Review   Recent Labs  Lab 12/17/22 0332 12/17/22 1234 12/17/22 1803 12/18/22 0602 12/19/22 0757  WBC 13.1* 17.2* 17.1* 19.5* 19.7*  HGB 6.6* 8.4* 8.1* 8.3* 9.0*  HCT 20.1* 25.6* 25.0* 25.9* 28.4*  PLT 109* 110* 119* 124* 134*  MCV 95.3 89.2 89.3 89.9 92.8  MCH 31.3 29.3 28.9 28.8 29.4  MCHC 32.8 32.8 32.4 32.0 31.7  RDW 13.5 17.9* 18.6* 18.3* 18.6*  LYMPHSABS 1.5  --   --  3.0 3.5  MONOABS 0.4  --   --  1.3* 1.5*  EOSABS 0.0  --   --  0.0 0.0  BASOSABS 0.0  --   --  0.0 0.0    Recent Labs  Lab 12/15/22 1309 12/16/22 0338 12/17/22 0332 12/18/22 0602 12/19/22 0757  NA 134* 137 140 139 137  K 3.6 3.8 4.1 3.8 3.5  CL 108 109 111 109 108  CO2 20* 21* 22 21* 21*  ANIONGAP 6 7 7 9 8   GLUCOSE 91 131* 133* 109* 111*  BUN 10 12 13 14 13   CREATININE 0.74 0.84 0.82 0.78 0.86  AST 15 14*  --   --   --   ALT 12 15  --   --   --   ALKPHOS 35* 35*  --   --   --   BILITOT 0.6 0.5  --   --   --   ALBUMIN 3.1* 3.2*  --   --   --   TSH  --   --   --  0.786  --   MG  --   --   --  2.2  --   CALCIUM 8.2* 8.7* 8.5* 8.7* 8.7*    Total Time in preparing paper work, data evaluation and todays exam - 35 minutes  Signature  -    Susa Raring M.D on 12/19/2022 at 12:15 PM   -  To page go to [email or web address]

## 2022-12-19 NOTE — Progress Notes (Signed)
Mobility Specialist Progress Note:   12/19/22 0824  Mobility  Activity Ambulated independently in hallway  Level of Assistance Independent after set-up  Assistive Device None  Distance Ambulated (ft) 400 ft  Activity Response Tolerated well  Mobility Referral Yes  Mobility visit 1 Mobility  Mobility Specialist Start Time (ACUTE ONLY) [id number]  Mobility Specialist Stop Time (ACUTE ONLY) [id number]  Mobility Specialist Time Calculation (min) (ACUTE ONLY) 8 min   Pt received in bed, eager for mobility. Ambulated in hallway independently after set-up. Tolerated well, asx throughout, max HR 108 bpm. Pt received in hallway for testing, left pt in care of transport.  Feliciana Rossetti Mobility Specialist Please contact via Special educational needs teacher or  Rehab office at [phone]

## 2022-12-19 NOTE — Plan of Care (Signed)
  Problem: Education: Goal: Knowledge of General Education information will improve Description: Including pain rating scale, medication(s)/side effects and non-pharmacologic comfort measures Outcome: Progressing   Problem: Health Behavior/Discharge Planning: Goal: Ability to manage health-related needs will improve Outcome: Progressing   Problem: Clinical Measurements: Goal: Ability to maintain clinical measurements within normal limits will improve Outcome: Progressing Goal: Will remain free from infection Outcome: Progressing   Problem: Coping: Goal: Level of anxiety will decrease Outcome: Progressing   Problem: Safety: Goal: Ability to remain free from injury will improve Outcome: Progressing

## 2022-12-19 NOTE — Discharge Instructions (Signed)
Follow with Primary MD Nelwyn Salisbury, MD in 7 days   Get CBC, CMP, anemia panel-  checked next visit with your primary MD   Activity: As tolerated with Full fall precautions use walker/cane & assistance as needed  Disposition Home    Diet: Heart Healthy   Special Instructions: If you have smoked or chewed Tobacco  in the last 2 yrs please stop smoking, stop any regular Alcohol  and or any Recreational drug use.  On your next visit with your primary care physician please Get Medicines reviewed and adjusted.  Please request your Prim.MD to go over all Hospital Tests and Procedure/Radiological results at the follow up, please get all Hospital records sent to your Prim MD by signing hospital release before you go home.  If you experience worsening of your admission symptoms, develop shortness of breath, life threatening emergency, suicidal or homicidal thoughts you must seek medical attention immediately by calling 911 or calling your MD immediately  if symptoms less severe.  You Must read complete instructions/literature along with all the possible adverse reactions/side effects for all the Medicines you take and that have been prescribed to you. Take any new Medicines after you have completely understood and accpet all the possible adverse reactions/side effects.   Do not drive when taking Pain medications.  Do not take more than prescribed Pain, Sleep and Anxiety Medications

## 2022-12-19 NOTE — Plan of Care (Signed)
                                      [facility]                            [address] [place], [place] [address]      Melinda [name] was admitted to the Hospital on 12/15/2022 and Discharged  12/19/2022 and should be excused from work/school   for 7  days starting from date -  12/15/2022 , may return to work/school without any restrictions.  Call Susa Raring MD, [facility]  [phone] with questions.  Susa Raring M.D on 12/19/2022,at 12:14 PM  [facility]   Office  [phone]

## 2022-12-20 ENCOUNTER — Telehealth: Payer: Self-pay

## 2022-12-20 NOTE — Transitions of Care (Post Inpatient/ED Visit) (Signed)
   12/20/2022  Name: Melinda [name] MRN: [id number] DOB: [birth year]  Today's TOC FU Call Status: Today's TOC FU Call Status:: Successful TOC FU Call Completed TOC FU Call Complete Date: 12/20/22 Patient's Name and Date of Birth confirmed.  Transition Care Management Follow-up Telephone Call Date of Discharge: 12/19/22 Discharge Facility: [facility]) Type of Discharge: Inpatient Admission Primary Inpatient Discharge Diagnosis:: thrombcytopenia How have you been since you were released from the hospital?: Better Any questions or concerns?: No  Items Reviewed: Did you receive and understand the discharge instructions provided?: Yes Medications obtained,verified, and reconciled?: Yes (Medications Reviewed) Any new allergies since your discharge?: No Dietary orders reviewed?: Yes Do you have support at home?: Yes People in Home: spouse, significant other  Medications Reviewed Today: Medications Reviewed Today     Reviewed by Karena Addison, LPN (Licensed Practical Nurse) on 12/20/22 at [phone]  Med List Status: <None>   Medication Order Taking? Sig Documenting Provider Last Dose Status Informant  acetaminophen (TYLENOL) 325 MG tablet [id number] No Take 2 tablets (650 mg total) by mouth every 4 (four) hours as needed for up to 30 doses for mild pain. Nugent, Odie Sera, NP 12/14/2022 Active Self  etonogestrel (NEXPLANON) 68 MG IMPL implant [id number]  68 mg by Subdermal route once. [provider]  Active Self           Med Note Merlene Morse, Kathreen Cosier   Wed Dec 15, 2022 11:26 AM) Still have  ferrous sulfate 325 (65 FE) MG tablet [id number]  Take 1 tablet (325 mg total) by mouth 2 (two) times daily with a meal. Leroy Sea, MD  Active   folic acid (FOLVITE) 1 MG tablet [id number]  Take 1 tablet (1 mg total) by mouth daily. Leroy Sea, MD  Active   hydrOXYzine (ATARAX) 25 MG tablet [id number]  Take 1 tablet (25 mg total) by mouth 3 (three) times daily as needed for anxiety.  Leroy Sea, MD  Active   metoprolol tartrate (LOPRESSOR) 25 MG tablet [id number]  Take 1 tablet (25 mg total) by mouth 2 (two) times daily. Leroy Sea, MD  Active   norethindrone-ethinyl estradiol (LOESTRIN) 1-20 MG-MCG tablet [id number]  Take 1 tablet by mouth daily. [provider]  Active Self           Med Note Penni Bombard   Wed Dec 15, 2022 11:25 AM) Not picked up yet            Home Care and Equipment/Supplies: Were Home Health Services Ordered?: NA Any new equipment or medical supplies ordered?: NA  Functional Questionnaire: Do you need assistance with bathing/showering or dressing?: No Do you need assistance with meal preparation?: No Do you need assistance with eating?: No Do you have difficulty maintaining continence: No Do you need assistance with getting out of bed/getting out of a chair/moving?: No Do you have difficulty managing or taking your medications?: No  Follow up appointments reviewed: PCP Follow-up appointment confirmed?: Yes Date of PCP follow-up appointment?: 12/22/22 Follow-up Provider: [facility] Follow-up appointment confirmed?: Yes Date of Specialist follow-up appointment?: 12/22/22 Follow-Up Specialty Provider:: OBGYN Do you need transportation to your follow-up appointment?: No Do you understand care options if your condition(s) worsen?: Yes-patient verbalized understanding    SIGNATURE Karena Addison, LPN [facility] Nurse Health Advisor Direct Dial [phone]

## 2022-12-22 ENCOUNTER — Encounter: Payer: Self-pay | Admitting: Family Medicine

## 2022-12-22 ENCOUNTER — Ambulatory Visit: Payer: BC Managed Care – PPO | Admitting: Family Medicine

## 2022-12-22 VITALS — BP 90/72 | HR 100 | Temp 98.9°F | Wt 142.8 lb

## 2022-12-22 DIAGNOSIS — F419 Anxiety disorder, unspecified: Secondary | ICD-10-CM

## 2022-12-22 DIAGNOSIS — D696 Thrombocytopenia, unspecified: Secondary | ICD-10-CM

## 2022-12-22 DIAGNOSIS — R000 Tachycardia, unspecified: Secondary | ICD-10-CM

## 2022-12-22 DIAGNOSIS — D509 Iron deficiency anemia, unspecified: Secondary | ICD-10-CM

## 2022-12-22 LAB — CBC WITH DIFFERENTIAL/PLATELET
Basophils Absolute: 0 10*3/uL (ref 0.0–0.1)
Basophils Relative: 0.2 % (ref 0.0–3.0)
Eosinophils Absolute: 0.1 10*3/uL (ref 0.0–0.7)
Eosinophils Relative: 1.6 % (ref 0.0–5.0)
HCT: 33.3 % — ABNORMAL LOW (ref 36.0–46.0)
Hemoglobin: 10.9 g/dL — ABNORMAL LOW (ref 12.0–15.0)
Lymphocytes Relative: 28 % (ref 12.0–46.0)
Lymphs Abs: 2.4 10*3/uL (ref 0.7–4.0)
MCHC: 32.7 g/dL (ref 30.0–36.0)
MCV: 93 fL (ref 78.0–100.0)
Monocytes Absolute: 0.7 10*3/uL (ref 0.1–1.0)
Monocytes Relative: 7.6 % (ref 3.0–12.0)
Neutro Abs: 5.4 10*3/uL (ref 1.4–7.7)
Neutrophils Relative %: 62.6 % (ref 43.0–77.0)
Platelets: 129 10*3/uL — ABNORMAL LOW (ref 150.0–400.0)
RBC: 3.58 Mil/uL — ABNORMAL LOW (ref 3.87–5.11)
RDW: 18.3 % — ABNORMAL HIGH (ref 11.5–15.5)
WBC: 8.6 10*3/uL (ref 4.0–10.5)

## 2022-12-22 LAB — IBC + FERRITIN
Ferritin: 37.9 ng/mL (ref 10.0–291.0)
Iron: 201 ug/dL — ABNORMAL HIGH (ref 42–145)
Saturation Ratios: 78.9 % — ABNORMAL HIGH (ref 20.0–50.0)
TIBC: 254.8 ug/dL (ref 250.0–450.0)
Transferrin: 182 mg/dL — ABNORMAL LOW (ref 212.0–360.0)

## 2022-12-22 LAB — VITAMIN B12: Vitamin B-12: 800 pg/mL (ref 211–911)

## 2022-12-22 LAB — FOLATE: Folate: >24.2 ng/mL (ref >5.9)

## 2022-12-22 MED ORDER — FOLIC ACID 1 MG PO TABS: 1.0000 mg | ORAL_TABLET | Freq: Every day | ORAL | 0 refills | Status: AC

## 2022-12-22 NOTE — Progress Notes (Signed)
   Subjective:    Patient ID: Melinda [name], female    DOB: [birth year], 37 y.o.   MRN: [id number]  HPI Here with her mother to follow up a hospital stay from 12-15-22 to 12-19-22 for generalized weakness and rapid heart rate. She has been having very heavy menses for the past year, and she has been working with her GYN, Dr. Burnadette Peter, on this. Her exams and pelvic US have been normal so far. She had Nexplanon implanted in April, but now Dr. Rivka Barbara plans to remove this next week. On admission her HGB was 8.9. This dropped to 6.6 after she got IV fluids. She was transfused 2 units of PRBC, and this was up to 9.0 on DC. Her platelets on admission were <5, so she was treated with IV Prednisone. These were up to 134 by DC. Her WBC was 4.7 on admission, and this was up to 19.7 on DC, presumably from the effects of the Prednisone. Her iron was 85, and the ferritin was 56. She was noted to have a high heart rate in the hsopital, and this was felt to be due to anxiety in part. A non-contrasted head CT was normal, as was an ECHO. She was prescribed Metoprolol tartrate and Hydroxyzine on DC, but she never started these because she was nervous about taking them. She is taking ferrous sulfate 325 mg BID and folate 1 mg daily. Today she feel very tired but has no other complaints.    Review of Systems  Constitutional:  Positive for fatigue.  Respiratory: Negative.    Cardiovascular: Negative.   Gastrointestinal: Negative.   Genitourinary: Negative.        Objective:   Physical Exam Constitutional:      General: She is not in acute distress.    Appearance: Normal appearance.  Cardiovascular:     Rate and Rhythm: Regular rhythm. Tachycardia present.     Pulses: Normal pulses.     Heart sounds: Normal heart sounds.  Pulmonary:     Effort: Pulmonary effort is normal.     Breath sounds: Normal breath sounds.  Neurological:     General: No focal deficit present.     Mental Status: She is alert and  oriented to person, place, and time.  Psychiatric:        Mood and Affect: Mood normal.        Behavior: Behavior normal.        Thought Content: Thought content normal.           Assessment & Plan:  She is being treated for iron deficiency anemia and thrombocytopenia. We will recheck a CBC today along with iron, ferritin, folate, and B12. She will follow up with Dr. Jeanie Sewer in Hematology on 01-04-23. As far as the tachycardia, we agreed to watch this for now. She admits to drinking a lot of [facility] and coffee prior to this hospitalization, but she has stopped consuming any caffeine drinks. We spent a total of (35   ) minutes reviewing records and discussing these issues.  Gershon Crane, MD

## 2023-01-04 ENCOUNTER — Inpatient Hospital Stay: Payer: BC Managed Care – PPO | Attending: Hematology and Oncology

## 2023-01-04 ENCOUNTER — Inpatient Hospital Stay (HOSPITAL_BASED_OUTPATIENT_CLINIC_OR_DEPARTMENT_OTHER): Payer: BC Managed Care – PPO | Admitting: Hematology and Oncology

## 2023-01-04 VITALS — BP 125/75 | HR 90 | Temp 98.4°F | Resp 16 | Wt 146.3 lb

## 2023-01-04 DIAGNOSIS — D693 Immune thrombocytopenic purpura: Secondary | ICD-10-CM | POA: Insufficient documentation

## 2023-01-04 DIAGNOSIS — D500 Iron deficiency anemia secondary to blood loss (chronic): Secondary | ICD-10-CM | POA: Insufficient documentation

## 2023-01-04 DIAGNOSIS — N920 Excessive and frequent menstruation with regular cycle: Secondary | ICD-10-CM | POA: Insufficient documentation

## 2023-01-04 DIAGNOSIS — D696 Thrombocytopenia, unspecified: Secondary | ICD-10-CM | POA: Diagnosis not present

## 2023-01-04 LAB — CMP (CANCER CENTER ONLY)
ALT: 23 U/L (ref 0–44)
AST: 13 U/L — ABNORMAL LOW (ref 15–41)
Albumin: 4 g/dL (ref 3.5–5.0)
Alkaline Phosphatase: 53 U/L (ref 38–126)
Anion gap: 5 (ref 5–15)
BUN: 10 mg/dL (ref 6–20)
CO2: 25 mmol/L (ref 22–32)
Calcium: 9 mg/dL (ref 8.9–10.3)
Chloride: 111 mmol/L (ref 98–111)
Creatinine: 0.89 mg/dL (ref 0.44–1.00)
GFR, Estimated: >60 mL/min (ref >60)
Glucose, Bld: 64 mg/dL — ABNORMAL LOW (ref 70–99)
Potassium: 4 mmol/L (ref 3.5–5.1)
Sodium: 141 mmol/L (ref 135–145)
Total Bilirubin: 0.5 mg/dL (ref <1.2)
Total Protein: 6.8 g/dL (ref 6.5–8.1)

## 2023-01-04 LAB — CBC WITH DIFFERENTIAL (CANCER CENTER ONLY)
Abs Immature Granulocytes: 0.05 10*3/uL (ref 0.00–0.07)
Basophils Absolute: 0 10*3/uL (ref 0.0–0.1)
Basophils Relative: 1 %
Eosinophils Absolute: 0.1 10*3/uL (ref 0.0–0.5)
Eosinophils Relative: 2 %
HCT: 38.5 % (ref 36.0–46.0)
Hemoglobin: 12.6 g/dL (ref 12.0–15.0)
Immature Granulocytes: 1 %
Lymphocytes Relative: 31 %
Lymphs Abs: 1.3 10*3/uL (ref 0.7–4.0)
MCH: 31.1 pg (ref 26.0–34.0)
MCHC: 32.7 g/dL (ref 30.0–36.0)
MCV: 95.1 fL (ref 80.0–100.0)
Monocytes Absolute: 0.3 10*3/uL (ref 0.1–1.0)
Monocytes Relative: 6 %
Neutro Abs: 2.5 10*3/uL (ref 1.7–7.7)
Neutrophils Relative %: 59 %
Platelet Count: 72 10*3/uL — ABNORMAL LOW (ref 150–400)
RBC: 4.05 MIL/uL (ref 3.87–5.11)
RDW: 16.6 % — ABNORMAL HIGH (ref 11.5–15.5)
WBC Count: 4.1 10*3/uL (ref 4.0–10.5)
nRBC: 0 % (ref 0.0–0.2)

## 2023-01-04 LAB — FERRITIN: Ferritin: 31 ng/mL (ref 11–307)

## 2023-01-04 LAB — IRON AND IRON BINDING CAPACITY (CC-WL,HP ONLY)
Iron: 100 ug/dL (ref 28–170)
Saturation Ratios: 32 % — ABNORMAL HIGH (ref 10.4–31.8)
TIBC: 312 ug/dL (ref 250–450)
UIBC: 212 ug/dL (ref 148–442)

## 2023-01-04 LAB — RETIC PANEL
Immature Retic Fract: 12.4 % (ref 2.3–15.9)
RBC.: 4.06 MIL/uL (ref 3.87–5.11)
Retic Count, Absolute: 134.4 10*3/uL (ref 19.0–186.0)
Retic Ct Pct: 3.3 % — ABNORMAL HIGH (ref 0.4–3.1)
Reticulocyte Hemoglobin: 34.8 pg (ref >27.9)

## 2023-01-04 NOTE — Progress Notes (Signed)
[facility] Telephone:([phone]   Fax:([phone]) [phone]  PROGRESS NOTE  Patient Care Team: Nelwyn Salisbury, MD as PCP - General Leonides Schanz Thereasa Distance, MD as Consulting Physician (Hematology and Oncology)  Hematological History:  #Iron deficiency anemia: -Patient was seen by [facility] Hematology team from 2014-2020 with Dr. Gaylyn Rong and then Dr. Bertis Ruddy.  -07/27/2012: Received IV feraheme 1020 mg dose x 1. Developed hives several days later.  -Currently on oral iron 325 mg once daily -01/30/2021: Received IV monoferric 1000 mg x 1.   #Thrombocytopenia, likely ITP: ---Received steroid therapy during her pregnancy in 2020.    HISTORY OF PRESENTING ILLNESS:  Melinda [name] 37 y.o. female returns for follow-up for iron deficiency anemia and thrombocytopenia, likely ITP.  Patient was last seen in clinic on 05/26/2022.  In the interim she has continued on her p.o. iron therapy and had a hospitalization for severe thrombocytopenia/flare of her ITP on 12/15/2022.  On exam today Mrs. Chapo reports she has been well overall in the interim since our last visit while she was in the hospital.  She reports her menstrual bleeding is under better control and she has had no bleeding since then.  She was started on a birth control pill and her Nexplanon was removed.  She reports that she continues taking her iron pills which is not causing any stomach upset but some occasional constipation.  She reports taking it with orange juice does help with this.  She has not had any overt signs of bleeding, bruising, or dark stools.  She does have some occasional gum bleeding.  She reports that she is not having any nosebleeds.  She reports her energy levels are good and her appetite is strong.  Overall she feels well and has no questions concerns or complaints today.  She is willing and able to continue on p.o. iron therapy.  She is not having any ice cravings.  She denies fevers, chills, night sweats, shortness of breath,  chest pain or cough.  She has no other complaints.  Rest of the 10 point ROS is below.  MEDICAL HISTORY:  Past Medical History:  Diagnosis Date   Anemia    Chickenpox    CHICKENPOX, HX OF 04/10/2007   Qualifier: Diagnosis of   By: Linna Darner, CMA, Cindy      Replacing diagnoses that were inactivated after the 04/12/22 regulatory import     Early satiety    Herpes genitalia    Herpes simplex infection in mother during third trimester of pregnancy 08/31/2018   Taking Valtrex 500 mg BID (started @ 36 wks)     HPV (human papilloma virus) anogenital infection    Iron deficiency anemia    Night sweat    Scarring, keloid    external genitalia   Thrombocytopenia (HCC)    Thrombocytopenia affecting pregnancy (HCC) 06/17/2018   Dx'd 2015 by hematologist; plan has included following/obs if remains above 50     Vaginal Pap smear, abnormal    Weight loss, non-intentional     SURGICAL HISTORY: Past Surgical History:  Procedure Laterality Date   CESAREAN SECTION N/A 09/20/2018   Procedure: CESAREAN SECTION;  Surgeon: Kathrynn Running, MD;  Location: [facility] LD ORS;  Service: Obstetrics;  Laterality: N/A;   WISDOM TOOTH EXTRACTION      SOCIAL HISTORY: Social History   Socioeconomic History   Marital status: Single    Spouse name: Not on file   Number of children: 0   Years of education: Not  on file   Highest education level: Master's degree (e.g., MA, MS, MEng, MEd, MSW, MBA)  Occupational History    Employer: [employer]    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[employer]; Runner, broadcasting/film/video  Tobacco Use   Smoking status: Never   Smokeless tobacco: Never  Vaping Use   Vaping status: Never Used  Substance and Sexual Activity   Alcohol use: No    Alcohol/week: 0.0 standard drinks of alcohol   Drug use: No   Sexual activity: Not Currently    Birth control/protection: None  Other Topics Concern   Not on file  Social History Narrative   Not on file   Social Drivers of Health   Financial Resource  Strain: Patient Declined (03/10/2022)   Overall Financial Resource Strain (CARDIA)    Difficulty of Paying Living Expenses: Patient declined  Food Insecurity: Patient Declined (12/16/2022)   Hunger Vital Sign    Worried About Running Out of Food in the Last Year: Patient declined    Ran Out of Food in the Last Year: Patient declined  Transportation Needs: Patient Declined (12/16/2022)   PRAPARE - Administrator, Civil Service (Medical): Patient declined    Lack of Transportation (Non-Medical): Patient declined  Physical Activity: Insufficiently Active (03/10/2022)   Exercise Vital Sign    Days of Exercise per Week: 1 day    Minutes of Exercise per Session: 30 min  Stress: Patient Declined (03/10/2022)   [employer] of Occupational Health - Occupational Stress Questionnaire    Feeling of Stress : Patient declined  Social Connections: Unknown (03/10/2022)   Social Connection and Isolation Panel [NHANES]    Frequency of Communication with Friends and Family: Patient declined    Frequency of Social Gatherings with Friends and Family: Patient declined    Attends Religious Services: Patient declined    Database administrator or Organizations: Patient declined    Attends Banker Meetings: Not on file    Marital Status: Patient declined  Intimate Partner Violence: Not At Risk (12/16/2022)   Humiliation, Afraid, Rape, and Kick questionnaire    Fear of Current or Ex-Partner: No    Emotionally Abused: No    Physically Abused: No    Sexually Abused: No    FAMILY HISTORY: Family History  Problem Relation Age of Onset   Hypertension Maternal Grandmother    Diabetes Maternal Grandmother    Hypertension Mother    Other Father        Leukopenia.   Hypertension Maternal Uncle     ALLERGIES:  has no known allergies.  MEDICATIONS:  Current Outpatient Medications  Medication Sig Dispense Refill   acetaminophen (TYLENOL) 325 MG tablet Take 2 tablets (650 mg total)  by mouth every 4 (four) hours as needed for up to 30 doses for mild pain. 30 tablet 1   etonogestrel (NEXPLANON) 68 MG IMPL implant 68 mg by Subdermal route once.     ferrous sulfate 325 (65 FE) MG tablet Take 1 tablet (325 mg total) by mouth 2 (two) times daily with a meal. 30 tablet 2   folic acid (FOLVITE) 1 MG tablet Take 1 tablet (1 mg total) by mouth daily. 90 tablet 0   norethindrone-ethinyl estradiol (LOESTRIN) 1-20 MG-MCG tablet Take 1 tablet by mouth daily.     No current facility-administered medications for this visit.    REVIEW OF SYSTEMS:   Constitutional: ( - ) fevers, ( - )  chills , ( - ) night sweats Eyes: ( - )  blurriness of vision, ( - ) double vision, ( - ) watery eyes Ears, nose, mouth, throat, and face: ( - ) mucositis, ( - ) sore throat Respiratory: ( - ) cough, (- ) dyspnea, ( - ) wheezes Cardiovascular: ( - ) palpitation, ( - ) chest discomfort, ( - ) lower extremity swelling Gastrointestinal:  ( - ) nausea, ( - ) heartburn, ( - ) change in bowel habits Skin: ( - ) abnormal skin rashes Lymphatics: ( - ) new lymphadenopathy, ( - ) easy bruising Neurological: ( - ) numbness, ( - ) tingling, ( - ) new weaknesses Behavioral/Psych: ( - ) mood change, ( - ) new changes  All other systems were reviewed with the patient and are negative.  PHYSICAL EXAMINATION: ECOG PERFORMANCE STATUS: 0 - Asymptomatic  Vitals:   01/04/23 1059  BP: 125/75  Pulse: 90  Resp: 16  Temp: 98.4 F (36.9 C)  SpO2: 100%     Filed Weights   01/04/23 1059  Weight: 146 lb 4.8 oz (66.4 kg)      GENERAL: well appearing female in NAD  SKIN: skin color, texture, turgor are normal, no rashes or significant lesions EYES: conjunctiva are pink and non-injected, sclera clear LUNGS: clear to auscultation and percussion with normal breathing effort HEART: regular rate & rhythm and no murmurs and no lower extremity edema Musculoskeletal: no cyanosis of digits and no clubbing  PSYCH: alert &  oriented x 3, fluent speech NEURO: no focal motor/sensory deficits  LABORATORY DATA:  I have reviewed the data as listed    Latest Ref Rng & Units 01/04/2023   10:32 AM 12/22/2022    2:46 PM 12/19/2022    7:57 AM  CBC  WBC 4.0 - 10.5 K/uL 4.1  8.6  19.7   Hemoglobin 12.0 - 15.0 g/dL 45.4  09.8  9.0   Hematocrit 36.0 - 46.0 % 38.5  33.3  28.4   Platelets 150 - 400 K/uL 72  129.0  134        Latest Ref Rng & Units 01/04/2023   10:32 AM 12/19/2022    7:57 AM 12/18/2022    6:02 AM  CMP  Glucose 70 - 99 mg/dL 64  119  147   BUN 6 - 20 mg/dL 10  13  14    Creatinine 0.44 - 1.00 mg/dL 8.29  5.62  1.30   Sodium 135 - 145 mmol/L 141  137  139   Potassium 3.5 - 5.1 mmol/L 4.0  3.5  3.8   Chloride 98 - 111 mmol/L 111  108  109   CO2 22 - 32 mmol/L 25  21  21    Calcium 8.9 - 10.3 mg/dL 9.0  8.7  8.7   Total Protein 6.5 - 8.1 g/dL 6.8     Total Bilirubin <1.2 mg/dL 0.5     Alkaline Phos 38 - 126 U/L 53     AST 15 - 41 U/L 13     ALT 0 - 44 U/L 23      ASSESSMENT & PLAN Shelese Lowis is a 37 y.o. 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   #Iron deficiency anemia 2/2 heavy menstrual bleeding: --Received IV monoferric 1000 mg x 1 dose on 01/30/2021, last had Venofer in June/July 2024.  --Continue to incorporate iron rich foods into diet. --Currently on birth control and under the care of gynecologist --Labs today show white blood cell 4.1, hemoglobin 12.6,  MCV 95.1, platelets 72. Iron panel show no evidence of deficiency.  --Currently on ferrous sulfate 325 mg once daily. Recommend to continue with a source of vitamin C. --Need to premedicate for future IV iron infusions as patient had mild rash/pruritis with most recent infusion.   #Thrombocytopenia, likely ITP: --Patient received steroids during her pregnancy in 2020 with improvement of platelet counts. Otherwise, she has not received additional therapy --Patient denies signs of  bleeding except for menstrual cycle.  --Workup from 01/20/2021 showed no evidence of additional deficiencies, hepatitis B or C.  Immature platelet fraction was elevated to suggests ITP as underlying etiology. --labs today show Plt 84 --Continue to monitor with strict precautions for bleeding.  Follow up: -- Labs every 3 months with clinic visits every 6 months.  No orders of the defined types were placed in this encounter.   All questions were answered. The patient knows to call the clinic with any problems, questions or concerns.  I have spent a total of 30 minutes minutes of face-to-face and non-face-to-face time, preparing to see the patient, performing a medically appropriate examination, counseling and educating the patient, ordering tests, documenting clinical information in the electronic health record, and care coordination.   Ulysees Barns, MD Department of Hematology/Oncology [facility] Phone: [phone] Pager: [phone] Email: [email or web address]

## 2023-02-08 ENCOUNTER — Encounter: Payer: Self-pay | Admitting: Family Medicine

## 2023-02-08 ENCOUNTER — Ambulatory Visit (INDEPENDENT_AMBULATORY_CARE_PROVIDER_SITE_OTHER): Payer: Medicaid Other | Admitting: Family Medicine

## 2023-02-08 VITALS — BP 120/82 | HR 67 | Temp 99.0°F | Wt 150.2 lb

## 2023-02-08 DIAGNOSIS — D500 Iron deficiency anemia secondary to blood loss (chronic): Secondary | ICD-10-CM | POA: Diagnosis not present

## 2023-02-08 DIAGNOSIS — M25511 Pain in right shoulder: Secondary | ICD-10-CM

## 2023-02-08 DIAGNOSIS — D696 Thrombocytopenia, unspecified: Secondary | ICD-10-CM | POA: Diagnosis not present

## 2023-02-08 NOTE — Progress Notes (Signed)
   Subjective:    Patient ID: Melinda [name], female    DOB: 1985-07-22, 37 y.o.   MRN: [id number]  HPI Here for several issues. First this is to follow up on severe anemia. She had been having heavy menstrual bleeding due to the Nexplanon she had had implanted. This was removed, and she was started on a BCP. Since then her menses has been lighter and she has had no bleeding between menses. She saw Dr. Leonides Schanz on 01-04-23, and her Hgb was up to 12.6 with normal iron and ferritin. Her platelets were still low at 72. She is now taking only one iron pill every day. She feels much better, but she's still has generalized fatigue. She tried to return to work a few weeks ago, but this have been very difficult for her. She has spoken to her supervisor, and they agree that some more time off work would be helpful. The other issue is some pain in the right shoulder that started 3 weeks ago. No hx of trauma. She has has not been taking anything for this because she thought this would worsen her anemia.    Review of Systems  Constitutional:  Positive for fatigue.  Respiratory: Negative.    Cardiovascular: Negative.   Gastrointestinal: Negative.   Musculoskeletal:  Positive for arthralgias.  Neurological: Negative.        Objective:   Physical Exam Constitutional:      Appearance: Normal appearance. She is not ill-appearing.  Cardiovascular:     Rate and Rhythm: Normal rate and regular rhythm.     Pulses: Normal pulses.     Heart sounds: Normal heart sounds.  Pulmonary:     Effort: Pulmonary effort is normal.     Breath sounds: Normal breath sounds.  Musculoskeletal:     Comments: She is mildly tender over the lateral upper left arm. The shoulder has full ROM   Neurological:     General: No focal deficit present.     Mental Status: She is alert and oriented to person, place, and time.           Assessment & Plan:  Her anemia has resolved now that she has stopped having the heavy menses. She  will keep taking the one iron pill daily. She is still fatigued however, so we will write her out of work from today until she returns to work on 03-07-23. Her thrombocytopenia is stable. The right shoulder pain is consistent with a deltoid bursitis. She will rest the arm, apply ice packs, and take Ibuprofen TID. This should resolve over the next few weeks.  Gershon Crane, MD

## 2023-02-10 NOTE — Telephone Encounter (Unsigned)
Copied from CRM [id number]. Topic: General - Other >> Feb 10, 2023  8:55 AM Theodis Sato wrote: Reason for CRM: Cathlean Cower from [employer] schools is re-faxing a FMLA form for that needs to be filled out by Dr. Clent Ridges (Section 2-4) as Cathlean Cower states the patient made mistakes on the form and wrote in the doctors section.

## 2023-03-09 ENCOUNTER — Encounter: Payer: Self-pay | Admitting: Family Medicine

## 2023-03-09 ENCOUNTER — Ambulatory Visit (INDEPENDENT_AMBULATORY_CARE_PROVIDER_SITE_OTHER): Payer: Medicaid Other | Admitting: Family Medicine

## 2023-03-09 VITALS — BP 110/70 | HR 105 | Temp 98.8°F | Ht 62.0 in | Wt 151.4 lb

## 2023-03-09 DIAGNOSIS — Z111 Encounter for screening for respiratory tuberculosis: Secondary | ICD-10-CM | POA: Diagnosis not present

## 2023-03-09 DIAGNOSIS — D709 Neutropenia, unspecified: Secondary | ICD-10-CM

## 2023-03-09 DIAGNOSIS — E042 Nontoxic multinodular goiter: Secondary | ICD-10-CM | POA: Diagnosis not present

## 2023-03-09 DIAGNOSIS — D649 Anemia, unspecified: Secondary | ICD-10-CM | POA: Diagnosis not present

## 2023-03-09 DIAGNOSIS — E785 Hyperlipidemia, unspecified: Secondary | ICD-10-CM

## 2023-03-09 DIAGNOSIS — F419 Anxiety disorder, unspecified: Secondary | ICD-10-CM

## 2023-03-09 DIAGNOSIS — D696 Thrombocytopenia, unspecified: Secondary | ICD-10-CM

## 2023-03-09 DIAGNOSIS — R739 Hyperglycemia, unspecified: Secondary | ICD-10-CM

## 2023-03-09 NOTE — Addendum Note (Signed)
 Addended by: Carola Rhine on: 03/09/2023 04:37 PM   Modules accepted: Orders

## 2023-03-09 NOTE — Progress Notes (Signed)
 Subjective:    Patient ID: Melinda [name], female    DOB: 1986-01-11, 38 y.o.   MRN: [id number]  HPI Here to follow up on issues. She feels great today with no complaints. Her heavy menses stopped after she had the Nexplanon removed and she was started on her BCP. She had no menses the first month, now this week she is having a light menses (which she considers back to her normal baseline). She saw her GYN, Dr. Damaris Hippo, this morning. She has been out of work since 02-08-23 due to the fatigue of anemia, but she feels ready to go back now. Her neutropenia and thrombocytopenia have been stable. Her anxiety is stable.    Review of Systems  Constitutional: Negative.   HENT: Negative.    Eyes: Negative.   Respiratory: Negative.    Cardiovascular: Negative.   Gastrointestinal: Negative.   Genitourinary:  Negative for decreased urine volume, difficulty urinating, dyspareunia, dysuria, enuresis, flank pain, frequency, hematuria, pelvic pain and urgency.  Musculoskeletal: Negative.   Skin: Negative.   Neurological: Negative.  Negative for headaches.  Psychiatric/Behavioral: Negative.         Objective:   Physical Exam Constitutional:      General: She is not in acute distress.    Appearance: Normal appearance. She is well-developed.  HENT:     Head: Normocephalic and atraumatic.     Right Ear: External ear normal.     Left Ear: External ear normal.     Nose: Nose normal.     Mouth/Throat:     Pharynx: No oropharyngeal exudate.  Eyes:     General: No scleral icterus.    Conjunctiva/sclera: Conjunctivae normal.     Pupils: Pupils are equal, round, and reactive to light.  Neck:     Thyroid: No thyromegaly.     Vascular: No JVD.  Cardiovascular:     Rate and Rhythm: Normal rate and regular rhythm.     Pulses: Normal pulses.     Heart sounds: Normal heart sounds. No murmur heard.    No friction rub. No gallop.  Pulmonary:     Effort: Pulmonary effort is normal. No respiratory  distress.     Breath sounds: Normal breath sounds. No wheezing or rales.  Chest:     Chest wall: No tenderness.  Abdominal:     General: Bowel sounds are normal. There is no distension.     Palpations: Abdomen is soft. There is no mass.     Tenderness: There is no abdominal tenderness. There is no guarding or rebound.  Musculoskeletal:        General: No tenderness. Normal range of motion.     Cervical back: Normal range of motion and neck supple.  Lymphadenopathy:     Cervical: No cervical adenopathy.  Skin:    General: Skin is warm and dry.     Findings: No erythema or rash.  Neurological:     General: No focal deficit present.     Mental Status: She is alert and oriented to person, place, and time.     Cranial Nerves: No cranial nerve deficit.     Motor: No abnormal muscle tone.     Coordination: Coordination normal.     Deep Tendon Reflexes: Reflexes are normal and symmetric. Reflexes normal.  Psychiatric:        Mood and Affect: Mood normal.        Behavior: Behavior normal.        Thought Content: Thought  content normal.        Judgment: Judgment normal.           Assessment & Plan:  She has recovered from significant anemia due to menorrhagia, and now her cycles are back to normal on BCP. Her anxiety is well controlled. We will get fasting labs to check lipids, another CBC, etc. She will return to work on 03-14-23. We spent a total of (34   ) minutes reviewing records and discussing these issues.  Gershon Crane, MD

## 2023-03-11 ENCOUNTER — Ambulatory Visit: Payer: Medicaid Other

## 2023-03-11 ENCOUNTER — Other Ambulatory Visit: Payer: Medicaid Other

## 2023-03-11 DIAGNOSIS — R739 Hyperglycemia, unspecified: Secondary | ICD-10-CM

## 2023-03-11 DIAGNOSIS — E785 Hyperlipidemia, unspecified: Secondary | ICD-10-CM

## 2023-03-11 LAB — HEPATIC FUNCTION PANEL
ALT: 36 U/L — ABNORMAL HIGH (ref 0–35)
AST: 20 U/L (ref 0–37)
Albumin: 3.9 g/dL (ref 3.5–5.2)
Alkaline Phosphatase: 44 U/L (ref 39–117)
Bilirubin, Direct: 0.1 mg/dL (ref 0.0–0.3)
Total Bilirubin: 0.5 mg/dL (ref 0.2–1.2)
Total Protein: 6.7 g/dL (ref 6.0–8.3)

## 2023-03-11 LAB — LIPID PANEL
Cholesterol: 153 mg/dL (ref 0–200)
HDL: 45.8 mg/dL (ref >39.00)
LDL Cholesterol: 97 mg/dL (ref 0–99)
NonHDL: 107.47
Total CHOL/HDL Ratio: 3
Triglycerides: 51 mg/dL (ref 0.0–149.0)
VLDL: 10.2 mg/dL (ref 0.0–40.0)

## 2023-03-11 LAB — BASIC METABOLIC PANEL
BUN: 11 mg/dL (ref 6–23)
CO2: 24 meq/L (ref 19–32)
Calcium: 8.5 mg/dL (ref 8.4–10.5)
Chloride: 106 meq/L (ref 96–112)
Creatinine, Ser: 0.86 mg/dL (ref 0.40–1.20)
GFR: 86.52 mL/min (ref >60.00)
Glucose, Bld: 78 mg/dL (ref 70–99)
Potassium: 4 meq/L (ref 3.5–5.1)
Sodium: 137 meq/L (ref 135–145)

## 2023-03-11 LAB — CBC WITH DIFFERENTIAL/PLATELET
Basophils Absolute: 0 10*3/uL (ref 0.0–0.1)
Basophils Relative: 0.5 % (ref 0.0–3.0)
Eosinophils Absolute: 0.1 10*3/uL (ref 0.0–0.7)
Eosinophils Relative: 1.5 % (ref 0.0–5.0)
HCT: 43 % (ref 36.0–46.0)
Hemoglobin: 14.1 g/dL (ref 12.0–15.0)
Lymphocytes Relative: 34.9 % (ref 12.0–46.0)
Lymphs Abs: 1.4 10*3/uL (ref 0.7–4.0)
MCHC: 32.7 g/dL (ref 30.0–36.0)
MCV: 92.2 fl (ref 78.0–100.0)
Monocytes Absolute: 0.2 10*3/uL (ref 0.1–1.0)
Monocytes Relative: 6.1 % (ref 3.0–12.0)
Neutro Abs: 2.2 10*3/uL (ref 1.4–7.7)
Neutrophils Relative %: 57 % (ref 43.0–77.0)
Platelets: 56 10*3/uL — ABNORMAL LOW (ref 150.0–400.0)
RBC: 4.67 Mil/uL (ref 3.87–5.11)
RDW: 13.5 % (ref 11.5–15.5)
WBC: 3.9 10*3/uL — ABNORMAL LOW (ref 4.0–10.5)

## 2023-03-11 LAB — HEMOGLOBIN A1C: Hgb A1c MFr Bld: 5.3 % (ref 4.6–6.5)

## 2023-03-11 LAB — TB SKIN TEST
Induration: 0 mm
TB Skin Test: NEGATIVE

## 2023-03-11 LAB — TSH: TSH: 1.39 u[IU]/mL (ref 0.35–5.50)

## 2023-03-11 NOTE — Progress Notes (Signed)
 PPD Reading Note  PPD read and results entered in EpicCare.  Result: 0 mm induration.  Interpretation: Negative  If test not read within 48-72 hours of initial placement, patient advised to repeat in other arm 1-3 weeks after this test.  Allergic reaction: no

## 2023-03-15 ENCOUNTER — Telehealth: Payer: Self-pay | Admitting: Hematology and Oncology

## 2023-03-16 ENCOUNTER — Inpatient Hospital Stay: Attending: Hematology and Oncology

## 2023-03-16 ENCOUNTER — Other Ambulatory Visit: Payer: Self-pay | Admitting: *Deleted

## 2023-03-16 DIAGNOSIS — N920 Excessive and frequent menstruation with regular cycle: Secondary | ICD-10-CM | POA: Insufficient documentation

## 2023-03-16 DIAGNOSIS — D693 Immune thrombocytopenic purpura: Secondary | ICD-10-CM | POA: Diagnosis present

## 2023-03-16 DIAGNOSIS — Z793 Long term (current) use of hormonal contraceptives: Secondary | ICD-10-CM | POA: Insufficient documentation

## 2023-03-16 DIAGNOSIS — D500 Iron deficiency anemia secondary to blood loss (chronic): Secondary | ICD-10-CM

## 2023-03-16 LAB — FERRITIN: Ferritin: 19 ng/mL (ref 11–307)

## 2023-03-16 LAB — CMP (CANCER CENTER ONLY)
ALT: 34 U/L (ref 0–44)
AST: 17 U/L (ref 15–41)
Albumin: 3.7 g/dL (ref 3.5–5.0)
Alkaline Phosphatase: 42 U/L (ref 38–126)
Anion gap: 3 — ABNORMAL LOW (ref 5–15)
BUN: 11 mg/dL (ref 6–20)
CO2: 25 mmol/L (ref 22–32)
Calcium: 8.1 mg/dL — ABNORMAL LOW (ref 8.9–10.3)
Chloride: 112 mmol/L — ABNORMAL HIGH (ref 98–111)
Creatinine: 0.81 mg/dL (ref 0.44–1.00)
GFR, Estimated: >60 mL/min (ref >60)
Glucose, Bld: 77 mg/dL (ref 70–99)
Potassium: 3.6 mmol/L (ref 3.5–5.1)
Sodium: 140 mmol/L (ref 135–145)
Total Bilirubin: 0.3 mg/dL (ref 0.0–1.2)
Total Protein: 6.1 g/dL — ABNORMAL LOW (ref 6.5–8.1)

## 2023-03-16 LAB — CBC WITH DIFFERENTIAL (CANCER CENTER ONLY)
Abs Immature Granulocytes: 0 10*3/uL (ref 0.00–0.07)
Basophils Absolute: 0 10*3/uL (ref 0.0–0.1)
Basophils Relative: 0 %
Eosinophils Absolute: 0.1 10*3/uL (ref 0.0–0.5)
Eosinophils Relative: 2 %
HCT: 33.4 % — ABNORMAL LOW (ref 36.0–46.0)
Hemoglobin: 11 g/dL — ABNORMAL LOW (ref 12.0–15.0)
Immature Granulocytes: 0 %
Lymphocytes Relative: 38 %
Lymphs Abs: 1.5 10*3/uL (ref 0.7–4.0)
MCH: 30.1 pg (ref 26.0–34.0)
MCHC: 32.9 g/dL (ref 30.0–36.0)
MCV: 91.3 fL (ref 80.0–100.0)
Monocytes Absolute: 0.2 10*3/uL (ref 0.1–1.0)
Monocytes Relative: 5 %
Neutro Abs: 2.2 10*3/uL (ref 1.7–7.7)
Neutrophils Relative %: 55 %
Platelet Count: 73 10*3/uL — ABNORMAL LOW (ref 150–400)
RBC: 3.66 MIL/uL — ABNORMAL LOW (ref 3.87–5.11)
RDW: 12.8 % (ref 11.5–15.5)
WBC Count: 4 10*3/uL (ref 4.0–10.5)
nRBC: 0 % (ref 0.0–0.2)

## 2023-03-16 LAB — IRON AND IRON BINDING CAPACITY (CC-WL,HP ONLY)
Iron: 195 ug/dL — ABNORMAL HIGH (ref 28–170)
Saturation Ratios: 65 % — ABNORMAL HIGH (ref 10.4–31.8)
TIBC: 300 ug/dL (ref 250–450)
UIBC: 105 ug/dL — ABNORMAL LOW (ref 148–442)

## 2023-03-16 LAB — RETICULOCYTES
Immature Retic Fract: 9.3 % (ref 2.3–15.9)
RBC.: 3.65 MIL/uL — ABNORMAL LOW (ref 3.87–5.11)
Retic Count, Absolute: 50.7 10*3/uL (ref 19.0–186.0)
Retic Ct Pct: 1.4 % (ref 0.4–3.1)

## 2023-03-24 ENCOUNTER — Inpatient Hospital Stay (HOSPITAL_BASED_OUTPATIENT_CLINIC_OR_DEPARTMENT_OTHER): Admitting: Hematology and Oncology

## 2023-03-24 VITALS — BP 137/79 | HR 92 | Temp 98.7°F | Resp 16 | Wt 150.8 lb

## 2023-03-24 DIAGNOSIS — D500 Iron deficiency anemia secondary to blood loss (chronic): Secondary | ICD-10-CM

## 2023-03-24 DIAGNOSIS — D696 Thrombocytopenia, unspecified: Secondary | ICD-10-CM | POA: Diagnosis not present

## 2023-03-24 NOTE — Progress Notes (Signed)
 [facility] [phone]   [phone]  PROGRESS NOTE  Patient Care Team: Melinda Salisbury, MD as PCP - General Melinda Schanz Thereasa Distance, MD as Consulting Physician (Hematology and Oncology)  Hematological History:  #Iron deficiency anemia: -Patient was seen by [facility]ology team from 2014-2020 with Dr. Gaylyn [name] and then Dr. Bertis [name].  -07/27/2012: Received IV feraheme 1020 mg dose x 1. Developed hives several days later.  -Currently on oral iron 325 mg once daily -01/30/2021: Received IV monoferric 1000 mg x 1.   #Thrombocytopenia, likely ITP: ---Received steroid therapy during her pregnancy in 2020.    HISTORY OF PRESENTING ILLNESS:  Melinda [name] 38 y.o. female returns for follow-up for iron deficiency anemia and thrombocytopenia, likely ITP.  Patient was last seen in clinic on 01/04/2023.  In the interim she has continued on her p.o. iron therapy.   On exam today Melinda [name] reports she has been doing well overall and interim since her last visit though she has been having trouble with a prolonged menstrual cycle.  She reports her cycle started in February and still ongoing.  She notes that she does have to change a pad, but not as often.  Sometimes it is only daily.  She reports her energy levels are currently good about 8 out of 10.  She does have easy bruising.  She is taking her iron supplementation and doing her best to try to eat iron rich foods.  She reports that she does have an upcoming visit in April 2025 with a primary care provider at which time she plans to discuss other options for better control of her menstrual cycles.  She reports that she does not have any constipation or stomach upset from the iron.  She is not having any bleeding elsewhere such as nosebleeding, gum bleeding, or dark stools.  She reports that she is eating well and not having any ice cravings.  She denies any lightheadedness, dizziness, or shortness of breath, though she does have  headaches frequently.  She reports she has been feeling cold quite often.. She is willing and able to continue on p.o. iron therapy.  She denies fevers, chills, night sweats, shortness of breath, chest pain or cough.  She has no other complaints.  Rest of the 10 point ROS is below.  MEDICAL HISTORY:  Past Medical History:  Diagnosis Date   Anemia    Chickenpox    CHICKENPOX, HX OF 04/10/2007   Qualifier: Diagnosis of   By: Melinda [name], CMA, Melinda [name]      Replacing diagnoses that were inactivated after the 04/12/22 regulatory import     Early satiety    Herpes genitalia    Herpes simplex infection in mother during third trimester of pregnancy 08/31/2018   Taking Valtrex 500 mg BID (started @ 36 wks)     HPV (human papilloma virus) anogenital infection    Iron deficiency anemia    Night sweat    Scarring, keloid    external genitalia   Thrombocytopenia (HCC)    Thrombocytopenia affecting pregnancy (HCC) 06/17/2018   Dx'd 2015 by hematologist; plan has included following/obs if remains above 50     Vaginal Pap smear, abnormal    Weight loss, non-intentional     SURGICAL HISTORY: Past Surgical History:  Procedure Laterality Date   CESAREAN SECTION N/A 09/20/2018   Procedure: CESAREAN SECTION;  Surgeon: Melinda Running, MD;  Location: [facility] LD ORS;  Service: Obstetrics;  Laterality: N/A;   WISDOM TOOTH EXTRACTION  SOCIAL HISTORY: Social History   Socioeconomic History   Marital status: Single    Spouse name: Not on file   Number of children: 0   Years of education: Not on file   Highest education level: Master's degree (e.g., MA, MS, MEng, MEd, MSW, MBA)  Occupational History    Employer: [employer]    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[employer]; Runner, broadcasting/film/video  Tobacco Use   Smoking status: Never   Smokeless tobacco: Never  Vaping Use   Vaping status: Never Used  Substance and Sexual Activity   Alcohol use: No    Alcohol/week: 0.0 standard drinks of alcohol   Drug use: No    Sexual activity: Not Currently    Birth control/protection: None  Other Topics Concern   Not on file  Social History Narrative   Not on file   Social Drivers of Health   Financial Resource Strain: Low Risk  (02/07/2023)   Overall Financial Resource Strain (CARDIA)    Difficulty of Paying Living Expenses: Not very hard  Food Insecurity: No Food Insecurity (02/07/2023)   Hunger Vital Sign    Worried About [name] Out of Food in the Last Year: Never true    Ran Out of Food in the Last Year: Never true  Transportation Needs: No Transportation Needs (02/07/2023)   PRAPARE - Administrator, Civil Service (Medical): No    Lack of Transportation (Non-Medical): No  Physical Activity: Unknown (02/07/2023)   Exercise Vital Sign    Days of Exercise per Week: 0 days    Minutes of Exercise per Session: Not on file  Recent Concern: Physical Activity - Inactive (02/07/2023)   Exercise Vital Sign    Days of Exercise per Week: 0 days    Minutes of Exercise per Session: 30 min  Stress: Stress Concern Present (02/07/2023)   [employer] of Occupational Health - Occupational Stress Questionnaire    Feeling of Stress : Rather much  Social Connections: Socially Isolated (02/07/2023)   Social Connection and Isolation Panel [NHANES]    Frequency of Communication with Friends and Family: Once a week    Frequency of Social Gatherings with Friends and Family: Once a week    Attends Religious Services: More than 4 times per year    Active Member of [employer] or Organizations: No    Attends Banker Meetings: Not on file    Marital Status: Never married  Intimate Partner Violence: Not At Risk (12/16/2022)   Humiliation, Afraid, Rape, and Kick questionnaire    Fear of Current or Ex-Partner: No    Emotionally Abused: No    Physically Abused: No    Sexually Abused: No    FAMILY HISTORY: Family History  Problem Relation Age of Onset   Hypertension Maternal Grandmother    Diabetes  Maternal Grandmother    Hypertension Mother    Other Father        Leukopenia.   Hypertension Maternal Uncle     ALLERGIES:  has no known allergies.  MEDICATIONS:  Current Outpatient Medications  Medication Sig Dispense Refill   acetaminophen (TYLENOL) 325 MG tablet Take 2 tablets (650 mg total) by mouth every 4 (four) hours as needed for up to 30 doses for mild pain. 30 tablet 1   ferrous sulfate 325 (65 FE) MG tablet Take 1 tablet (325 mg total) by mouth 2 (two) times daily with a meal. 30 tablet 2   folic acid (FOLVITE) 1 MG tablet Take 1 tablet (1 mg  total) by mouth daily. 90 tablet 0   norethindrone-ethinyl estradiol (LOESTRIN) 1-20 MG-MCG tablet Take 1 tablet by mouth daily.     No current facility-administered medications for this visit.    REVIEW OF SYSTEMS:   Constitutional: ( - ) fevers, ( - )  chills , ( - ) night sweats Eyes: ( - ) blurriness of vision, ( - ) double vision, ( - ) watery eyes Ears, nose, mouth, throat, and face: ( - ) mucositis, ( - ) sore throat Respiratory: ( - ) cough, (- ) dyspnea, ( - ) wheezes Cardiovascular: ( - ) palpitation, ( - ) chest discomfort, ( - ) lower extremity swelling Gastrointestinal:  ( - ) nausea, ( - ) heartburn, ( - ) change in bowel habits Skin: ( - ) abnormal skin rashes Lymphatics: ( - ) new lymphadenopathy, ( - ) easy bruising Neurological: ( - ) numbness, ( - ) tingling, ( - ) new weaknesses Behavioral/Psych: ( - ) mood change, ( - ) new changes  All other systems were reviewed with the patient and are negative.  PHYSICAL EXAMINATION: ECOG PERFORMANCE STATUS: 0 - Asymptomatic  Vitals:   03/24/23 1225  BP: 137/79  Pulse: 92  Resp: 16  Temp: 98.7 F (37.1 C)  SpO2: 100%      Filed Weights   03/24/23 1225  Weight: 150 lb 12.8 oz (68.4 kg)       GENERAL: well appearing female in NAD  SKIN: skin color, texture, turgor are normal, no rashes or significant lesions EYES: conjunctiva are pink and non-injected,  sclera clear LUNGS: clear to auscultation and percussion with normal breathing effort HEART: regular rate & rhythm and no murmurs and no lower extremity edema Musculoskeletal: no cyanosis of digits and no clubbing  PSYCH: alert & oriented x 3, fluent speech NEURO: no focal motor/sensory deficits  LABORATORY DATA:  I have reviewed the data as listed    Latest Ref Rng & Units 03/16/2023    9:27 AM 03/11/2023   10:29 AM 01/04/2023   10:32 AM  CBC  WBC 4.0 - 10.5 K/uL 4.0  3.9  4.1   Hemoglobin 12.0 - 15.0 g/dL 46.9  62.9  52.8   Hematocrit 36.0 - 46.0 % 33.4  43.0  38.5   Platelets 150 - 400 K/uL 73  56.0  72        Latest Ref Rng & Units 03/16/2023    9:27 AM 03/11/2023   10:29 AM 01/04/2023   10:32 AM  CMP  Glucose 70 - 99 mg/dL 77  78  64   BUN 6 - 20 mg/dL 11  11  10    Creatinine 0.44 - 1.00 mg/dL 4.13  2.44  0.10   Sodium 135 - 145 mmol/L 140  137  141   Potassium 3.5 - 5.1 mmol/L 3.6  4.0  4.0   Chloride 98 - 111 mmol/L 112  106  111   CO2 22 - 32 mmol/L 25  24  25    Calcium 8.9 - 10.3 mg/dL 8.1  8.5  9.0   Total Protein 6.5 - 8.1 g/dL 6.1  6.7  6.8   Total Bilirubin 0.0 - 1.2 mg/dL 0.3  0.5  0.5   Alkaline Phos 38 - 126 U/L 42  44  53   AST 15 - 41 U/L 17  20  13    ALT 0 - 44 U/L 34  36  23    ASSESSMENT & PLAN Ziyonna Manville is  a 38 y.o. 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   #Iron deficiency anemia 2/2 heavy menstrual bleeding: --Received IV monoferric 1000 mg x 1 dose on 01/30/2021, last had Venofer in June/July 2024.  --Continue to incorporate iron rich foods into diet. --Currently on birth control and under the care of gynecologist --Labs today show white blood cell 4.0, hemoglobin 11.0, MCV 91.3, platelets 73.  Iron studies show ferritin of 19 --Currently on ferrous sulfate 325 mg once daily. Recommend to continue with a source of vitamin C. --Need to premedicate for future IV iron infusions as patient had  mild rash/pruritis with most recent infusion.  -- Plan to proceed with IV Feraheme with premedication. -- Return to clinic in 3 months time with labs in order to reassess.  #Thrombocytopenia, likely ITP: --Patient received steroids during her pregnancy in 2020 with improvement of platelet counts. Otherwise, she has not received additional therapy --Patient denies signs of bleeding except for menstrual cycle.  --Workup from 01/20/2021 showed no evidence of additional deficiencies, hepatitis B or C.  Immature platelet fraction was elevated to suggests ITP as underlying etiology. --labs today show Plt 73 --Continue to monitor with strict precautions for bleeding.  Follow up: --  Return to clinic in 3 months time with labs in order to reassess.  No orders of the defined types were placed in this encounter.   All questions were answered. The patient knows to call the clinic with any problems, questions or concerns.  I have spent a total of 30 minutes minutes of face-to-face and non-face-to-face time, preparing to see the patient, performing a medically appropriate examination, counseling and educating the patient, ordering tests, documenting clinical information in the electronic health record, and care coordination.   Ulysees Barns, MD Department of Hematology/Oncology [facility] Phone: [phone] Pager: [phone] Email: [email or web address]

## 2023-03-25 ENCOUNTER — Encounter: Payer: Self-pay | Admitting: Hematology and Oncology

## 2023-03-25 ENCOUNTER — Telehealth: Payer: Self-pay

## 2023-03-25 NOTE — Telephone Encounter (Signed)
 Dr. Leonides Schanz, patient will be scheduled as soon as possible.  Auth Submission: NO AUTH NEEDED Site of care: Site of care: [facility] Payer: [facility] Medicaid Medication & CPT/J Code(s) submitted: Feraheme (ferumoxytol) [id number] Route of submission (phone, fax, portal): portal Phone # Fax # Auth type: Buy/Bill PB Units/visits requested: 510mg  x 2 doses Reference number:  Approval from: 03/25/23 to 09/25/23   Confirmed on the [facility] portal that Feraheme does not need a prior authorization.

## 2023-03-31 ENCOUNTER — Ambulatory Visit

## 2023-03-31 VITALS — BP 113/75 | HR 90 | Temp 98.4°F | Resp 16 | Ht 62.0 in | Wt 150.4 lb

## 2023-03-31 DIAGNOSIS — D500 Iron deficiency anemia secondary to blood loss (chronic): Secondary | ICD-10-CM

## 2023-03-31 DIAGNOSIS — N920 Excessive and frequent menstruation with regular cycle: Secondary | ICD-10-CM

## 2023-03-31 MED ORDER — DIPHENHYDRAMINE HCL 25 MG PO CAPS: 25.0000 mg | ORAL_CAPSULE | Freq: Once | ORAL | Status: DC

## 2023-03-31 MED ORDER — SODIUM CHLORIDE 0.9 % IV SOLN
510.0000 mg | Freq: Once | INTRAVENOUS | Status: AC
  Administered 2023-03-31: 510 mg via INTRAVENOUS
  Filled 2023-03-31: qty 17

## 2023-03-31 MED ORDER — ACETAMINOPHEN 325 MG PO TABS: 650.0000 mg | ORAL_TABLET | Freq: Once | ORAL | Status: DC

## 2023-03-31 NOTE — Progress Notes (Signed)
 Diagnosis: Iron Deficiency Anemia  Provider:  Chilton Greathouse MD  Procedure: IV Infusion  IV Type: Peripheral, IV Location: R Antecubital  Feraheme (Ferumoxytol), Dose: 510 mg  Infusion Start Time: 1126  Infusion Stop Time: 1143  Post Infusion IV Care: Observation period completed and Peripheral IV Discontinued  Discharge: Condition: Good, Destination: Home . AVS Declined  Performed by:  Loney Hering, LPN

## 2023-04-04 ENCOUNTER — Other Ambulatory Visit: Payer: BC Managed Care – PPO

## 2023-04-07 ENCOUNTER — Ambulatory Visit

## 2023-04-07 MED ORDER — ACETAMINOPHEN 325 MG PO TABS: 650.0000 mg | ORAL_TABLET | Freq: Once | ORAL | Status: DC

## 2023-04-07 MED ORDER — DIPHENHYDRAMINE HCL 25 MG PO CAPS: 25.0000 mg | ORAL_CAPSULE | Freq: Once | ORAL | Status: DC

## 2023-04-07 NOTE — Progress Notes (Signed)
 Pt came to office to get a Feraheme infusion and stated she has been itching on her bilateral arms and back. Small bumps and redness noted to bilateral arms and back. Cathy RN contacted Dr. Leonides Schanz and left a message about the pt reactions. Pt did not get her Feraheme infusion today.  Per Dr. Leonides Schanz, hold infusion and pt will be reevaluated in MD office.

## 2023-04-14 ENCOUNTER — Telehealth: Payer: Self-pay | Admitting: Hematology and Oncology

## 2023-04-15 ENCOUNTER — Inpatient Hospital Stay: Attending: Hematology and Oncology

## 2023-04-15 ENCOUNTER — Other Ambulatory Visit: Payer: Self-pay | Admitting: *Deleted

## 2023-04-15 DIAGNOSIS — D500 Iron deficiency anemia secondary to blood loss (chronic): Secondary | ICD-10-CM

## 2023-04-15 DIAGNOSIS — D693 Immune thrombocytopenic purpura: Secondary | ICD-10-CM | POA: Diagnosis present

## 2023-04-15 DIAGNOSIS — N920 Excessive and frequent menstruation with regular cycle: Secondary | ICD-10-CM | POA: Insufficient documentation

## 2023-04-15 LAB — CBC WITH DIFFERENTIAL (CANCER CENTER ONLY)
Abs Immature Granulocytes: 0.02 10*3/uL (ref 0.00–0.07)
Basophils Absolute: 0 10*3/uL (ref 0.0–0.1)
Basophils Relative: 0 %
Eosinophils Absolute: 0.1 10*3/uL (ref 0.0–0.5)
Eosinophils Relative: 2 %
HCT: 39.5 % (ref 36.0–46.0)
Hemoglobin: 12.7 g/dL (ref 12.0–15.0)
Immature Granulocytes: 0 %
Lymphocytes Relative: 34 %
Lymphs Abs: 2 10*3/uL (ref 0.7–4.0)
MCH: 29.8 pg (ref 26.0–34.0)
MCHC: 32.2 g/dL (ref 30.0–36.0)
MCV: 92.7 fL (ref 80.0–100.0)
Monocytes Absolute: 0.5 10*3/uL (ref 0.1–1.0)
Monocytes Relative: 8 %
Neutro Abs: 3.2 10*3/uL (ref 1.7–7.7)
Neutrophils Relative %: 56 %
Platelet Count: 105 10*3/uL — ABNORMAL LOW (ref 150–400)
RBC: 4.26 MIL/uL (ref 3.87–5.11)
RDW: 14.4 % (ref 11.5–15.5)
WBC Count: 5.8 10*3/uL (ref 4.0–10.5)
nRBC: 0 % (ref 0.0–0.2)

## 2023-04-15 LAB — RETIC PANEL
Immature Retic Fract: 12.1 % (ref 2.3–15.9)
RBC.: 4.13 MIL/uL (ref 3.87–5.11)
Retic Count, Absolute: 107.8 10*3/uL (ref 19.0–186.0)
Retic Ct Pct: 2.6 % (ref 0.4–3.1)
Reticulocyte Hemoglobin: 35.6 pg (ref >27.9)

## 2023-04-15 LAB — CMP (CANCER CENTER ONLY)
ALT: 33 U/L (ref 0–44)
AST: 18 U/L (ref 15–41)
Albumin: 4 g/dL (ref 3.5–5.0)
Alkaline Phosphatase: 49 U/L (ref 38–126)
Anion gap: 4 — ABNORMAL LOW (ref 5–15)
BUN: 11 mg/dL (ref 6–20)
CO2: 26 mmol/L (ref 22–32)
Calcium: 8.9 mg/dL (ref 8.9–10.3)
Chloride: 110 mmol/L (ref 98–111)
Creatinine: 0.84 mg/dL (ref 0.44–1.00)
GFR, Estimated: >60 mL/min (ref >60)
Glucose, Bld: 68 mg/dL — ABNORMAL LOW (ref 70–99)
Potassium: 4.1 mmol/L (ref 3.5–5.1)
Sodium: 140 mmol/L (ref 135–145)
Total Bilirubin: 0.5 mg/dL (ref 0.0–1.2)
Total Protein: 6.7 g/dL (ref 6.5–8.1)

## 2023-04-15 LAB — IRON AND IRON BINDING CAPACITY (CC-WL,HP ONLY)
Iron: 135 ug/dL (ref 28–170)
Saturation Ratios: 46 % — ABNORMAL HIGH (ref 10.4–31.8)
TIBC: 297 ug/dL (ref 250–450)
UIBC: 162 ug/dL (ref 148–442)

## 2023-04-15 LAB — FERRITIN: Ferritin: 216 ng/mL (ref 11–307)

## 2023-04-18 ENCOUNTER — Encounter: Payer: Self-pay | Admitting: Hematology and Oncology

## 2023-04-20 ENCOUNTER — Inpatient Hospital Stay (HOSPITAL_BASED_OUTPATIENT_CLINIC_OR_DEPARTMENT_OTHER): Admitting: Hematology and Oncology

## 2023-04-20 ENCOUNTER — Other Ambulatory Visit: Payer: Self-pay | Admitting: Hematology and Oncology

## 2023-04-20 ENCOUNTER — Encounter: Payer: Self-pay | Admitting: *Deleted

## 2023-04-20 VITALS — BP 132/66 | HR 102 | Temp 98.3°F | Resp 16 | Wt 151.0 lb

## 2023-04-20 DIAGNOSIS — D500 Iron deficiency anemia secondary to blood loss (chronic): Secondary | ICD-10-CM

## 2023-04-20 DIAGNOSIS — D696 Thrombocytopenia, unspecified: Secondary | ICD-10-CM

## 2023-04-20 NOTE — Progress Notes (Signed)
 [facility] Telephone:([phone]   Fax:(336) [phone]  PROGRESS NOTE  Patient Care Team: Nelwyn Salisbury, MD as PCP - General Leonides Schanz Thereasa Distance, MD as Consulting Physician (Hematology and Oncology)  Hematological History:  #Iron deficiency anemia: -Patient was seen by [facility] Hematology team from 2014-2020 with Dr. Gaylyn Rong and then Dr. Bertis Ruddy.  -07/27/2012: Received IV feraheme 1020 mg dose x 1. Developed hives several days later.  -Currently on oral iron 325 mg once daily -01/30/2021: Received IV monoferric 1000 mg x 1.   #Thrombocytopenia, likely ITP: ---Received steroid therapy during her pregnancy in 2020.    HISTORY OF PRESENTING ILLNESS:  Melinda [name] 38 y.o. female returns for follow-up for iron deficiency anemia and thrombocytopenia, likely ITP.  Patient was last seen in clinic on 03/24/2023.  In the interim she had a reaction to the IV iron therapy.    On exam today Melinda [name] reports with the exception of her iron infusion reaction she has been quite well.  She reports that Wednesday morning after the infusion she developed a rash from head to foot.  She reports it was all over her body.  She had been offered Benadryl after her infusion but did not take it.  She reports that this is the first time that she declined Benadryl.  She was having issues with iron in the past, most notably her first infusion, but when she takes Benadryl does not have as many problems.  She reports that she is not having any headache, nausea, Oni or diarrhea.  She reports that she has not had any trouble with her energy levels which are currently a 7 or 8 out of 10.  She reports that she is taking her iron pills difficulty not causing any constipation.  She is not having any lightheadedness, dizziness, shortness of breath.  She is not having any nosebleeds, gum bleeding, or dark stools.  She reports that her menstrual cycles have been regular and normal.  She reports today that she was able to  walk up the hill to the clinic rather than use the valet.. She denies fevers, chills, night sweats, shortness of breath, chest pain or cough.  She has no other complaints.  Rest of the 10 point ROS is below.  MEDICAL HISTORY:  Past Medical History:  Diagnosis Date   Anemia    Chickenpox    CHICKENPOX, HX OF 04/10/2007   Qualifier: Diagnosis of   By: Linna Darner, CMA, Cindy      Replacing diagnoses that were inactivated after the 04/12/22 regulatory import     Early satiety    Herpes genitalia    Herpes simplex infection in mother during third trimester of pregnancy 08/31/2018   Taking Valtrex 500 mg BID (started @ 36 wks)     HPV (human papilloma virus) anogenital infection    Iron deficiency anemia    Night sweat    Scarring, keloid    external genitalia   Thrombocytopenia (HCC)    Thrombocytopenia affecting pregnancy (HCC) 06/17/2018   Dx'd 2015 by hematologist; plan has included following/obs if remains above 50     Vaginal Pap smear, abnormal    Weight loss, non-intentional     SURGICAL HISTORY: Past Surgical History:  Procedure Laterality Date   CESAREAN SECTION N/A 09/20/2018   Procedure: CESAREAN SECTION;  Surgeon: Kathrynn Running, MD;  Location: [facility] LD [facility]S;  Service: Obstetrics;  Laterality: N/A;   WISDOM TOOTH EXTRACTION      SOCIAL HISTORY: Social  History   Socioeconomic History   Marital status: Single    Spouse name: Not on file   Number of children: 0   Years of education: Not on file   Highest education level: Master's degree (e.g., MA, MS, MEng, MEd, MSW, MBA)  Occupational History    Employer: [employer]    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[employer]; Runner, broadcasting/film/video  Tobacco Use   Smoking status: Never   Smokeless tobacco: Never  Vaping Use   Vaping status: Never Used  Substance and Sexual Activity   Alcohol use: No    Alcohol/week: 0.0 standard drinks of alcohol   Drug use: No   Sexual activity: Not Currently    Birth control/protection: None  Other  Topics Concern   Not on file  Social History Narrative   Not on file   Social Drivers of Health   Financial Resource Strain: Low Risk  (02/07/2023)   Overall Financial Resource Strain (CARDIA)    Difficulty of Paying Living Expenses: Not very hard  Food Insecurity: No Food Insecurity (02/07/2023)   Hunger Vital Sign    Worried About Running Out of Food in the Last Year: Never true    Ran Out of Food in the Last Year: Never true  Transportation Needs: No Transportation Needs (02/07/2023)   PRAPARE - Administrator, Civil Service (Medical): No    Lack of Transportation (Non-Medical): No  Physical Activity: Unknown (02/07/2023)   Exercise Vital Sign    Days of Exercise per Week: 0 days    Minutes of Exercise per Session: Not on file  Recent Concern: Physical Activity - Inactive (02/07/2023)   Exercise Vital Sign    Days of Exercise per Week: 0 days    Minutes of Exercise per Session: 30 min  Stress: Stress Concern Present (02/07/2023)   [employer] of Occupational Health - Occupational Stress Questionnaire    Feeling of Stress : Rather much  Social Connections: Socially Isolated (02/07/2023)   Social Connection and Isolation Panel [NHANES]    Frequency of Communication with Friends and Family: Once a week    Frequency of Social Gatherings with Friends and Family: Once a week    Attends Religious Services: More than 4 times per year    Active Member of [employer] or Organizations: No    Attends Banker Meetings: Not on file    Marital Status: Never married  Intimate Partner Violence: Not At Risk (12/16/2022)   Humiliation, Afraid, Rape, and Kick questionnaire    Fear of Current or Ex-Partner: No    Emotionally Abused: No    Physically Abused: No    Sexually Abused: No    FAMILY HISTORY: Family History  Problem Relation Age of Onset   Hypertension Maternal Grandmother    Diabetes Maternal Grandmother    Hypertension Mother    Other Father         Leukopenia.   Hypertension Maternal Uncle     ALLERGIES:  has no known allergies.  MEDICATIONS:  Current Outpatient Medications  Medication Sig Dispense Refill   acetaminophen (TYLENOL) 325 MG tablet Take 2 tablets (650 mg total) by mouth every 4 (four) hours as needed for up to 30 doses for mild pain. 30 tablet 1   ferrous sulfate 325 (65 FE) MG tablet Take 1 tablet (325 mg total) by mouth 2 (two) times daily with a meal. 30 tablet 2   folic acid (FOLVITE) 1 MG tablet Take 1 tablet (1 mg total) by mouth  daily. 90 tablet 0   norethindrone-ethinyl estradiol (LOESTRIN) 1-20 MG-MCG tablet Take 1 tablet by mouth daily.     No current facility-administered medications for this visit.    REVIEW OF SYSTEMS:   Constitutional: ( - ) fevers, ( - )  chills , ( - ) night sweats Eyes: ( - ) blurriness of vision, ( - ) double vision, ( - ) watery eyes Ears, nose, mouth, throat, and face: ( - ) mucositis, ( - ) sore throat Respiratory: ( - ) cough, (- ) dyspnea, ( - ) wheezes Cardiovascular: ( - ) palpitation, ( - ) chest discomfort, ( - ) lower extremity swelling Gastrointestinal:  ( - ) nausea, ( - ) heartburn, ( - ) change in bowel habits Skin: ( - ) abnormal skin rashes Lymphatics: ( - ) new lymphadenopathy, ( - ) easy bruising Neurological: ( - ) numbness, ( - ) tingling, ( - ) new weaknesses Behavioral/Psych: ( - ) mood change, ( - ) new changes  All other systems were reviewed with the patient and are negative.  PHYSICAL EXAMINATION: ECOG PERFORMANCE STATUS: 0 - Asymptomatic  Vitals:   04/20/23 0844  BP: 132/66  Pulse: (!) 102  Resp: 16  Temp: 98.3 F (36.8 C)  SpO2: 100%   Filed Weights   04/20/23 0844  Weight: 151 lb (68.5 kg)     GENERAL: well appearing female in NAD  SKIN: skin color, texture, turgor are normal, no rashes or significant lesions EYES: conjunctiva are pink and non-injected, sclera clear LUNGS: clear to auscultation and percussion with normal breathing  effort HEART: regular rate & rhythm and no murmurs and no lower extremity edema Musculoskeletal: no cyanosis of digits and no clubbing  PSYCH: alert & oriented x 3, fluent speech NEURO: no focal motor/sensory deficits  LABORATORY DATA:  I have reviewed the data as listed    Latest Ref Rng & Units 04/15/2023    1:54 PM 03/16/2023    9:27 AM 03/11/2023   10:29 AM  CBC  WBC 4.0 - 10.5 K/uL 5.8  4.0  3.9   Hemoglobin 12.0 - 15.0 g/dL 16.1  09.6  04.5   Hematocrit 36.0 - 46.0 % 39.5  33.4  43.0   Platelets 150 - 400 K/uL 105  73  56.0        Latest Ref Rng & Units 04/15/2023    1:54 PM 03/16/2023    9:27 AM 03/11/2023   10:29 AM  CMP  Glucose 70 - 99 mg/dL 68  77  78   BUN 6 - 20 mg/dL 11  11  11    Creatinine 0.44 - 1.00 mg/dL 4.09  8.11  9.14   Sodium 135 - 145 mmol/L 140  140  137   Potassium 3.5 - 5.1 mmol/L 4.1  3.6  4.0   Chloride 98 - 111 mmol/L 110  112  106   CO2 22 - 32 mmol/L 26  25  24    Calcium 8.9 - 10.3 mg/dL 8.9  8.1  8.5   Total Protein 6.5 - 8.1 g/dL 6.7  6.1  6.7   Total Bilirubin 0.0 - 1.2 mg/dL 0.5  0.3  0.5   Alkaline Phos 38 - 126 U/L 49  42  44   AST 15 - 41 U/L 18  17  20    ALT 0 - 44 U/L 33  34  36    ASSESSMENT & PLAN Melinda [name] is a 38 y.o. female who presents to  the clinic for follow up of iron deficiency anemia and presumed ITP.   #Iron deficiency anemia 2/2 heavy menstrual bleeding: --Received IV monoferric 1000 mg x 1 dose on 01/30/2021, last had Venofer in June/July 2024.  --Continue to incorporate iron rich foods into diet. --Currently on birth control and under the care of gynecologist --Labs today show white blood cell 5.8, hemoglobin 12.7, MCV 92.7, platelets 105.  Ferritin 216, iron sat 46%. --Currently on ferrous sulfate 325 mg once daily. Recommend to continue with a source of vitamin C. --Need to premedicate for future IV iron infusions as patient had mild rash/pruritis with most recent infusion.  -- Plan to proceed with IV Feraheme with  premedication if she requires IV iron again in the future.  Patient does not have infusion reactions been receiving Benadryl. -- Return to clinic in 3 months for labs and 6 months time with labs in order to reassess.  #Thrombocytopenia, likely ITP: --Patient received steroids during her pregnancy in 2020 with improvement of platelet counts. Otherwise, she has not received additional therapy --Patient denies signs of bleeding except for menstrual cycle.  --Workup from 01/20/2021 showed no evidence of additional deficiencies, hepatitis B or C.  Immature platelet fraction was elevated to suggests ITP as underlying etiology. --labs today show Plt 105 --Continue to monitor with strict precautions for bleeding.  Follow up: -- Return to clinic in 3 months for labs and 6 months time with labs in order to reassess.  No orders of the defined types were placed in this encounter.   All questions were answered. The patient knows to call the clinic with any problems, questions or concerns.  I have spent a total of 30 minutes minutes of face-to-face and non-face-to-face time, preparing to see the patient, performing a medically appropriate examination, counseling and educating the patient, ordering tests, documenting clinical information in the electronic health record, and care coordination.   Ulysees Barns, MD Department of Hematology/Oncology [facility] at [facility] Phone: [phone] Pager: [phone] Email: [email or web address]

## 2023-06-23 ENCOUNTER — Other Ambulatory Visit

## 2023-06-30 ENCOUNTER — Ambulatory Visit: Admitting: Hematology and Oncology

## 2023-07-04 ENCOUNTER — Inpatient Hospital Stay: Payer: BC Managed Care – PPO | Attending: Hematology and Oncology

## 2023-07-04 ENCOUNTER — Inpatient Hospital Stay (HOSPITAL_BASED_OUTPATIENT_CLINIC_OR_DEPARTMENT_OTHER): Payer: BC Managed Care – PPO | Admitting: Hematology and Oncology

## 2023-07-04 VITALS — BP 133/84 | HR 97 | Temp 99.2°F | Resp 16 | Wt 146.9 lb

## 2023-07-04 DIAGNOSIS — D509 Iron deficiency anemia, unspecified: Secondary | ICD-10-CM | POA: Insufficient documentation

## 2023-07-04 DIAGNOSIS — D500 Iron deficiency anemia secondary to blood loss (chronic): Secondary | ICD-10-CM | POA: Diagnosis present

## 2023-07-04 DIAGNOSIS — D693 Immune thrombocytopenic purpura: Secondary | ICD-10-CM | POA: Diagnosis present

## 2023-07-04 DIAGNOSIS — N920 Excessive and frequent menstruation with regular cycle: Secondary | ICD-10-CM | POA: Diagnosis present

## 2023-07-04 LAB — CBC WITH DIFFERENTIAL (CANCER CENTER ONLY)
Abs Immature Granulocytes: 0.02 10*3/uL (ref 0.00–0.07)
Basophils Absolute: 0 10*3/uL (ref 0.0–0.1)
Basophils Relative: 1 %
Eosinophils Absolute: 0.2 10*3/uL (ref 0.0–0.5)
Eosinophils Relative: 3 %
HCT: 37.2 % (ref 36.0–46.0)
Hemoglobin: 12.4 g/dL (ref 12.0–15.0)
Immature Granulocytes: 0 %
Lymphocytes Relative: 40 %
Lymphs Abs: 2 10*3/uL (ref 0.7–4.0)
MCH: 30.4 pg (ref 26.0–34.0)
MCHC: 33.3 g/dL (ref 30.0–36.0)
MCV: 91.2 fL (ref 80.0–100.0)
Monocytes Absolute: 0.4 10*3/uL (ref 0.1–1.0)
Monocytes Relative: 7 %
Neutro Abs: 2.5 10*3/uL (ref 1.7–7.7)
Neutrophils Relative %: 49 %
Platelet Count: 107 10*3/uL — ABNORMAL LOW (ref 150–400)
RBC: 4.08 MIL/uL (ref 3.87–5.11)
RDW: 12.9 % (ref 11.5–15.5)
WBC Count: 5.1 10*3/uL (ref 4.0–10.5)
nRBC: 0 % (ref 0.0–0.2)

## 2023-07-04 LAB — CMP (CANCER CENTER ONLY)
ALT: 15 U/L (ref 0–44)
AST: 15 U/L (ref 15–41)
Albumin: 4.1 g/dL (ref 3.5–5.0)
Alkaline Phosphatase: 52 U/L (ref 38–126)
Anion gap: 7 (ref 5–15)
BUN: 14 mg/dL (ref 6–20)
CO2: 25 mmol/L (ref 22–32)
Calcium: 8.8 mg/dL — ABNORMAL LOW (ref 8.9–10.3)
Chloride: 109 mmol/L (ref 98–111)
Creatinine: 0.94 mg/dL (ref 0.44–1.00)
GFR, Estimated: >60 mL/min (ref >60)
Glucose, Bld: 78 mg/dL (ref 70–99)
Potassium: 4.1 mmol/L (ref 3.5–5.1)
Sodium: 141 mmol/L (ref 135–145)
Total Bilirubin: 0.4 mg/dL (ref 0.0–1.2)
Total Protein: 7 g/dL (ref 6.5–8.1)

## 2023-07-04 LAB — RETIC PANEL
Immature Retic Fract: 13 % (ref 2.3–15.9)
RBC.: 4.15 MIL/uL (ref 3.87–5.11)
Retic Count, Absolute: 76.4 10*3/uL (ref 19.0–186.0)
Retic Ct Pct: 1.8 % (ref 0.4–3.1)
Reticulocyte Hemoglobin: 33.5 pg (ref >27.9)

## 2023-07-04 LAB — FERRITIN: Ferritin: 65 ng/mL (ref 11–307)

## 2023-07-04 LAB — IRON AND IRON BINDING CAPACITY (CC-WL,HP ONLY)
Iron: 62 ug/dL (ref 28–170)
Saturation Ratios: 18 % (ref 10.4–31.8)
TIBC: 342 ug/dL (ref 250–450)
UIBC: 280 ug/dL (ref 148–442)

## 2023-07-04 NOTE — Progress Notes (Signed)
 [facility] Telephone:(336) [phone]   Fax:(336) [phone]  PROGRESS NOTE  Patient Care Team: Johnny Garnette LABOR, MD as PCP - General Federico Norleen ONEIDA CLORE, MD as Consulting Physician (Hematology and Oncology)  Hematological History:  #Iron  deficiency anemia: -Patient was seen by [facility] Hematology team from 2014-2020 with Dr. Twana and then Dr. Lonn.  -07/27/2012: Received IV feraheme  1020 mg dose x 1. Developed hives several days later.  -Currently on oral iron  325 mg once daily -01/30/2021: Received IV monoferric  1000 mg x 1.   #Thrombocytopenia, likely ITP: ---Received steroid therapy during her pregnancy in 2020.    HISTORY OF PRESENTING ILLNESS:  Melinda [name] 38 y.o. female returns for follow-up for iron  deficiency anemia and thrombocytopenia, likely ITP.  Patient was last seen in clinic on 04/20/2023.  In the interim she has continued to struggle with heavy menstrual cycles.  On exam today Mrs. Taft reports she has been having difficulty with extremely heavy periods and is currently on 1 right now which is lasted 5 to 7 days.  She reports that she is currently taking a birth control called Mili which she does not think has been effective for her.  She reports overall her energy levels are okay.  She is been drinking a lot of beet juice, beet root, and has been eating steak/liver.  She reports he is also taking her iron  pills as prescribed.  She is not having any lightheadedness, dizziness, or shortness of breath.  She denies any ice cravings.  She is not having any bleeding elsewhere such as nosebleeds, gum bleeding, or dark stools.  She reports that she has had no recent illnesses such as fevers, chills, sweats, nausea, vomiting or diarrhea.  A full 10 point ROS is otherwise negative.  MEDICAL HISTORY:  Past Medical History:  Diagnosis Date   Anemia    Chickenpox    CHICKENPOX, HX OF 04/10/2007   Qualifier: Diagnosis of   By: Delos, CMA, Cindy      Replacing diagnoses that  were inactivated after the 04/12/22 regulatory import     Early satiety    Herpes genitalia    Herpes simplex infection in mother during third trimester of pregnancy 08/31/2018   Taking Valtrex  500 mg BID (started @ 36 wks)     HPV (human papilloma virus) anogenital infection    Iron  deficiency anemia    Night sweat    Scarring, keloid    external genitalia   Thrombocytopenia (HCC)    Thrombocytopenia affecting pregnancy (HCC) 06/17/2018   Dx'd 2015 by hematologist; plan has included following/obs if remains above 50     Vaginal Pap smear, abnormal    Weight loss, non-intentional     SURGICAL HISTORY: Past Surgical History:  Procedure Laterality Date   CESAREAN SECTION N/A 09/20/2018   Procedure: CESAREAN SECTION;  Surgeon: Kandis Devaughn Sayres, MD;  Location: [facility] LD ORS;  Service: Obstetrics;  Laterality: N/A;   WISDOM TOOTH EXTRACTION      SOCIAL HISTORY: Social History   Socioeconomic History   Marital status: Single    Spouse name: Not on file   Number of children: 0   Years of education: Not on file   Highest education level: Master's degree (e.g., MA, MS, MEng, MEd, MSW, MBA)  Occupational History    Employer: [employer]    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[employer]; Runner, broadcasting/film/video  Tobacco Use   Smoking status: Never   Smokeless tobacco: Never  Vaping Use   Vaping status: Never  Used  Substance and Sexual Activity   Alcohol use: No    Alcohol/week: 0.0 standard drinks of alcohol   Drug use: No   Sexual activity: Not Currently    Birth control/protection: None  Other Topics Concern   Not on file  Social History Narrative   Not on file   Social Drivers of Health   Financial Resource Strain: Low Risk  (02/07/2023)   Overall Financial Resource Strain (CARDIA)    Difficulty of Paying Living Expenses: Not very hard  Food Insecurity: No Food Insecurity (02/07/2023)   Hunger Vital Sign    Worried About Running Out of Food in the Last Year: Never true    Ran Out of Food  in the Last Year: Never true  Transportation Needs: No Transportation Needs (02/07/2023)   PRAPARE - Administrator, Civil Service (Medical): No    Lack of Transportation (Non-Medical): No  Physical Activity: Unknown (02/07/2023)   Exercise Vital Sign    Days of Exercise per Week: 0 days    Minutes of Exercise per Session: Not on file  Recent Concern: Physical Activity - Inactive (02/07/2023)   Exercise Vital Sign    Days of Exercise per Week: 0 days    Minutes of Exercise per Session: 30 min  Stress: Stress Concern Present (02/07/2023)   [employer] of Occupational Health - Occupational Stress Questionnaire    Feeling of Stress : Rather much  Social Connections: Socially Isolated (02/07/2023)   Social Connection and Isolation Panel    Frequency of Communication with Friends and Family: Once a week    Frequency of Social Gatherings with Friends and Family: Once a week    Attends Religious Services: More than 4 times per year    Active Member of [employer] or Organizations: No    Attends Banker Meetings: Not on file    Marital Status: Never married  Intimate Partner Violence: Not At Risk (12/16/2022)   Humiliation, Afraid, Rape, and Kick questionnaire    Fear of Current or Ex-Partner: No    Emotionally Abused: No    Physically Abused: No    Sexually Abused: No    FAMILY HISTORY: Family History  Problem Relation Age of Onset   Hypertension Maternal Grandmother    Diabetes Maternal Grandmother    Hypertension Mother    Other Father        Leukopenia.   Hypertension Maternal Uncle     ALLERGIES:  has no known allergies.  MEDICATIONS:  Current Outpatient Medications  Medication Sig Dispense Refill   acetaminophen  (TYLENOL ) 325 MG tablet Take 2 tablets (650 mg total) by mouth every 4 (four) hours as needed for up to 30 doses for mild pain. 30 tablet 1   ferrous sulfate  325 (65 FE) MG tablet Take 1 tablet (325 mg total) by mouth 2 (two) times daily with a  meal. 30 tablet 2   folic acid  (FOLVITE ) 1 MG tablet Take 1 tablet (1 mg total) by mouth daily. 90 tablet 0   norethindrone -ethinyl estradiol  (LOESTRIN ) 1-20 MG-MCG tablet Take 1 tablet by mouth daily.     No current facility-administered medications for this visit.    REVIEW OF SYSTEMS:   Constitutional: ( - ) fevers, ( - )  chills , ( - ) night sweats Eyes: ( - ) blurriness of vision, ( - ) double vision, ( - ) watery eyes Ears, nose, mouth, throat, and face: ( - ) mucositis, ( - ) sore throat Respiratory: ( - )  cough, (- ) dyspnea, ( - ) wheezes Cardiovascular: ( - ) palpitation, ( - ) chest discomfort, ( - ) lower extremity swelling Gastrointestinal:  ( - ) nausea, ( - ) heartburn, ( - ) change in bowel habits Skin: ( - ) abnormal skin rashes Lymphatics: ( - ) new lymphadenopathy, ( - ) easy bruising Neurological: ( - ) numbness, ( - ) tingling, ( - ) new weaknesses Behavioral/Psych: ( - ) mood change, ( - ) new changes  All other systems were reviewed with the patient and are negative.  PHYSICAL EXAMINATION: ECOG PERFORMANCE STATUS: 0 - Asymptomatic  There were no vitals filed for this visit.  There were no vitals filed for this visit.    GENERAL: well appearing female in NAD  SKIN: skin color, texture, turgor are normal, no rashes or significant lesions EYES: conjunctiva are pink and non-injected, sclera clear LUNGS: clear to auscultation and percussion with normal breathing effort HEART: regular rate & rhythm and no murmurs and no lower extremity edema Musculoskeletal: no cyanosis of digits and no clubbing  PSYCH: alert & oriented x 3, fluent speech NEURO: no focal motor/sensory deficits  LABORATORY DATA:  I have reviewed the data as listed    Latest Ref Rng & Units 04/15/2023    1:54 PM 03/16/2023    9:27 AM 03/11/2023   10:29 AM  CBC  WBC 4.0 - 10.5 K/uL 5.8  4.0  3.9   Hemoglobin 12.0 - 15.0 g/dL 87.2  88.9  85.8   Hematocrit 36.0 - 46.0 % 39.5  33.4  43.0    Platelets 150 - 400 K/uL 105  73  56.0        Latest Ref Rng & Units 04/15/2023    1:54 PM 03/16/2023    9:27 AM 03/11/2023   10:29 AM  CMP  Glucose 70 - 99 mg/dL 68  77  78   BUN 6 - 20 mg/dL 11  11  11    Creatinine 0.44 - 1.00 mg/dL 9.15  9.18  9.13   Sodium 135 - 145 mmol/L 140  140  137   Potassium 3.5 - 5.1 mmol/L 4.1  3.6  4.0   Chloride 98 - 111 mmol/L 110  112  106   CO2 22 - 32 mmol/L 26  25  24    Calcium 8.9 - 10.3 mg/dL 8.9  8.1  8.5   Total Protein 6.5 - 8.1 g/dL 6.7  6.1  6.7   Total Bilirubin 0.0 - 1.2 mg/dL 0.5  0.3  0.5   Alkaline Phos 38 - 126 U/L 49  42  44   AST 15 - 41 U/L 18  17  20    ALT 0 - 44 U/L 33  34  36    ASSESSMENT & PLAN Amiree Egolf is a 38 y.o. female who presents to the clinic for follow up of iron  deficiency anemia and presumed ITP.   #Iron  deficiency anemia 2/2 heavy menstrual bleeding: --Received IV monoferric  1000 mg x 1 dose on 01/30/2021, last had Venofer  in June/July 2024.  --Continue to incorporate iron  rich foods into diet. --Currently on birth control and under the care of gynecologist --Labs today show white blood cell 5.1, hemoglobin 12.4, MCV 91.2, platelets 107 --Currently on ferrous sulfate  325 mg once daily. Recommend to continue with a source of vitamin C. --Need to premedicate for future IV iron  infusions as patient had mild rash/pruritis with most recent infusion.  -- Plan to proceed with IV Feraheme   with premedication if she requires IV iron  again in the future.  Patient does not have infusion reactions been receiving Benadryl . -- Return to clinic in 3 months for labs and 6 months time with labs in order to reassess.  #Thrombocytopenia, likely ITP: --Patient received steroids during her pregnancy in 2020 with improvement of platelet counts. Otherwise, she has not received additional therapy --Patient denies signs of bleeding except for menstrual cycle.  --Workup from 01/20/2021 showed no evidence of additional deficiencies,  hepatitis B or C.  Immature platelet fraction was elevated to suggests ITP as underlying etiology. --labs today show Plt 105 --Continue to monitor with strict precautions for bleeding.  Follow up: -- Return to clinic in 3 months for labs and 6 months time with labs in order to reassess.  No orders of the defined types were placed in this encounter.   All questions were answered. The patient knows to call the clinic with any problems, questions or concerns.  I have spent a total of 30 minutes minutes of face-to-face and non-face-to-face time, preparing to see the patient, performing a medically appropriate examination, counseling and educating the patient, ordering tests, documenting clinical information in the electronic health record, and care coordination.   Norleen IVAR Kidney, MD Department of Hematology/Oncology [facility] at [facility] Phone: [phone] Pager: [phone] Email: [email or web address]

## 2023-07-20 ENCOUNTER — Inpatient Hospital Stay

## 2023-08-17 ENCOUNTER — Ambulatory Visit: Admitting: Family Medicine

## 2023-08-17 VITALS — BP 118/70 | HR 92 | Temp 99.0°F | Wt 149.4 lb

## 2023-08-17 DIAGNOSIS — N6314 Unspecified lump in the right breast, lower inner quadrant: Secondary | ICD-10-CM

## 2023-08-17 MED ORDER — DOXYCYCLINE HYCLATE 100 MG PO TABS: 100.0000 mg | ORAL_TABLET | Freq: Two times a day (BID) | ORAL | 0 refills | Status: DC

## 2023-08-17 NOTE — Progress Notes (Signed)
   Subjective:    Patient ID: Melinda [name], female    DOB: Oct 30, 1985, 38 y.o.   MRN: [id number]  HPI Here for 6 days of a painful lump on the right breast. This has been growing larger. She has had no fever. This is exactly the same type of thing that she had a little over a year ago at the same spot. At that time we sent her for a diagnostic mammogram and US  that revealed this to be a complex cyst. Currently she is applying heat with some relief.    Review of Systems  Constitutional: Negative.   Respiratory: Negative.    Cardiovascular: Negative.        Objective:   Physical Exam Constitutional:      Appearance: Normal appearance.  Cardiovascular:     Rate and Rhythm: Normal rate and regular rhythm.     Pulses: Normal pulses.     Heart sounds: Normal heart sounds.  Pulmonary:     Effort: Pulmonary effort is normal.     Breath sounds: Normal breath sounds.     Comments: There is a 4 cm area of the medial lower right breast that is swollen and very tender. No warmth or erythema. Nipple DC. The right axilla is clear  Neurological:     Mental Status: She is alert.           Assessment & Plan:  This is a boil on the breast, and we will treat with 10 days of Doxycycline . Set up another mammogram and US . Once we get the current infection taken care of, she is interested in seeing a surgeon to remove the cyst.  Garnette Olmsted, MD

## 2023-08-23 ENCOUNTER — Ambulatory Visit: Payer: Self-pay | Admitting: Family Medicine

## 2023-08-23 ENCOUNTER — Ambulatory Visit
Admission: RE | Admit: 2023-08-23 | Discharge: 2023-08-23 | Disposition: A | Discharge status: Home / Self Care | Source: Ambulatory Visit | Attending: Family Medicine | Admitting: Family Medicine

## 2023-08-23 DIAGNOSIS — N6314 Unspecified lump in the right breast, lower inner quadrant: Secondary | ICD-10-CM

## 2023-08-31 ENCOUNTER — Telehealth: Payer: Self-pay | Admitting: Hematology and Oncology

## 2023-09-26 ENCOUNTER — Other Ambulatory Visit: Payer: Self-pay | Admitting: Hematology and Oncology

## 2023-09-26 ENCOUNTER — Inpatient Hospital Stay: Attending: Hematology and Oncology

## 2023-09-26 ENCOUNTER — Inpatient Hospital Stay

## 2023-09-26 DIAGNOSIS — D500 Iron deficiency anemia secondary to blood loss (chronic): Secondary | ICD-10-CM | POA: Insufficient documentation

## 2023-09-26 DIAGNOSIS — D693 Immune thrombocytopenic purpura: Secondary | ICD-10-CM | POA: Diagnosis present

## 2023-09-26 DIAGNOSIS — N920 Excessive and frequent menstruation with regular cycle: Secondary | ICD-10-CM | POA: Insufficient documentation

## 2023-09-26 LAB — CBC WITH DIFFERENTIAL (CANCER CENTER ONLY)
Abs Immature Granulocytes: 0.01 K/uL (ref 0.00–0.07)
Basophils Absolute: 0 K/uL (ref 0.0–0.1)
Basophils Relative: 0 %
Eosinophils Absolute: 0.1 K/uL (ref 0.0–0.5)
Eosinophils Relative: 2 %
HCT: 41.1 % (ref 36.0–46.0)
Hemoglobin: 13 g/dL (ref 12.0–15.0)
Immature Granulocytes: 0 %
Lymphocytes Relative: 39 %
Lymphs Abs: 1.4 K/uL (ref 0.7–4.0)
MCH: 26.1 pg (ref 26.0–34.0)
MCHC: 31.6 g/dL (ref 30.0–36.0)
MCV: 82.4 fL (ref 80.0–100.0)
Monocytes Absolute: 0.3 K/uL (ref 0.1–1.0)
Monocytes Relative: 8 %
Neutro Abs: 1.8 K/uL (ref 1.7–7.7)
Neutrophils Relative %: 51 %
Platelet Count: 74 K/uL — ABNORMAL LOW (ref 150–400)
RBC: 4.99 MIL/uL (ref 3.87–5.11)
RDW: 17.1 % — ABNORMAL HIGH (ref 11.5–15.5)
WBC Count: 3.5 K/uL — ABNORMAL LOW (ref 4.0–10.5)
nRBC: 0 % (ref 0.0–0.2)

## 2023-09-26 LAB — CMP (CANCER CENTER ONLY)
ALT: 15 U/L (ref 0–44)
AST: 15 U/L (ref 15–41)
Albumin: 4.3 g/dL (ref 3.5–5.0)
Alkaline Phosphatase: 64 U/L (ref 38–126)
Anion gap: 5 (ref 5–15)
BUN: 11 mg/dL (ref 6–20)
CO2: 24 mmol/L (ref 22–32)
Calcium: 9.1 mg/dL (ref 8.9–10.3)
Chloride: 110 mmol/L (ref 98–111)
Creatinine: 0.97 mg/dL (ref 0.44–1.00)
GFR, Estimated: >60 mL/min (ref >60)
Glucose, Bld: 74 mg/dL (ref 70–99)
Potassium: 4.2 mmol/L (ref 3.5–5.1)
Sodium: 139 mmol/L (ref 135–145)
Total Bilirubin: 0.5 mg/dL (ref 0.0–1.2)
Total Protein: 7.1 g/dL (ref 6.5–8.1)

## 2023-09-26 LAB — RETIC PANEL
Immature Retic Fract: 8.2 % (ref 2.3–15.9)
RBC.: 5.02 MIL/uL (ref 3.87–5.11)
Retic Count, Absolute: 39.2 K/uL (ref 19.0–186.0)
Retic Ct Pct: 0.8 % (ref 0.4–3.1)
Reticulocyte Hemoglobin: 31.7 pg (ref >27.9)

## 2023-09-26 LAB — IRON AND IRON BINDING CAPACITY (CC-WL,HP ONLY)
Iron: 203 ug/dL — ABNORMAL HIGH (ref 28–170)
Saturation Ratios: 58 % — ABNORMAL HIGH (ref 10.4–31.8)
TIBC: 351 ug/dL (ref 250–450)
UIBC: 148 ug/dL (ref 148–442)

## 2023-09-26 LAB — FERRITIN: Ferritin: 57 ng/mL (ref 11–307)

## 2023-09-28 ENCOUNTER — Encounter: Payer: Self-pay | Admitting: Family Medicine

## 2023-09-28 ENCOUNTER — Ambulatory Visit: Payer: Self-pay | Admitting: *Deleted

## 2023-09-28 NOTE — Telephone Encounter (Signed)
 TCT patient with results per MD message. No answer - LVM that results available and message sent via MyChart.

## 2023-09-28 NOTE — Telephone Encounter (Signed)
-----   Message from Norleen ONEIDA Kidney IV sent at 09/27/2023 10:43 AM EDT ----- Please let Ms. Prevost know that her labs look stable.  Her iron  levels look good and her platelets are at baseline.  Will plan to see her back as scheduled in December 2025. ----- Message ----- From: Rebecka, Lab In [place] Sent: 09/26/2023   8:53 AM EDT To: Norleen ONEIDA Kidney [place], MD

## 2023-09-29 ENCOUNTER — Other Ambulatory Visit: Payer: Self-pay | Admitting: Family Medicine

## 2023-09-29 ENCOUNTER — Telehealth: Payer: Self-pay | Admitting: Family Medicine

## 2023-09-29 DIAGNOSIS — N6081 Other benign mammary dysplasias of right breast: Secondary | ICD-10-CM

## 2023-09-29 MED ORDER — DOXYCYCLINE HYCLATE 100 MG PO TABS
100.0000 mg | ORAL_TABLET | Freq: Two times a day (BID) | ORAL | 0 refills | Status: DC
Start: 2023-09-29 — End: 2023-11-28

## 2023-09-29 NOTE — Telephone Encounter (Signed)
 Pt notified via MyChart.

## 2023-09-29 NOTE — Telephone Encounter (Signed)
 Copied from CRM [id number]. Topic: Referral - Status >> Sep 29, 2023  8:19 AM Emylou G wrote: Reason for CRM: please call patient. checking status of referral to remove cyst.

## 2023-09-29 NOTE — Telephone Encounter (Unsigned)
 Copied from CRM [id number]. Topic: Clinical - Medication Refill >> Sep 29, 2023  8:18 AM Emylou G wrote: Medication: doxycycline  (VIBRA -TABS) 100 MG tablet  Has the patient contacted their pharmacy? No (Agent: If no, request that the patient contact the pharmacy for the refill. If patient does not wish to contact the pharmacy document the reason why and proceed with request.) (Agent: If yes, when and what did the pharmacy advise?)  This is the patient's preferred pharmacy:  [id number] [address], [place] - [address] AT [facility] [address] [place] [address] Phone: [phone] Fax: [phone]  Is this the correct pharmacy for this prescription? Yes If no, delete pharmacy and type the correct one.   Has the prescription been filled recently? Yes  Is the patient out of the medication? Yes  Has the patient been seen for an appointment in the last year OR does the patient have an upcoming appointment? Yes  Can we respond through MyChart? Yes  Agent: Please be advised that Rx refills may take up to 3 business days. We ask that you follow-up with your pharmacy.

## 2023-09-29 NOTE — Telephone Encounter (Signed)
 Spoke with pt this morning, states that she had both Mammogram and Breast US  done, pt wants to know of the next step and if needs surgery for Cyst removal she will need a referral placed. States that she needs another round of antibiotic due to pain. Please advise

## 2023-09-29 NOTE — Telephone Encounter (Signed)
 I sent in another round of Doxycycline , and I referred her to Surgery

## 2023-10-20 ENCOUNTER — Inpatient Hospital Stay

## 2023-10-27 ENCOUNTER — Ambulatory Visit: Payer: Self-pay | Admitting: General Surgery

## 2023-10-27 ENCOUNTER — Inpatient Hospital Stay: Admitting: Hematology and Oncology

## 2023-11-21 ENCOUNTER — Encounter (HOSPITAL_BASED_OUTPATIENT_CLINIC_OR_DEPARTMENT_OTHER): Payer: Self-pay | Admitting: General Surgery

## 2023-11-21 ENCOUNTER — Other Ambulatory Visit: Payer: Self-pay

## 2023-11-21 NOTE — Progress Notes (Signed)
   11/21/23 1411  PAT Phone Screen  Is the patient taking a GLP-1 receptor agonist? No  Do You Have Diabetes? No  Do You Have Hypertension? No  Have You Ever Been to the ER for Asthma? No  Have You Taken Oral Steroids in the Past 3 Months? No  Do you Take Phenteramine or any Other Diet Drugs? No  Recent  Lab Work, EKG, CXR? No  Do you have a history of heart problems? No  Any Recent Hospitalizations? No  Height 5' 2 (1.575 m)  Weight 68 kg  Pat Appointment Scheduled (S)  Yes (ensure and CHG)

## 2023-11-22 MED ORDER — CHLORHEXIDINE GLUCONATE CLOTH 2 % EX PADS: 6.0000 | MEDICATED_PAD | Freq: Once | CUTANEOUS | Status: DC

## 2023-11-22 NOTE — Progress Notes (Signed)

## 2023-11-28 ENCOUNTER — Ambulatory Visit (HOSPITAL_BASED_OUTPATIENT_CLINIC_OR_DEPARTMENT_OTHER)
Admission: RE | Admit: 2023-11-28 | Discharge: 2023-11-28 | Disposition: A | Discharge status: Home / Self Care | Attending: General Surgery | Admitting: General Surgery

## 2023-11-28 ENCOUNTER — Other Ambulatory Visit: Payer: Self-pay

## 2023-11-28 ENCOUNTER — Ambulatory Visit (HOSPITAL_BASED_OUTPATIENT_CLINIC_OR_DEPARTMENT_OTHER): Admitting: Anesthesiology

## 2023-11-28 ENCOUNTER — Encounter (HOSPITAL_BASED_OUTPATIENT_CLINIC_OR_DEPARTMENT_OTHER): Payer: Self-pay | Admitting: General Surgery

## 2023-11-28 ENCOUNTER — Encounter (HOSPITAL_BASED_OUTPATIENT_CLINIC_OR_DEPARTMENT_OTHER)
Admission: RE | Disposition: A | Discharge status: Home / Self Care | Payer: Self-pay | Source: Home / Self Care | Attending: General Surgery

## 2023-11-28 DIAGNOSIS — N6081 Other benign mammary dysplasias of right breast: Secondary | ICD-10-CM | POA: Insufficient documentation

## 2023-11-28 DIAGNOSIS — L720 Epidermal cyst: Secondary | ICD-10-CM | POA: Insufficient documentation

## 2023-11-28 DIAGNOSIS — L723 Sebaceous cyst: Secondary | ICD-10-CM | POA: Diagnosis not present

## 2023-11-28 DIAGNOSIS — Z01818 Encounter for other preprocedural examination: Secondary | ICD-10-CM

## 2023-11-28 HISTORY — PX: BREAST CYST EXCISION: SHX579

## 2023-11-28 LAB — POCT PREGNANCY, URINE: Preg Test, Ur: NEGATIVE

## 2023-11-28 SURGERY — EXCISION, CYST, BREAST
Anesthesia: General | Site: Breast | Laterality: Right

## 2023-11-28 MED ORDER — BUPIVACAINE-EPINEPHRINE 0.25% -1:200000 IJ SOLN
INTRAMUSCULAR | Status: DC | PRN
Start: 2023-11-28 — End: 2023-11-28
  Administered 2023-11-28: 20 mL

## 2023-11-28 MED ORDER — LIDOCAINE HCL (CARDIAC) PF 100 MG/5ML IV SOSY
PREFILLED_SYRINGE | INTRAVENOUS | Status: DC | PRN
  Administered 2023-11-28: 60 mg via INTRAVENOUS

## 2023-11-28 MED ORDER — ONDANSETRON HCL 4 MG/2ML IJ SOLN
INTRAMUSCULAR | Status: DC | PRN
  Administered 2023-11-28: 4 mg via INTRAVENOUS

## 2023-11-28 MED ORDER — KETOROLAC TROMETHAMINE 30 MG/ML IJ SOLN: 30.0000 mg | Freq: Once | INTRAMUSCULAR | Status: DC | PRN

## 2023-11-28 MED ORDER — 0.9 % SODIUM CHLORIDE (POUR BTL) OPTIME
TOPICAL | Status: DC | PRN
  Administered 2023-11-28: 1000 mL

## 2023-11-28 MED ORDER — OXYCODONE HCL 5 MG PO TABS: 5.0000 mg | ORAL_TABLET | Freq: Once | ORAL | Status: DC | PRN

## 2023-11-28 MED ORDER — GABAPENTIN 100 MG PO CAPS
ORAL_CAPSULE | ORAL | Status: AC
Start: 2023-11-28 — End: 2023-11-28
  Filled 2023-11-28: qty 1

## 2023-11-28 MED ORDER — CEFAZOLIN SODIUM-DEXTROSE 2-4 GM/100ML-% IV SOLN
INTRAVENOUS | Status: AC
Start: 2023-11-28 — End: 2023-11-28
  Filled 2023-11-28: qty 100

## 2023-11-28 MED ORDER — ACETAMINOPHEN 500 MG PO TABS: 1000.0000 mg | ORAL_TABLET | ORAL | Status: AC

## 2023-11-28 MED ORDER — KETOROLAC TROMETHAMINE 30 MG/ML IJ SOLN
INTRAMUSCULAR | Status: AC
  Filled 2023-11-28: qty 1

## 2023-11-28 MED ORDER — CEFAZOLIN SODIUM-DEXTROSE 2-4 GM/100ML-% IV SOLN
2.0000 g | INTRAVENOUS | Status: AC
  Administered 2023-11-28: 2 g via INTRAVENOUS

## 2023-11-28 MED ORDER — ACETAMINOPHEN 500 MG PO TABS
1000.0000 mg | ORAL_TABLET | Freq: Once | ORAL | Status: AC
  Administered 2023-11-28: 1000 mg via ORAL

## 2023-11-28 MED ORDER — SCOPOLAMINE 1 MG/3DAYS TD PT72
MEDICATED_PATCH | TRANSDERMAL | Status: AC
  Filled 2023-11-28: qty 1

## 2023-11-28 MED ORDER — KETOROLAC TROMETHAMINE 30 MG/ML IJ SOLN
INTRAMUSCULAR | Status: DC | PRN
  Administered 2023-11-28: 30 mg via INTRAVENOUS

## 2023-11-28 MED ORDER — FENTANYL CITRATE (PF) 100 MCG/2ML IJ SOLN
INTRAMUSCULAR | Status: DC | PRN
  Administered 2023-11-28 (×2): 50 ug via INTRAVENOUS

## 2023-11-28 MED ORDER — OXYCODONE HCL 5 MG/5ML PO SOLN: 5.0000 mg | Freq: Once | ORAL | Status: DC | PRN

## 2023-11-28 MED ORDER — MIDAZOLAM HCL 5 MG/5ML IJ SOLN
INTRAMUSCULAR | Status: DC | PRN
  Administered 2023-11-28: 2 mg via INTRAVENOUS

## 2023-11-28 MED ORDER — ONDANSETRON HCL 4 MG/2ML IJ SOLN: 4.0000 mg | Freq: Once | INTRAMUSCULAR | Status: DC | PRN

## 2023-11-28 MED ORDER — BUPIVACAINE-EPINEPHRINE (PF) 0.25% -1:200000 IJ SOLN
INTRAMUSCULAR | Status: AC
  Filled 2023-11-28: qty 30

## 2023-11-28 MED ORDER — SCOPOLAMINE 1 MG/3DAYS TD PT72
1.0000 | MEDICATED_PATCH | TRANSDERMAL | Status: DC
  Administered 2023-11-28: 1 mg via TRANSDERMAL

## 2023-11-28 MED ORDER — FENTANYL CITRATE (PF) 100 MCG/2ML IJ SOLN
INTRAMUSCULAR | Status: AC
  Filled 2023-11-28: qty 2

## 2023-11-28 MED ORDER — DEXAMETHASONE SOD PHOSPHATE PF 10 MG/ML IJ SOLN
INTRAMUSCULAR | Status: DC | PRN
  Administered 2023-11-28: 10 mg via INTRAVENOUS

## 2023-11-28 MED ORDER — MIDAZOLAM HCL 2 MG/2ML IJ SOLN
INTRAMUSCULAR | Status: AC
  Filled 2023-11-28: qty 2

## 2023-11-28 MED ORDER — ONDANSETRON HCL 4 MG/2ML IJ SOLN
INTRAMUSCULAR | Status: AC
  Filled 2023-11-28: qty 2

## 2023-11-28 MED ORDER — DEXMEDETOMIDINE HCL IN NACL 80 MCG/20ML IV SOLN
INTRAVENOUS | Status: DC | PRN
  Administered 2023-11-28: 16 ug via INTRAVENOUS

## 2023-11-28 MED ORDER — PROPOFOL 10 MG/ML IV BOLUS
INTRAVENOUS | Status: DC | PRN
  Administered 2023-11-28: 200 mg via INTRAVENOUS
  Administered 2023-11-28: 50 mg via INTRAVENOUS

## 2023-11-28 MED ORDER — GABAPENTIN 100 MG PO CAPS
100.0000 mg | ORAL_CAPSULE | ORAL | Status: AC
  Administered 2023-11-28: 100 mg via ORAL

## 2023-11-28 MED ORDER — PROPOFOL 500 MG/50ML IV EMUL
INTRAVENOUS | Status: DC | PRN
  Administered 2023-11-28: 150 ug/kg/min via INTRAVENOUS

## 2023-11-28 MED ORDER — HYDROMORPHONE HCL 1 MG/ML IJ SOLN: 0.2500 mg | INTRAMUSCULAR | Status: DC | PRN

## 2023-11-28 MED ORDER — MEPERIDINE HCL 25 MG/ML IJ SOLN: 6.2500 mg | INTRAMUSCULAR | Status: DC | PRN

## 2023-11-28 MED ORDER — AMISULPRIDE (ANTIEMETIC) 5 MG/2ML IV SOLN: 10.0000 mg | Freq: Once | INTRAVENOUS | Status: DC | PRN

## 2023-11-28 MED ORDER — ACETAMINOPHEN 500 MG PO TABS
ORAL_TABLET | ORAL | Status: AC
  Filled 2023-11-28: qty 2

## 2023-11-28 MED ORDER — OXYCODONE HCL 5 MG PO TABS: 5.0000 mg | ORAL_TABLET | Freq: Four times a day (QID) | ORAL | 0 refills | Status: AC | PRN

## 2023-11-28 MED ORDER — PROPOFOL 10 MG/ML IV BOLUS
INTRAVENOUS | Status: AC
Start: 2023-11-28 — End: 2023-11-28
  Filled 2023-11-28: qty 20

## 2023-11-28 MED ORDER — LACTATED RINGERS IV SOLN: INTRAVENOUS | Status: DC

## 2023-11-28 SURGICAL SUPPLY — 29 items
BLADE SURG 15 STRL LF DISP TIS (BLADE) ×2 IMPLANT
CANISTER SUCT 1200ML W/VALVE (MISCELLANEOUS) ×2 IMPLANT
CHLORAPREP W/TINT 26 (MISCELLANEOUS) ×2 IMPLANT
CLIP APPLIE 9.375 MED OPEN (MISCELLANEOUS) IMPLANT
COVER BACK TABLE 60X90IN (DRAPES) ×2 IMPLANT
COVER MAYO STAND STRL (DRAPES) ×2 IMPLANT
DERMABOND ADVANCED .7 DNX12 (GAUZE/BANDAGES/DRESSINGS) ×2 IMPLANT
DRAPE LAPAROSCOPIC ABDOMINAL (DRAPES) ×2 IMPLANT
DRAPE UTILITY XL STRL (DRAPES) ×2 IMPLANT
ELECT COATED BLADE 2.86 ST (ELECTRODE) ×2 IMPLANT
ELECTRODE REM PT RTRN 9FT ADLT (ELECTROSURGICAL) ×2 IMPLANT
GLOVE BIO SURGEON STRL SZ7.5 (GLOVE) ×2 IMPLANT
GOWN STRL REUS W/ TWL LRG LVL3 (GOWN DISPOSABLE) ×4 IMPLANT
NDL HYPO 25X1 1.5 SAFETY (NEEDLE) ×2 IMPLANT
NEEDLE HYPO 25X1 1.5 SAFETY (NEEDLE) ×1 IMPLANT
PACK BASIN DAY SURGERY FS (CUSTOM PROCEDURE TRAY) ×2 IMPLANT
PENCIL SMOKE EVACUATOR (MISCELLANEOUS) ×2 IMPLANT
SLEEVE SCD COMPRESS KNEE MED (STOCKING) ×2 IMPLANT
SOLN 0.9% NACL POUR BTL 1000ML (IV SOLUTION) ×2 IMPLANT
SPIKE FLUID TRANSFER (MISCELLANEOUS) IMPLANT
SPONGE T-LAP 18X18 ~~LOC~~+RFID (SPONGE) ×2 IMPLANT
SUT MON AB 4-0 PC3 18 (SUTURE) ×2 IMPLANT
SUT SILK 2 0 SH (SUTURE) ×2 IMPLANT
SUT VICRYL 3-0 CR8 SH (SUTURE) ×2 IMPLANT
SYR CONTROL 10ML LL (SYRINGE) ×2 IMPLANT
TOWEL GREEN STERILE FF (TOWEL DISPOSABLE) ×2 IMPLANT
TRAY FAXITRON CT DISP (TRAY / TRAY PROCEDURE) IMPLANT
TUBE CONNECTING 20X1/4 (TUBING) ×2 IMPLANT
YANKAUER SUCT BULB TIP NO VENT (SUCTIONS) ×2 IMPLANT

## 2023-11-28 NOTE — Interval H&P Note (Signed)
 History and Physical Interval Note:  11/28/2023 10:48 AM  Melinda [name]  has presented today for surgery, with the diagnosis of CYST RIGHT BREAST.  The various methods of treatment have been discussed with the patient and family. After consideration of risks, benefits and other options for treatment, the patient has consented to  Procedure(s) with comments: EXCISION, CYST, BREAST (Right) - EXCISION CYST RIGHT BREAST as a surgical intervention.  The patient's history has been reviewed, patient examined, no change in status, stable for surgery.  I have reviewed the patient's chart and labs.  Questions were answered to the patient's satisfaction.     Deward Null III

## 2023-11-28 NOTE — Op Note (Signed)
 11/28/2023  11:48 AM  PATIENT:  Melinda [name]  38 y.o. female  PRE-OPERATIVE DIAGNOSIS:  CYST RIGHT BREAST  POST-OPERATIVE DIAGNOSIS:  CYST RIGHT BREAST  PROCEDURE:  Procedure(s) with comments: EXCISION, CYST, BREAST (Right) - EXCISION CYST RIGHT BREAST  SURGEON:  Surgeons and Role:    DEWAINE Curvin Deward DOUGLAS, MD - Primary  PHYSICIAN ASSISTANT:   ASSISTANTS: none   ANESTHESIA:   local and general  EBL:  3 mL   BLOOD ADMINISTERED:none  DRAINS: none   LOCAL MEDICATIONS USED:  MARCAINE      SPECIMEN:  Source of Specimen:  right breast sebaceous cyst  DISPOSITION OF SPECIMEN:  PATHOLOGY  COUNTS:  YES  TOURNIQUET:  * No tourniquets in log *  DICTATION: .Dragon Dictation  After informed consent was obtained the patient was brought to the operating room placed in the supine position on the operating table.  After adequate induction of general anesthesia the patient's right breast was prepped with ChloraPrep, allowed to dry, and draped in usual sterile manner.  An appropriate timeout was performed.  The patient had a sebaceous cyst involving the skin and subcutaneous tissue of the right inframammary fold medially.  The area around this was infiltrated with quarter percent Marcaine .  An elliptical incision was made in the skin surrounding the cyst.  The incision was carried through the skin and subcutaneous tissue sharply with the electrocautery.  Dissection was then carried around the cyst into the subcutaneous tissue.  Once this dissection was complete the entire cyst was removed from the patient.  It was oriented with a short stitch on the superior surface and a long stitch on the lateral surface.  It was sent to pathology for further evaluation.  Hemostasis was achieved using the Bovie electrocautery.  The wound was irrigated with saline and infiltrated with more quarter percent Marcaine .  The incision was then closed with interrupted 4-0 Monocryl subcuticular stitches.  Dermabond  dressings were applied.  The patient tolerated the procedure well.  At the end of the case all needle sponge and instrument counts were correct.  The patient was then awakened and taken recovery in stable condition.  PLAN OF CARE: Discharge to home after PACU  PATIENT DISPOSITION:  PACU - hemodynamically stable.   Delay start of Pharmacological VTE agent (>24hrs) due to surgical blood loss or risk of bleeding: not applicable

## 2023-11-28 NOTE — Discharge Instructions (Signed)
 Next dose of Tylenol  due anytime after 4:15 today if needed   Post Anesthesia Home Care Instructions  Activity: Get plenty of rest for the remainder of the day. A responsible individual must stay with you for 24 hours following the procedure.  For the next 24 hours, DO NOT: -Drive a car -Advertising copywriter -Drink alcoholic beverages -Take any medication unless instructed by your physician -Make any legal decisions or sign important papers.  Meals: Start with liquid foods such as gelatin or soup. Progress to regular foods as tolerated. Avoid greasy, spicy, heavy foods. If nausea and/or vomiting occur, drink only clear liquids until the nausea and/or vomiting subsides. Call your physician if vomiting continues.  Special Instructions/Symptoms: Your throat may feel dry or sore from the anesthesia or the breathing tube placed in your throat during surgery. If this causes discomfort, gargle with warm salt water . The discomfort should disappear within 24 hours.  If you had a scopolamine  patch placed behind your ear for the management of post- operative nausea and/or vomiting:  1. The medication in the patch is effective for 72 hours, after which it should be removed.  Wrap patch in a tissue and discard in the trash. Wash hands thoroughly with soap and water . 2. You may remove the patch earlier than 72 hours if you experience unpleasant side effects which may include dry mouth, dizziness or visual disturbances. 3. Avoid touching the patch. Wash your hands with soap and water  after contact with the patch.

## 2023-11-28 NOTE — Transfer of Care (Signed)
 Immediate Anesthesia Transfer of Care Note  Patient: Melinda [name]  Procedure(s) Performed: EXCISION, CYST, BREAST (Right: Breast)  Patient Location: PACU  Anesthesia Type:General  Level of Consciousness: drowsy, patient cooperative, and responds to stimulation  Airway & Oxygen Therapy: Patient Spontanous Breathing and Patient connected to face mask oxygen  Post-op Assessment: Report given to RN and Post -op Vital signs reviewed and stable  Post vital signs: Reviewed and stable  Last Vitals:  Vitals Value Taken Time  BP    Temp    Pulse 106 11/28/23 12:03  Resp    SpO2 100 % 11/28/23 12:03  Vitals shown include unfiled device data.  Last Pain:  Vitals:   11/28/23 1017  TempSrc: Temporal  PainSc: 0-No pain      Patients Stated Pain Goal: 4 (11/28/23 1017)  Complications: No notable events documented.

## 2023-11-28 NOTE — Anesthesia Postprocedure Evaluation (Signed)
 Anesthesia Post Note  Patient: Delayla Hoffmaster  Procedure(s) Performed: EXCISION, CYST, BREAST (Right: Breast)     Patient location during evaluation: Phase II Anesthesia Type: General Level of consciousness: awake and alert, oriented and patient cooperative Pain management: pain level controlled Vital Signs Assessment: post-procedure vital signs reviewed and stable Respiratory status: spontaneous breathing, nonlabored ventilation and respiratory function stable Cardiovascular status: blood pressure returned to baseline and stable Postop Assessment: no apparent nausea or vomiting Anesthetic complications: no   No notable events documented.  Last Vitals:  Vitals:   11/28/23 1245 11/28/23 1258  BP: 106/71 133/86  Pulse: 82 81  Resp: 19 18  Temp:  36.6 C  SpO2: 100% 98%    Last Pain:  Vitals:   11/28/23 1258  TempSrc:   PainSc: 0-No pain                 Almarie CHRISTELLA Marchi

## 2023-11-28 NOTE — Anesthesia Procedure Notes (Signed)
 Procedure Name: LMA Insertion Date/Time: 11/28/2023 11:21 AM  Performed by: Frost Kayla MATSU, CRNAPre-anesthesia Checklist: Patient identified, Emergency Drugs available, Suction available and Patient being monitored Patient Re-evaluated:Patient Re-evaluated prior to induction Oxygen Delivery Method: Circle system utilized Preoxygenation: Pre-oxygenation with 100% oxygen Induction Type: IV induction Ventilation: Mask ventilation without difficulty LMA: LMA inserted LMA Size: 4.0 Number of attempts: 1 Placement Confirmation: positive ETCO2 Tube secured with: Tape Dental Injury: Teeth and Oropharynx as per pre-operative assessment

## 2023-11-28 NOTE — H&P (Signed)
 REFERRING PHYSICIAN: Johnny Garnette Knee, MD PROVIDER: DEWARD GARNETTE NULL, MD MRN: [id number] DOB: Oct 17, 1985 Subjective   Chief Complaint: New Consultation (sebaceous cyst of right brst. )  History of Present Illness: Melinda [name] is a 38 y.o. female who is seen today as an office consultation for evaluation of New Consultation (sebaceous cyst of right brst. )  We are asked to see the patient in consultation by Dr. Garnette Johnny to evaluate her for a cyst of the right breast. The patient is a 38 year old black female who first developed a painful swollen area in the inner lower right breast back in August. She was treated with antibiotics and it seemed to resolve. It came back again a couple weeks ago. She has had some drainage from the area when it is inflamed. She is otherwise in good health and does not smoke.  Review of Systems: A complete review of systems was obtained from the patient. I have reviewed this information and discussed as appropriate with the patient. See HPI as well for other ROS.  ROS   Medical History: Past Medical History:  Diagnosis Date  Anemia  Anxiety   Patient Active Problem List  Diagnosis  Sebaceous cyst of skin of right breast   History reviewed. No pertinent surgical history.   No Known Allergies  No current outpatient medications on file prior to visit.   No current facility-administered medications on file prior to visit.   History reviewed. No pertinent family history.   Social History   Tobacco Use  Smoking Status Never  Smokeless Tobacco Never    Social History   Socioeconomic History  Marital status: Single  Tobacco Use  Smoking status: Never  Smokeless tobacco: Never  Vaping Use  Vaping status: Unknown  Substance and Sexual Activity  Alcohol use: Never  Drug use: Never   Social Drivers of Corporate Investment Banker Strain: Low Risk (08/17/2023)  Received from [facility]  Overall Financial Resource Strain (CARDIA)  How  hard is it for you to pay for the very basics like food, housing, medical care, and heating?: Not very hard  Food Insecurity: No Food Insecurity (08/17/2023)  Received from [facility]  Hunger Vital Sign  Within the past 12 months, you worried that your food would run out before you got the money to buy more.: Never true  Within the past 12 months, the food you bought just didn't last and you didn't have money to get more.: Never true  Transportation Needs: No Transportation Needs (08/17/2023)  Received from [facility] - Transportation  In the past 12 months, has lack of transportation kept you from medical appointments or from getting medications?: No  In the past 12 months, has lack of transportation kept you from meetings, work, or from getting things needed for daily living?: No  Physical Activity: Insufficiently Active (08/17/2023)  Received from [facility]  Exercise Vital Sign  On average, how many days per week do you engage in moderate to strenuous exercise (like a brisk walk)?: 1 day  On average, how many minutes do you engage in exercise at this level?: 10 min  Stress: No Stress Concern Present (08/17/2023)  Received from [facility] - Occupational Stress Questionnaire  Do you feel stress - tense, restless, nervous, or anxious, or unable to sleep at night because your mind is troubled all the time - these days?: Only a little  Social Connections: Moderately Isolated (08/17/2023)  Received from  [place]  Social Connection and Isolation Panel  In a typical week, how many times do you talk on the phone with family, friends, or neighbors?: Three times a week  How often do you get together with friends or relatives?: Once a week  How often do you attend church or religious services?: More than 4 times per year  Do you belong to any clubs or organizations such as church groups, unions, fraternal or athletic groups, or school groups?: No   Are you married, widowed, divorced, separated, never married, or living with a partner?: Never married   Objective:   Vitals:  BP: (!) 142/85  Pulse: 72  Resp: 16  Temp: 36.8 C (98.3 F)  SpO2: 99%  Weight: 67.9 kg (149 lb 9.6 oz)  Height: 157.5 cm (5' 2)  PainSc: 3   Body mass index is 27.36 kg/m.  Physical Exam Vitals reviewed.  Constitutional:  General: She is not in acute distress. Appearance: Normal appearance.  HENT:  Head: Normocephalic and atraumatic.  Right Ear: External ear normal.  Left Ear: External ear normal.  Nose: Nose normal.  Mouth/Throat:  Mouth: Mucous membranes are moist.  Pharynx: Oropharynx is clear.  Eyes:  General: No scleral icterus. Extraocular Movements: Extraocular movements intact.  Conjunctiva/sclera: Conjunctivae normal.  Pupils: Pupils are equal, round, and reactive to light.  Cardiovascular:  Rate and Rhythm: Normal rate and regular rhythm.  Pulses: Normal pulses.  Heart sounds: Normal heart sounds.  Pulmonary:  Effort: Pulmonary effort is normal. No respiratory distress.  Breath sounds: Normal breath sounds.  Abdominal:  General: Bowel sounds are normal.  Palpations: Abdomen is soft.  Tenderness: There is no abdominal tenderness.  Musculoskeletal:  General: No swelling, tenderness or deformity. Normal range of motion.  Cervical back: Normal range of motion and neck supple.  Skin: General: Skin is warm and dry.  Coloration: Skin is not jaundiced.  Neurological:  General: No focal deficit present.  Mental Status: She is alert and oriented to person, place, and time.  Psychiatric:  Mood and Affect: Mood normal.  Behavior: Behavior normal.     Breast: There is no palpable mass in either breast. There is no palpable axillary, supraclavicular, or cervical lymphadenopathy. There appears to be a sebaceous cyst in the lower inner right breast near the inframammary fold. There is no fluctuance associated with it today.  Labs,  Imaging and Diagnostic Testing:  Assessment and Plan:   Diagnoses and all orders for this visit:  Sebaceous cyst of skin of right breast - [facility] Case Posting Request; Future   The patient appears to have a sebaceous cyst in the lower inner right breast that has developed recurrent infections. Because of the risk of more infections I feel she would benefit from having this area excised. She would also like to have this done. I have discussed with her in detail the risks and benefits of the operation as well as some of the technical aspects and she understands and wishes to proceed. We will move forward with surgical scheduling.

## 2023-11-28 NOTE — Anesthesia Preprocedure Evaluation (Addendum)
 Anesthesia Evaluation  Patient identified by MRN, date of birth, ID band Patient awake    Reviewed: Allergy & Precautions, NPO status , Patient's Chart, lab work & pertinent test results  Airway Mallampati: I  TM Distance: >3 FB Neck ROM: Full    Dental  (+) Teeth Intact, Dental Advisory Given   Pulmonary neg pulmonary ROS   Pulmonary exam normal breath sounds clear to auscultation       Cardiovascular negative cardio ROS Normal cardiovascular exam Rhythm:Regular Rate:Normal     Neuro/Psych  PSYCHIATRIC DISORDERS Anxiety     negative neurological ROS     GI/Hepatic negative GI ROS, Neg liver ROS,,,  Endo/Other  negative endocrine ROS    Renal/GU negative Renal ROS  negative genitourinary   Musculoskeletal negative musculoskeletal ROS (+)    Abdominal   Peds  Hematology negative hematology ROS (+)   Anesthesia Other Findings   Reproductive/Obstetrics negative OB ROS Urine preg neg                               Anesthesia Physical Anesthesia Plan  ASA: 2  Anesthesia Plan: General   Post-op Pain Management: Tylenol  PO (pre-op)*   Induction: Intravenous  PONV Risk Score and Plan: 3 and Ondansetron , Dexamethasone , Midazolam and Treatment may vary due to age or medical condition  Airway Management Planned: LMA  Additional Equipment: None  Intra-op Plan:   Post-operative Plan: Extubation in OR  Informed Consent: I have reviewed the patients History and Physical, chart, labs and discussed the procedure including the risks, benefits and alternatives for the proposed anesthesia with the patient or authorized representative who has indicated his/her understanding and acceptance.     Dental advisory given  Plan Discussed with: CRNA  Anesthesia Plan Comments:          Anesthesia Quick Evaluation

## 2023-11-29 ENCOUNTER — Encounter (HOSPITAL_BASED_OUTPATIENT_CLINIC_OR_DEPARTMENT_OTHER): Payer: Self-pay | Admitting: General Surgery

## 2023-12-01 LAB — SURGICAL PATHOLOGY

## 2023-12-02 ENCOUNTER — Ambulatory Visit: Payer: Self-pay | Admitting: General Surgery

## 2023-12-13 ENCOUNTER — Ambulatory Visit: Admitting: Family Medicine

## 2023-12-13 ENCOUNTER — Encounter: Payer: Self-pay | Admitting: Family Medicine

## 2023-12-13 VITALS — BP 110/74 | HR 88 | Temp 98.7°F | Wt 153.2 lb

## 2023-12-13 DIAGNOSIS — B9689 Other specified bacterial agents as the cause of diseases classified elsewhere: Secondary | ICD-10-CM

## 2023-12-13 DIAGNOSIS — N760 Acute vaginitis: Secondary | ICD-10-CM | POA: Diagnosis not present

## 2023-12-13 MED ORDER — METRONIDAZOLE 500 MG PO TABS: 500.0000 mg | ORAL_TABLET | Freq: Three times a day (TID) | ORAL | 0 refills | Status: AC

## 2023-12-13 MED ORDER — VALACYCLOVIR HCL 500 MG PO TABS: 500.0000 mg | ORAL_TABLET | Freq: Two times a day (BID) | ORAL | 5 refills | Status: AC

## 2023-12-13 NOTE — Progress Notes (Signed)
   Subjective:    Patient ID: Melinda [name], female    DOB: 1985-02-06, 38 y.o.   MRN: [id number]  HPI Here with what she thinks is a bacterial vaginosis. She last had this a few years ago. For the past week she has had itching and burning in the vaginal area. No DC, but she has noticed an odor. The last time she had sex was about 6 weeks ago. She also asks for a refill on Valtrex  to keep around if needed.    Review of Systems  Constitutional: Negative.   Respiratory: Negative.    Cardiovascular: Negative.        Objective:   Physical Exam Constitutional:      Appearance: Normal appearance.  Cardiovascular:     Rate and Rhythm: Normal rate and regular rhythm.     Pulses: Normal pulses.     Heart sounds: Normal heart sounds.  Pulmonary:     Effort: Pulmonary effort is normal.     Breath sounds: Normal breath sounds.  Neurological:     Mental Status: She is alert.           Assessment & Plan:  BV, treat with 7 days of Metronidazole  500 mg TID. Garnette Olmsted, MD

## 2023-12-26 ENCOUNTER — Inpatient Hospital Stay: Admitting: Hematology and Oncology

## 2023-12-26 ENCOUNTER — Inpatient Hospital Stay

## 2023-12-29 ENCOUNTER — Inpatient Hospital Stay (HOSPITAL_BASED_OUTPATIENT_CLINIC_OR_DEPARTMENT_OTHER): Admitting: Hematology and Oncology

## 2023-12-29 ENCOUNTER — Telehealth: Payer: Self-pay | Admitting: Hematology and Oncology

## 2023-12-29 ENCOUNTER — Inpatient Hospital Stay

## 2023-12-29 ENCOUNTER — Other Ambulatory Visit: Payer: Self-pay | Admitting: Hematology and Oncology

## 2023-12-29 DIAGNOSIS — D500 Iron deficiency anemia secondary to blood loss (chronic): Secondary | ICD-10-CM

## 2023-12-29 NOTE — Progress Notes (Signed)
 Rescheduled

## 2023-12-29 NOTE — Telephone Encounter (Signed)
 I spoke with patient and she is scheduled for 01/02/2024 for lab and MD. Patient aware of date/time.

## 2023-12-30 ENCOUNTER — Telehealth: Admitting: Family Medicine

## 2023-12-30 ENCOUNTER — Encounter: Payer: Self-pay | Admitting: Family Medicine

## 2023-12-30 DIAGNOSIS — J019 Acute sinusitis, unspecified: Secondary | ICD-10-CM | POA: Diagnosis not present

## 2023-12-30 DIAGNOSIS — G43009 Migraine without aura, not intractable, without status migrainosus: Secondary | ICD-10-CM | POA: Diagnosis not present

## 2023-12-30 DIAGNOSIS — G43909 Migraine, unspecified, not intractable, without status migrainosus: Secondary | ICD-10-CM | POA: Insufficient documentation

## 2023-12-30 MED ORDER — AZITHROMYCIN 250 MG PO TABS: ORAL_TABLET | ORAL | 0 refills | Status: AC

## 2023-12-30 MED ORDER — ONDANSETRON HCL 8 MG PO TABS: 8.0000 mg | ORAL_TABLET | Freq: Four times a day (QID) | ORAL | 5 refills | Status: AC | PRN

## 2023-12-30 MED ORDER — SUMATRIPTAN SUCCINATE 100 MG PO TABS: 100.0000 mg | ORAL_TABLET | ORAL | 5 refills | Status: AC | PRN

## 2023-12-30 NOTE — Progress Notes (Signed)
 "  Subjective:    Patient ID: Melinda [name], female    DOB: 1985-06-19, 38 y.o.   MRN: [id number]  HPI Virtual Visit via Video Note  I connected with the patient on 12/30/2023 at  1:00 PM EST by a video enabled telemedicine application and verified that I am speaking with the correct person using two identifiers.  Location patient: home Location provider:work or home office Persons participating in the virtual visit: patient, provider  I discussed the limitations of evaluation and management by telemedicine and the availability of in person appointments. The patient expressed understanding and agreed to proceed.   HPI: Here for 2 days of sinus congestion, lots of PND, and nausea with vomiting. No fever or ST or cough. She has also had a migraine headache in the back of her head for 2 days. She averages 3 migraines a year, with each one lasting about 2 days. These cause her to vomit, and they make her very sensitive  to lights and sounds. She has never seen a doctor about these.    ROS: See pertinent positives and negatives per HPI.  Past Medical History:  Diagnosis Date   Anemia    Chickenpox    CHICKENPOX, HX OF 04/10/2007   Qualifier: Diagnosis of   By: Delos, CMA, Cindy      Replacing diagnoses that were inactivated after the 04/12/22 regulatory import     Early satiety    Herpes genitalia    Herpes simplex infection in mother during third trimester of pregnancy 08/31/2018   Taking Valtrex  500 mg BID (started @ 36 wks)     HPV (human papilloma virus) anogenital infection    Iron  deficiency anemia    Night sweat    Scarring, keloid    external genitalia   Thrombocytopenia    Thrombocytopenia affecting pregnancy 06/17/2018   Dx'd 2015 by hematologist; plan has included following/obs if remains above 50     Vaginal Pap smear, abnormal    Weight loss, non-intentional     Past Surgical History:  Procedure Laterality Date   BREAST CYST EXCISION Right 11/28/2023   Procedure:  EXCISION, CYST, BREAST;  Surgeon: Curvin Deward MOULD, MD;  Location: [facility];  Service: General;  Laterality: Right;  EXCISION CYST RIGHT BREAST   CESAREAN SECTION N/A 09/20/2018   Procedure: CESAREAN SECTION;  Surgeon: Kandis Devaughn Sayres, MD;  Location: [facility];  Service: Obstetrics;  Laterality: N/A;   WISDOM TOOTH EXTRACTION      Family History  Problem Relation Age of Onset   Hypertension Maternal Grandmother    Diabetes Maternal Grandmother    Hypertension Mother    Other Father        Leukopenia.   Hypertension Maternal Uncle     Current Medications[1]  EXAM:  VITALS per patient if applicable:  GENERAL: alert, oriented, appears well and in no acute distress  HEENT: atraumatic, conjunttiva clear, no obvious abnormalities on inspection of external nose and ears  NECK: normal movements of the head and neck  LUNGS: on inspection no signs of respiratory distress, breathing rate appears normal, no obvious gross SOB, gasping or wheezing  CV: no obvious cyanosis  MS: moves all visible extremities without noticeable abnormality  PSYCH/NEURO: pleasant and cooperative, no obvious depression or anxiety, speech and thought processing grossly intact  ASSESSMENT AND PLAN: She has a sinusitis which has also triggered a migraine. We will treat the sinusitis with a Zpack. For the migraine, she can take Sumatriptan  100  mg as needed. For the nausea, she can take Zofran  8 mg as needed.  Garnette Olmsted, MD  Discussed the following assessment and plan:  No diagnosis found.     I discussed the assessment and treatment plan with the patient. The patient was provided an opportunity to ask questions and all were answered. The patient agreed with the plan and demonstrated an understanding of the instructions.   The patient was advised to call back or seek an in-person evaluation if the symptoms worsen or if the condition fails to improve as anticipated.      Review of  Systems     Objective:   Physical Exam        Assessment & Plan:       [1]  Current Outpatient Medications:    acetaminophen  (TYLENOL ) 325 MG tablet, Take 2 tablets (650 mg total) by mouth every 4 (four) hours as needed for up to 30 doses for mild pain., Disp: 30 tablet, Rfl: 1   ferrous sulfate  325 (65 FE) MG tablet, Take 1 tablet (325 mg total) by mouth 2 (two) times daily with a meal., Disp: 30 tablet, Rfl: 2   folic acid  (FOLVITE ) 1 MG tablet, Take 1 tablet (1 mg total) by mouth daily., Disp: 90 tablet, Rfl: 0   metroNIDAZOLE  (FLAGYL ) 500 MG tablet, Take 1 tablet (500 mg total) by mouth 3 (three) times daily., Disp: 21 tablet, Rfl: 0   norethindrone -ethinyl estradiol  (LOESTRIN ) 1-20 MG-MCG tablet, Take 1 tablet by mouth daily., Disp: , Rfl:    oxyCODONE  (ROXICODONE ) 5 MG immediate release tablet, Take 1 tablet (5 mg total) by mouth every 6 (six) hours as needed. (Patient not taking: Reported on 12/13/2023), Disp: 10 tablet, Rfl: 0   valACYclovir  (VALTREX ) 500 MG tablet, Take 1 tablet (500 mg total) by mouth 2 (two) times daily., Disp: 14 tablet, Rfl: 5  "

## 2024-01-02 ENCOUNTER — Inpatient Hospital Stay

## 2024-01-02 ENCOUNTER — Inpatient Hospital Stay: Attending: Hematology and Oncology | Admitting: Hematology and Oncology

## 2024-01-02 VITALS — BP 123/75 | HR 94 | Temp 98.5°F | Resp 17 | Ht 62.0 in | Wt 149.0 lb

## 2024-01-02 DIAGNOSIS — N920 Excessive and frequent menstruation with regular cycle: Secondary | ICD-10-CM | POA: Insufficient documentation

## 2024-01-02 DIAGNOSIS — Z793 Long term (current) use of hormonal contraceptives: Secondary | ICD-10-CM | POA: Diagnosis not present

## 2024-01-02 DIAGNOSIS — D696 Thrombocytopenia, unspecified: Secondary | ICD-10-CM | POA: Diagnosis not present

## 2024-01-02 DIAGNOSIS — D500 Iron deficiency anemia secondary to blood loss (chronic): Secondary | ICD-10-CM | POA: Diagnosis not present

## 2024-01-02 DIAGNOSIS — Z79624 Long term (current) use of inhibitors of nucleotide synthesis: Secondary | ICD-10-CM | POA: Diagnosis not present

## 2024-01-02 DIAGNOSIS — D693 Immune thrombocytopenic purpura: Secondary | ICD-10-CM | POA: Insufficient documentation

## 2024-01-02 LAB — IRON AND IRON BINDING CAPACITY (CC-WL,HP ONLY)
Iron: 243 ug/dL — ABNORMAL HIGH (ref 28–170)
Saturation Ratios: 83 % — ABNORMAL HIGH (ref 10.4–31.8)
TIBC: 294 ug/dL (ref 250–450)
UIBC: 51 ug/dL

## 2024-01-02 LAB — CBC WITH DIFFERENTIAL (CANCER CENTER ONLY)
Abs Immature Granulocytes: 0.01 K/uL (ref 0.00–0.07)
Basophils Absolute: 0 K/uL (ref 0.0–0.1)
Basophils Relative: 1 %
Eosinophils Absolute: 0.1 K/uL (ref 0.0–0.5)
Eosinophils Relative: 2 %
HCT: 39.6 % (ref 36.0–46.0)
Hemoglobin: 13.2 g/dL (ref 12.0–15.0)
Immature Granulocytes: 0 %
Lymphocytes Relative: 42 %
Lymphs Abs: 1.6 K/uL (ref 0.7–4.0)
MCH: 29.9 pg (ref 26.0–34.0)
MCHC: 33.3 g/dL (ref 30.0–36.0)
MCV: 89.8 fL (ref 80.0–100.0)
Monocytes Absolute: 0.3 K/uL (ref 0.1–1.0)
Monocytes Relative: 9 %
Neutro Abs: 1.8 K/uL (ref 1.7–7.7)
Neutrophils Relative %: 46 %
Platelet Count: 85 K/uL — ABNORMAL LOW (ref 150–400)
RBC: 4.41 MIL/uL (ref 3.87–5.11)
RDW: 14 % (ref 11.5–15.5)
WBC Count: 3.8 K/uL — ABNORMAL LOW (ref 4.0–10.5)
nRBC: 0 % (ref 0.0–0.2)

## 2024-01-02 LAB — CMP (CANCER CENTER ONLY)
ALT: 45 U/L — ABNORMAL HIGH (ref 0–44)
AST: 34 U/L (ref 15–41)
Albumin: 4.3 g/dL (ref 3.5–5.0)
Alkaline Phosphatase: 69 U/L (ref 38–126)
Anion gap: 9 (ref 5–15)
BUN: 12 mg/dL (ref 6–20)
CO2: 27 mmol/L (ref 22–32)
Calcium: 9.1 mg/dL (ref 8.9–10.3)
Chloride: 105 mmol/L (ref 98–111)
Creatinine: 0.97 mg/dL (ref 0.44–1.00)
GFR, Estimated: >60 mL/min
Glucose, Bld: 70 mg/dL (ref 70–99)
Potassium: 4.1 mmol/L (ref 3.5–5.1)
Sodium: 140 mmol/L (ref 135–145)
Total Bilirubin: 0.5 mg/dL (ref 0.0–1.2)
Total Protein: 6.9 g/dL (ref 6.5–8.1)

## 2024-01-02 LAB — RETIC PANEL
Immature Retic Fract: 10 % (ref 2.3–15.9)
RBC.: 4.48 MIL/uL (ref 3.87–5.11)
Retic Count, Absolute: 80.2 K/uL (ref 19.0–186.0)
Retic Ct Pct: 1.8 % (ref 0.4–3.1)
Reticulocyte Hemoglobin: 32.1 pg

## 2024-01-02 LAB — FERRITIN: Ferritin: 132 ng/mL (ref 11–307)

## 2024-01-02 NOTE — Progress Notes (Signed)
 " [facility] [phone]   Fax:([phone]  PROGRESS NOTE  Patient Care Team: Johnny Garnette LABOR, MD as PCP - General Federico Norleen ONEIDA CLORE, MD as Consulting Physician (Hematology and Oncology)  Hematological History:  #Iron  deficiency anemia: -Patient was seen by [facility] Hematology team from 2014-2020 with Dr. Twana and then Dr. Lonn.  -07/27/2012: Received IV feraheme  1020 mg dose x 1. Developed hives several days later.  -Currently on oral iron  325 mg once daily -01/30/2021: Received IV monoferric  1000 mg x 1.   #Thrombocytopenia, likely ITP: ---Received steroid therapy during her pregnancy in 2020.    HISTORY OF PRESENTING ILLNESS:  Melinda [name] 38 y.o. female returns for follow-up for iron  deficiency anemia and thrombocytopenia, likely ITP.  Patient was last seen in clinic on 04/20/2023.  In the interim she has continued to struggle with heavy menstrual cycles.  On exam today Melinda [name] reports she has been well overall in the room since our last visit until this week.  She unfortunately has had about a 2-week congestion episode.  She reports that she is not having any sore throat or cough but is having some discharge.  It was heavier mucus but is now more clear and she is having some postnasal drip.  She reports there is also been some associated nausea with this.  She has been taking a Z-Pak and her symptoms have been improving.  She reports her energy levels have been poor and her appetite is not as good as she would like.  She reports her menstrual cycles are steady with the last 1 being in early December 2025 and was not particularly heavy.  She is currently taking her iron  pills as prescribed with orange juice but they do cause some occasional constipation.  She reports no other signs or symptoms concerning for bleeding such as nosebleeds, gum bleeding, or blood in the urine/stool.  Overall she feels well and has no additional questions concerns or complaints  today.  A full 10 point ROS is otherwise negative.  MEDICAL HISTORY:  Past Medical History:  Diagnosis Date   Anemia    Chickenpox    CHICKENPOX, HX OF 04/10/2007   Qualifier: Diagnosis of   By: Delos, CMA, Cindy      Replacing diagnoses that were inactivated after the 04/12/22 regulatory import     Early satiety    Herpes genitalia    Herpes simplex infection in mother during third trimester of pregnancy 08/31/2018   Taking Valtrex  500 mg BID (started @ 36 wks)     HPV (human papilloma virus) anogenital infection    Iron  deficiency anemia    Night sweat    Scarring, keloid    external genitalia   Thrombocytopenia    Thrombocytopenia affecting pregnancy 06/17/2018   Dx'd 2015 by hematologist; plan has included following/obs if remains above 50     Vaginal Pap smear, abnormal    Weight loss, non-intentional     SURGICAL HISTORY: Past Surgical History:  Procedure Laterality Date   BREAST CYST EXCISION Right 11/28/2023   Procedure: EXCISION, CYST, BREAST;  Surgeon: Curvin Deward MOULD, MD;  Location: [facility];  Service: General;  Laterality: Right;  EXCISION CYST RIGHT BREAST   CESAREAN SECTION N/A 09/20/2018   Procedure: CESAREAN SECTION;  Surgeon: Kandis Devaughn Sayres, MD;  Location: [facility];  Service: Obstetrics;  Laterality: N/A;   WISDOM TOOTH EXTRACTION      SOCIAL HISTORY: Social History   Socioeconomic History  Marital status: Single    Spouse name: Not on file   Number of children: 0   Years of education: Not on file   Highest education level: Master's degree (e.g., MA, MS, MEng, MEd, MSW, MBA)  Occupational History    Employer: [employer]    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[employer]; runner, broadcasting/film/video  Tobacco Use   Smoking status: Never   Smokeless tobacco: Never  Vaping Use   Vaping status: Never Used  Substance and Sexual Activity   Alcohol use: No    Alcohol/week: 0.0 standard drinks of alcohol   Drug use: No   Sexual activity: Not Currently     Birth control/protection: None  Other Topics Concern   Not on file  Social History Narrative   Not on file   Social Drivers of Health   Tobacco Use: Low Risk (12/30/2023)   Patient History    Smoking Tobacco Use: Never    Smokeless Tobacco Use: Never    Passive Exposure: Not on file  Financial Resource Strain: Low Risk (12/12/2023)   Overall Financial Resource Strain (CARDIA)    Difficulty of Paying Living Expenses: Not very hard  Food Insecurity: No Food Insecurity (12/12/2023)   Epic    Worried About Radiation Protection Practitioner of Food in the Last Year: Never true    Ran Out of Food in the Last Year: Never true  Transportation Needs: No Transportation Needs (12/12/2023)   Epic    Lack of Transportation (Medical): No    Lack of Transportation (Non-Medical): No  Physical Activity: Insufficiently Active (12/12/2023)   Exercise Vital Sign    Days of Exercise per Week: 1 day    Minutes of Exercise per Session: 10 min  Stress: No Stress Concern Present (12/12/2023)   [employer] of Occupational Health - Occupational Stress Questionnaire    Feeling of Stress: Only a little  Social Connections: Socially Isolated (12/12/2023)   Social Connection and Isolation Panel    Frequency of Communication with Friends and Family: Once a week    Frequency of Social Gatherings with Friends and Family: Once a week    Attends Religious Services: More than 4 times per year    Active Member of [employer] or Organizations: No    Attends Engineer, Structural: Not on file    Marital Status: Never married  Intimate Partner Violence: Not At Risk (12/16/2022)   Humiliation, Afraid, Rape, and Kick questionnaire    Fear of Current or Ex-Partner: No    Emotionally Abused: No    Physically Abused: No    Sexually Abused: No  Depression (PHQ2-9): Medium Risk (06/02/2022)   Depression (PHQ2-9)    PHQ-2 Score: 7  Alcohol Screen: Low Risk (12/12/2023)   Alcohol Screen    Last Alcohol Screening Score (AUDIT): 0   Housing: Low Risk (12/12/2023)   Epic    Unable to Pay for Housing in the Last Year: No    Number of Times Moved in the Last Year: 0    Homeless in the Last Year: No  Utilities: Patient Declined (12/16/2022)   [facility] Utilities    Threatened with loss of utilities: Patient declined  Health Literacy: Not on file    FAMILY HISTORY: Family History  Problem Relation Age of Onset   Hypertension Maternal Grandmother    Diabetes Maternal Grandmother    Hypertension Mother    Other Father        Leukopenia.   Hypertension Maternal Uncle     ALLERGIES:  has  no known allergies.  MEDICATIONS:  Current Outpatient Medications  Medication Sig Dispense Refill   acetaminophen  (TYLENOL ) 325 MG tablet Take 2 tablets (650 mg total) by mouth every 4 (four) hours as needed for up to 30 doses for mild pain. 30 tablet 1   azithromycin  (ZITHROMAX  Z-PAK) 250 MG tablet As directed 6 each 0   ferrous sulfate  325 (65 FE) MG tablet Take 1 tablet (325 mg total) by mouth 2 (two) times daily with a meal. 30 tablet 2   folic acid  (FOLVITE ) 1 MG tablet Take 1 tablet (1 mg total) by mouth daily. 90 tablet 0   metroNIDAZOLE  (FLAGYL ) 500 MG tablet Take 1 tablet (500 mg total) by mouth 3 (three) times daily. 21 tablet 0   norethindrone -ethinyl estradiol  (LOESTRIN ) 1-20 MG-MCG tablet Take 1 tablet by mouth daily.     ondansetron  (ZOFRAN ) 8 MG tablet Take 1 tablet (8 mg total) by mouth every 6 (six) hours as needed for nausea or vomiting. 30 tablet 5   oxyCODONE  (ROXICODONE ) 5 MG immediate release tablet Take 1 tablet (5 mg total) by mouth every 6 (six) hours as needed. 10 tablet 0   SUMAtriptan  (IMITREX ) 100 MG tablet Take 1 tablet (100 mg total) by mouth as needed for migraine. May repeat in 2 hours if headache persists or recurs. 10 tablet 5   valACYclovir  (VALTREX ) 500 MG tablet Take 1 tablet (500 mg total) by mouth 2 (two) times daily. 14 tablet 5   No current facility-administered medications for this visit.     REVIEW OF SYSTEMS:   Constitutional: ( - ) fevers, ( - )  chills , ( - ) night sweats Eyes: ( - ) blurriness of vision, ( - ) double vision, ( - ) watery eyes Ears, nose, mouth, throat, and face: ( - ) mucositis, ( - ) sore throat Respiratory: ( - ) cough, (- ) dyspnea, ( - ) wheezes Cardiovascular: ( - ) palpitation, ( - ) chest discomfort, ( - ) lower extremity swelling Gastrointestinal:  ( - ) nausea, ( - ) heartburn, ( - ) change in bowel habits Skin: ( - ) abnormal skin rashes Lymphatics: ( - ) new lymphadenopathy, ( - ) easy bruising Neurological: ( - ) numbness, ( - ) tingling, ( - ) new weaknesses Behavioral/Psych: ( - ) mood change, ( - ) new changes  All other systems were reviewed with the patient and are negative.  PHYSICAL EXAMINATION: ECOG PERFORMANCE STATUS: 0 - Asymptomatic  Vitals:   01/02/24 1015  BP: 123/75  Pulse: 94  Resp: 17  Temp: 98.5 F (36.9 C)  SpO2: 100%    Filed Weights   01/02/24 1015  Weight: 149 lb (67.6 kg)      GENERAL: well appearing female in NAD  SKIN: skin color, texture, turgor are normal, no rashes or significant lesions EYES: conjunctiva are pink and non-injected, sclera clear LUNGS: clear to auscultation and percussion with normal breathing effort HEART: regular rate & rhythm and no murmurs and no lower extremity edema Musculoskeletal: no cyanosis of digits and no clubbing  PSYCH: alert & oriented x 3, fluent speech NEURO: no focal motor/sensory deficits  LABORATORY DATA:  I have reviewed the data as listed    Latest Ref Rng & Units 01/02/2024    9:48 AM 09/26/2023    8:35 AM 07/04/2023    7:46 AM  CBC  WBC 4.0 - 10.5 K/uL 3.8  3.5  5.1   Hemoglobin 12.0 - 15.0 g/dL  13.2  13.0  12.4   Hematocrit 36.0 - 46.0 % 39.6  41.1  37.2   Platelets 150 - 400 K/uL 85  74  107        Latest Ref Rng & Units 01/02/2024    9:48 AM 09/26/2023    8:35 AM 07/04/2023    7:46 AM  CMP  Glucose 70 - 99 mg/dL 70  74  78   BUN 6 - 20  mg/dL 12  11  14    Creatinine 0.44 - 1.00 mg/dL 9.02  9.02  9.05   Sodium 135 - 145 mmol/L 140  139  141   Potassium 3.5 - 5.1 mmol/L 4.1  4.2  4.1   Chloride 98 - 111 mmol/L 105  110  109   CO2 22 - 32 mmol/L 27  24  25    Calcium 8.9 - 10.3 mg/dL 9.1  9.1  8.8   Total Protein 6.5 - 8.1 g/dL 6.9  7.1  7.0   Total Bilirubin 0.0 - 1.2 mg/dL 0.5  0.5  0.4   Alkaline Phos 38 - 126 U/L 69  64  52   AST 15 - 41 U/L 34  15  15   ALT 0 - 44 U/L 45  15  15    ASSESSMENT & PLAN Melinda [name] is a 38 y.o. female who presents to the clinic for follow up of iron  deficiency anemia and presumed ITP.   #Iron  deficiency anemia 2/2 heavy menstrual bleeding: --Received IV monoferric  1000 mg x 1 dose on 01/30/2021, last had Venofer  in June/July 2024.  --Continue to incorporate iron  rich foods into diet. --Currently on birth control and under the care of gynecologist --Labs today show white blood cell 3.8, hemoglobin 13.2, MCV 89.8, platelets 85 --Currently on ferrous sulfate  325 mg once daily. Recommend to continue with a source of vitamin C. --Need to premedicate for future IV iron  infusions as patient had mild rash/pruritis with most recent infusion.  -- Plan to proceed with IV Feraheme  with premedication if she requires IV iron  again in the future.  Patient had prior infusion reactions and has been receiving Benadryl . -- Return to clinic in 3 months for labs and 6 months time with labs in order to reassess.  #Thrombocytopenia, likely ITP: --Patient received steroids during her pregnancy in 2020 with improvement of platelet counts. Otherwise, she has not received additional therapy --Patient denies signs of bleeding except for menstrual cycle.  --Workup from 01/20/2021 showed no evidence of additional deficiencies, hepatitis B or C.  Immature platelet fraction was elevated to suggests ITP as underlying etiology. --labs today show Plt 85 --Continue to monitor with strict precautions for  bleeding.  Follow up: -- Return to clinic in 6 months time with labs in order to reassess.  No orders of the defined types were placed in this encounter.   All questions were answered. The patient knows to call the clinic with any problems, questions or concerns.  I have spent a total of 30 minutes minutes of face-to-face and non-face-to-face time, preparing to see the patient, performing a medically appropriate examination, counseling and educating the patient, ordering tests, documenting clinical information in the electronic health record, and care coordination.   Norleen IVAR Kidney, MD Department of Hematology/Oncology [facility] Phone: [phone] Pager: [phone] Email: [email or web address] "

## 2024-06-25 ENCOUNTER — Inpatient Hospital Stay

## 2024-07-02 ENCOUNTER — Inpatient Hospital Stay: Admitting: Hematology and Oncology
